# Patient Record
Sex: Male | Born: 1960 | Race: Black or African American | Hispanic: No | Marital: Single | Smoking: Never smoker
Health system: Southern US, Community
[De-identification: ages and names within clinical notes are randomized; demographics above are authoritative.]

## PROBLEM LIST (undated history)

## (undated) DIAGNOSIS — D649 Anemia, unspecified: Secondary | ICD-10-CM

## (undated) DIAGNOSIS — E099 Drug or chemical induced diabetes mellitus without complications: Secondary | ICD-10-CM

## (undated) DIAGNOSIS — Z8619 Personal history of other infectious and parasitic diseases: Secondary | ICD-10-CM

## (undated) DIAGNOSIS — M1990 Unspecified osteoarthritis, unspecified site: Secondary | ICD-10-CM

## (undated) DIAGNOSIS — M109 Gout, unspecified: Secondary | ICD-10-CM

## (undated) DIAGNOSIS — Z8719 Personal history of other diseases of the digestive system: Secondary | ICD-10-CM

## (undated) DIAGNOSIS — N185 Chronic kidney disease, stage 5: Secondary | ICD-10-CM

## (undated) DIAGNOSIS — N2889 Other specified disorders of kidney and ureter: Secondary | ICD-10-CM

## (undated) DIAGNOSIS — K219 Gastro-esophageal reflux disease without esophagitis: Secondary | ICD-10-CM

## (undated) DIAGNOSIS — C9000 Multiple myeloma not having achieved remission: Secondary | ICD-10-CM

## (undated) DIAGNOSIS — T380X5A Adverse effect of glucocorticoids and synthetic analogues, initial encounter: Secondary | ICD-10-CM

## (undated) DIAGNOSIS — Z872 Personal history of diseases of the skin and subcutaneous tissue: Secondary | ICD-10-CM

## (undated) DIAGNOSIS — N135 Crossing vessel and stricture of ureter without hydronephrosis: Secondary | ICD-10-CM

## (undated) DIAGNOSIS — I10 Essential (primary) hypertension: Secondary | ICD-10-CM

## (undated) DIAGNOSIS — I770 Arteriovenous fistula, acquired: Secondary | ICD-10-CM

## (undated) DIAGNOSIS — N289 Disorder of kidney and ureter, unspecified: Secondary | ICD-10-CM

## (undated) HISTORY — PX: LAPAROSCOPIC CHOLECYSTECTOMY: SUR755

## (undated) HISTORY — PX: BONE MARROW BIOPSY: SHX1253

## (undated) HISTORY — PX: DIALYSIS FISTULA CREATION: SHX611

---

## 1997-05-29 HISTORY — PX: KNEE ARTHROSCOPY: SUR90

## 1998-09-24 ENCOUNTER — Emergency Department (HOSPITAL_COMMUNITY): Admission: EM | Admit: 1998-09-24 | Discharge: 1998-09-24 | Payer: Self-pay | Admitting: Emergency Medicine

## 2007-05-26 ENCOUNTER — Ambulatory Visit: Payer: Self-pay | Admitting: Vascular Surgery

## 2007-05-26 ENCOUNTER — Ambulatory Visit: Payer: Self-pay | Admitting: Cardiology

## 2007-05-26 ENCOUNTER — Inpatient Hospital Stay (HOSPITAL_COMMUNITY): Admission: EM | Admit: 2007-05-26 | Discharge: 2007-06-24 | Payer: Self-pay | Admitting: Emergency Medicine

## 2007-05-26 ENCOUNTER — Encounter (INDEPENDENT_AMBULATORY_CARE_PROVIDER_SITE_OTHER): Payer: Self-pay | Admitting: Emergency Medicine

## 2007-05-26 ENCOUNTER — Ambulatory Visit: Payer: Self-pay | Admitting: Cardiovascular Disease

## 2007-05-27 ENCOUNTER — Ambulatory Visit: Payer: Self-pay | Admitting: Oncology

## 2007-05-29 ENCOUNTER — Encounter (INDEPENDENT_AMBULATORY_CARE_PROVIDER_SITE_OTHER): Payer: Self-pay | Admitting: Interventional Radiology

## 2007-05-29 ENCOUNTER — Encounter (INDEPENDENT_AMBULATORY_CARE_PROVIDER_SITE_OTHER): Payer: Self-pay | Admitting: Internal Medicine

## 2007-05-29 HISTORY — PX: TRANSTHORACIC ECHOCARDIOGRAM: SHX275

## 2007-06-03 ENCOUNTER — Ambulatory Visit: Payer: Self-pay | Admitting: Oncology

## 2007-06-07 ENCOUNTER — Other Ambulatory Visit: Payer: Self-pay | Admitting: Nephrology

## 2007-06-08 ENCOUNTER — Encounter: Payer: Self-pay | Admitting: Oncology

## 2007-06-18 ENCOUNTER — Ambulatory Visit: Payer: Self-pay | Admitting: Vascular Surgery

## 2007-06-18 ENCOUNTER — Ambulatory Visit: Payer: Self-pay | Admitting: Physical Medicine & Rehabilitation

## 2007-06-23 ENCOUNTER — Encounter: Payer: Self-pay | Admitting: Oncology

## 2007-06-24 ENCOUNTER — Inpatient Hospital Stay (HOSPITAL_COMMUNITY)
Admission: RE | Admit: 2007-06-24 | Discharge: 2007-07-22 | Payer: Self-pay | Admitting: Physical Medicine & Rehabilitation

## 2007-07-13 ENCOUNTER — Ambulatory Visit: Payer: Self-pay | Admitting: Hematology & Oncology

## 2009-06-15 IMAGING — US US RENAL
2 series · 14 of 25 positions shown · non-contrast
Comparison: 05/28/2007

CLINICAL DATA: Anemia. Hematuria. Lung mass. Multiple myeloma.

RENAL/URINARY TRACT ULTRASOUND
TECHNIQUE: Complete ultrasound examination of the urinary tract was performed
including evaluation of the kidneys, renal collecting systems, and urinary
bladder.

[Series 1: unknown · 0.40mm/px · 13 of 37 slices shown (1 of 2)]
[im 1/37]
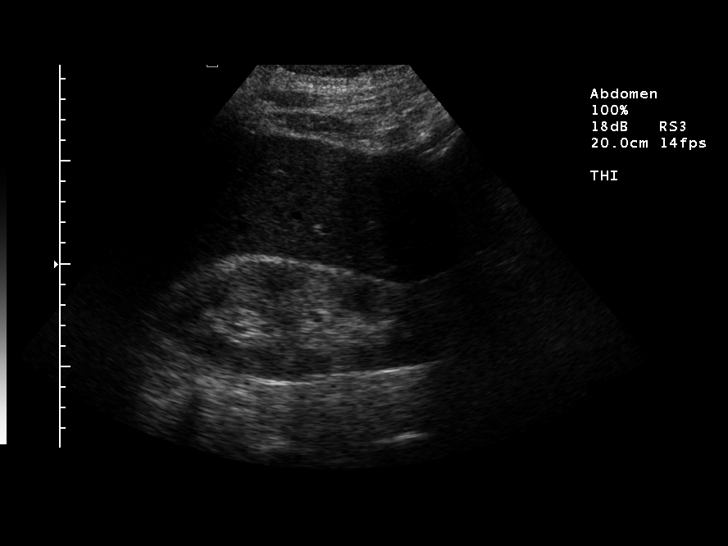
[im 4/37]
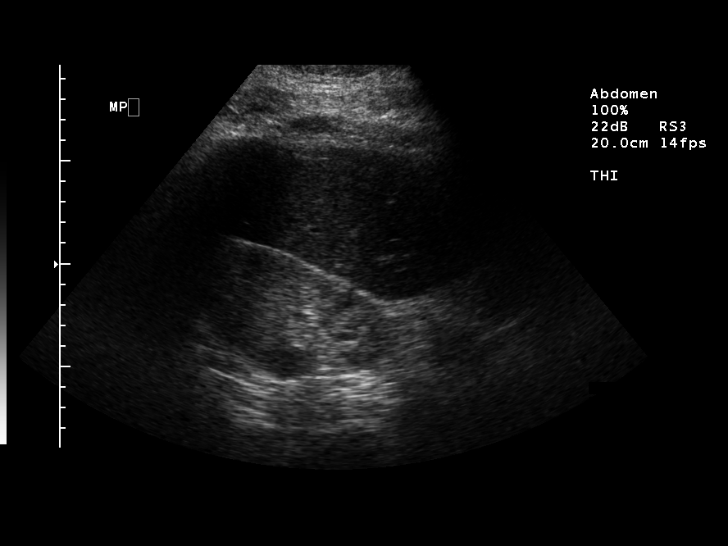
[im 7/37]
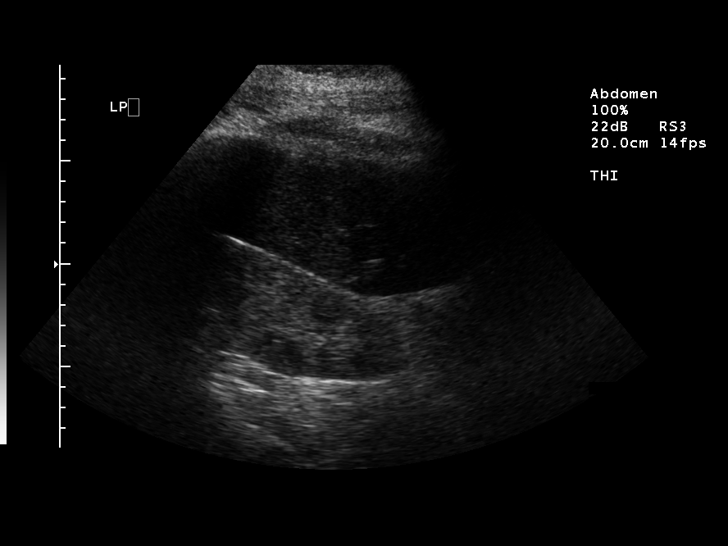
[im 10/37]
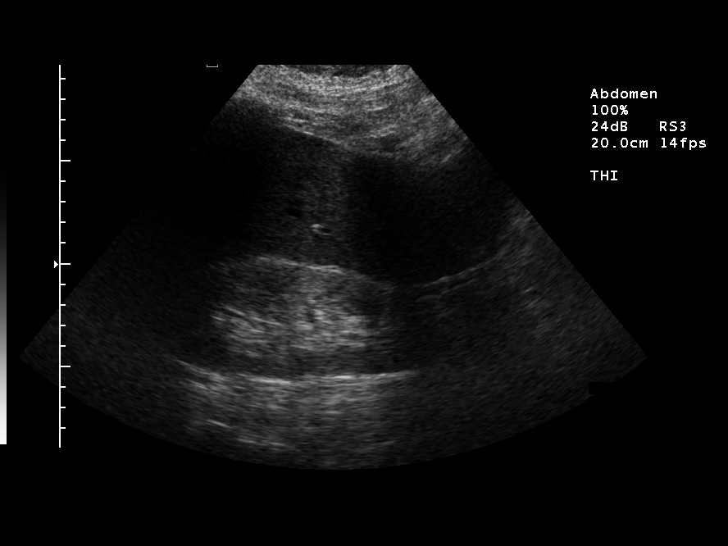
[im 14/37]
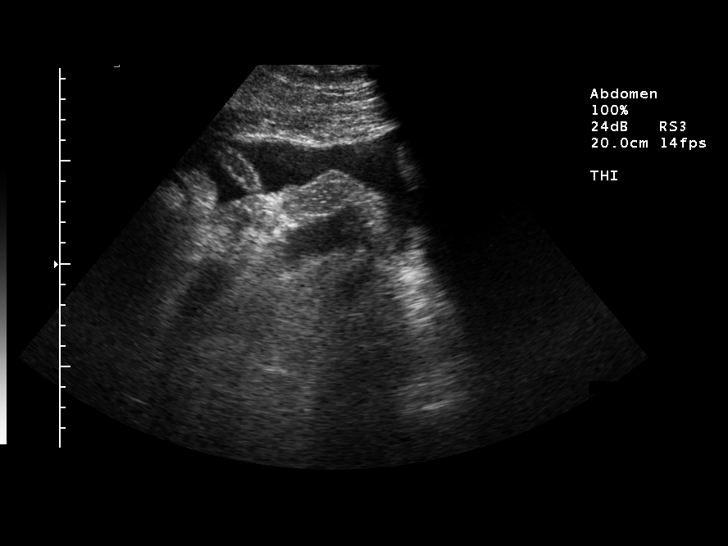
[im 15/37]
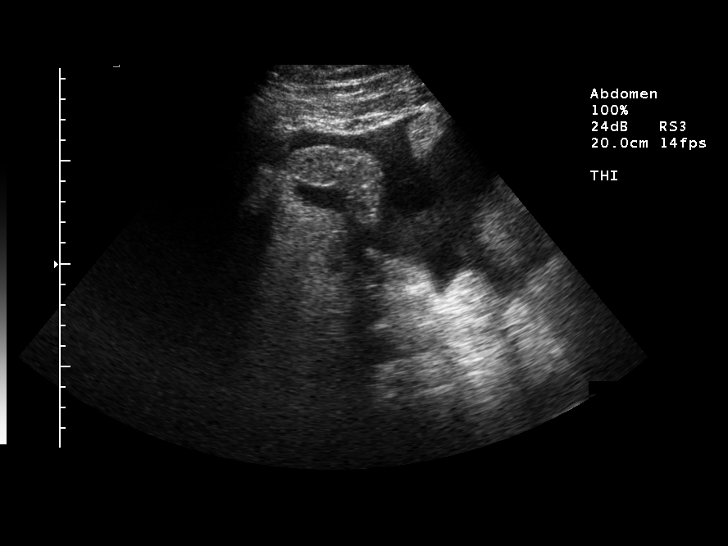
[im 19/37]
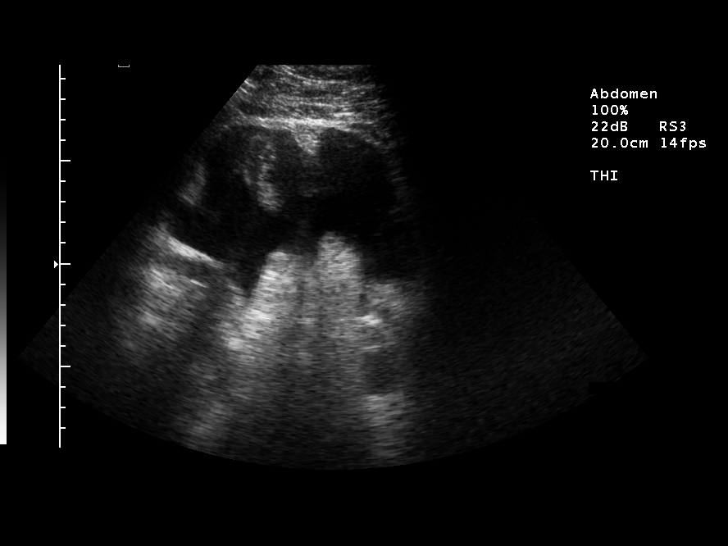
[im 22/37]
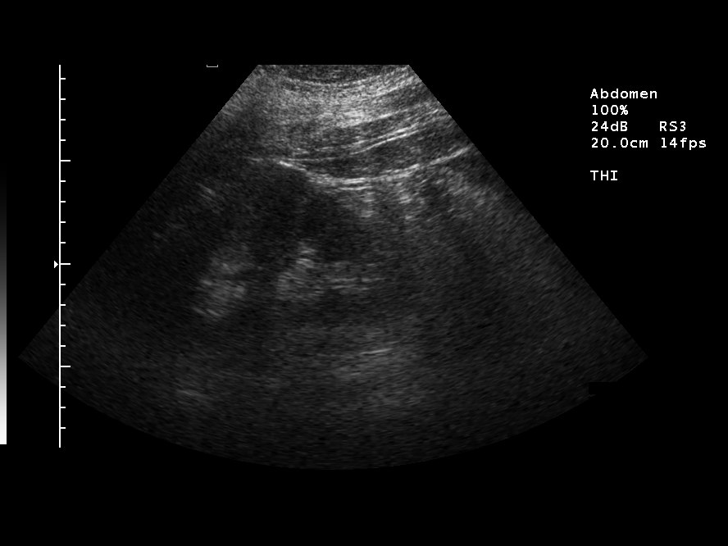
[im 25/37]
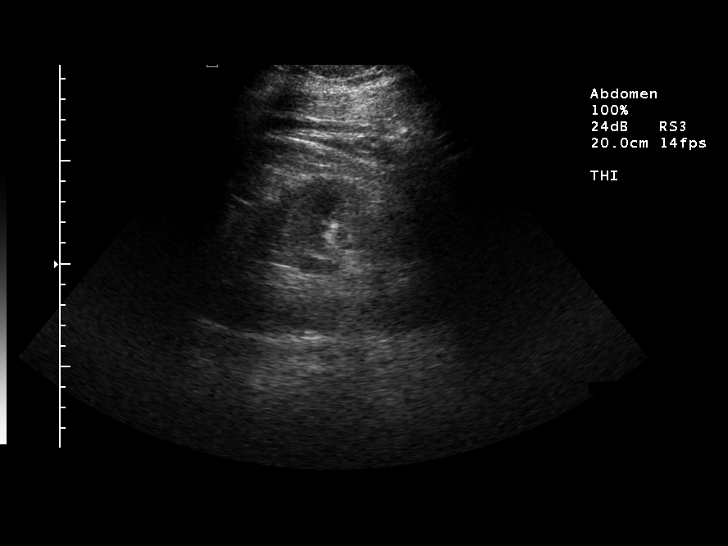
[im 27/37]
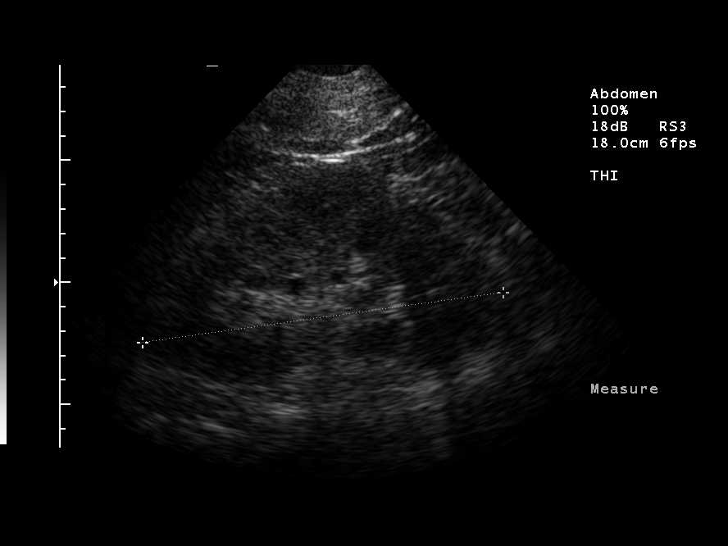
[im 30/37]
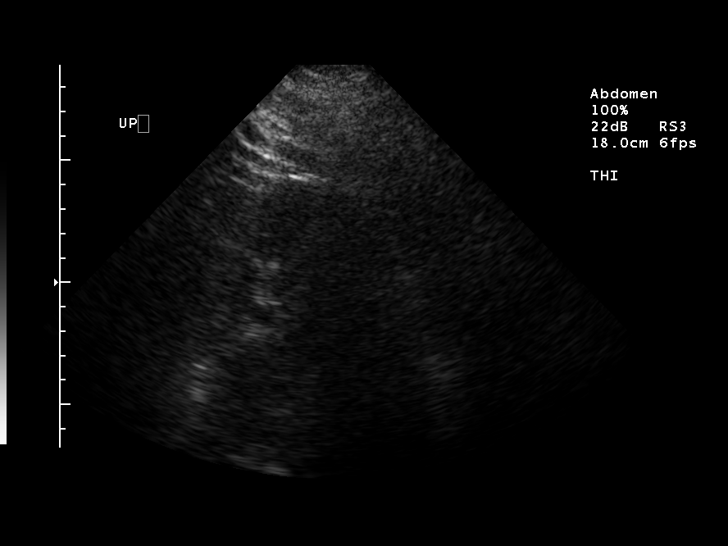
[im 33/37]
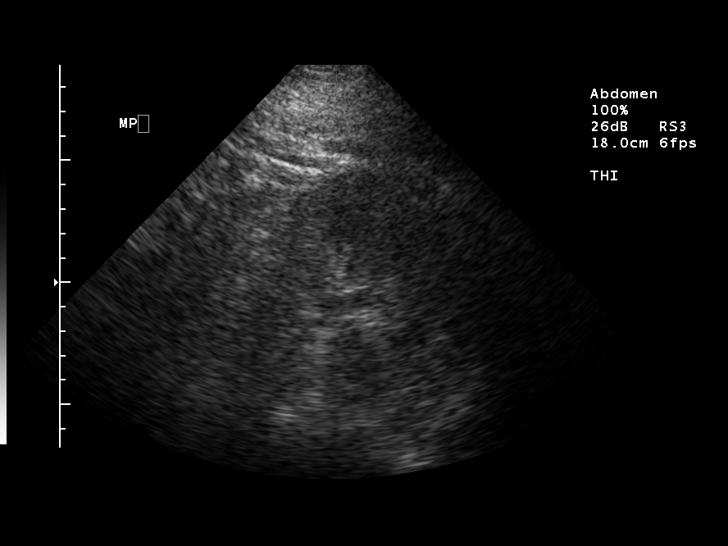
[im 37/37]
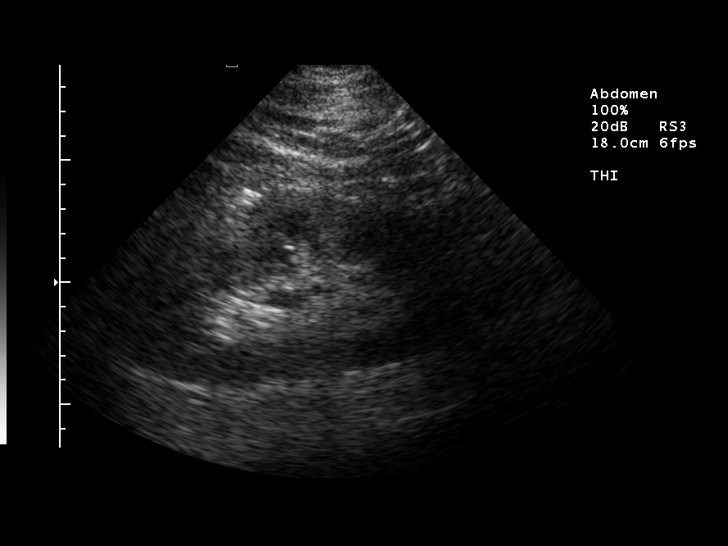

[Series 1: unknown · 0.38mm/px · 1 of 4 slices shown (2 of 2)]
[im 4/4]
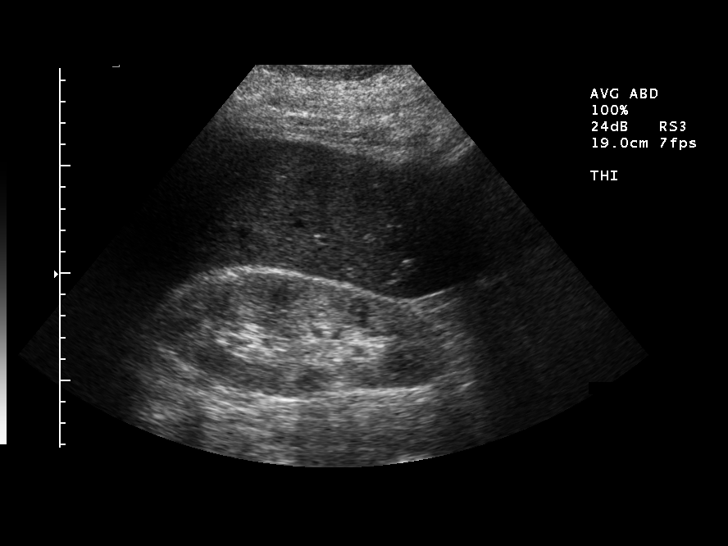

[14 of 25 positions shown; findings below may reference images not displayed]

FINDINGS: right kidney 14.9 and left kidney 14.9 cm. No hydronephrosis.
Increased echogenicity of the kidneys bilaterally.

Foley catheter within the urinary bladder. Small moderate amount of ascites.

IMPRESSION

1. No hydronephrosis.
2. Echogenic likely enlarged kidneys. Differential considerations include HIV
nephropathy, acute glomerulonephritis, amyloid deposition and acute diabetes.
3. Ascites.

## 2009-07-03 IMAGING — CR DG HIP 1V PORT*L*
1 series · 1 of 1 positions shown · non-contrast
Comparison: 05/26/07.

CLINICAL DATA: Evaluate for fracture.  Anemia, lung mass.  
 LEFT HIP ? 1 VIEW:

[view not recorded]
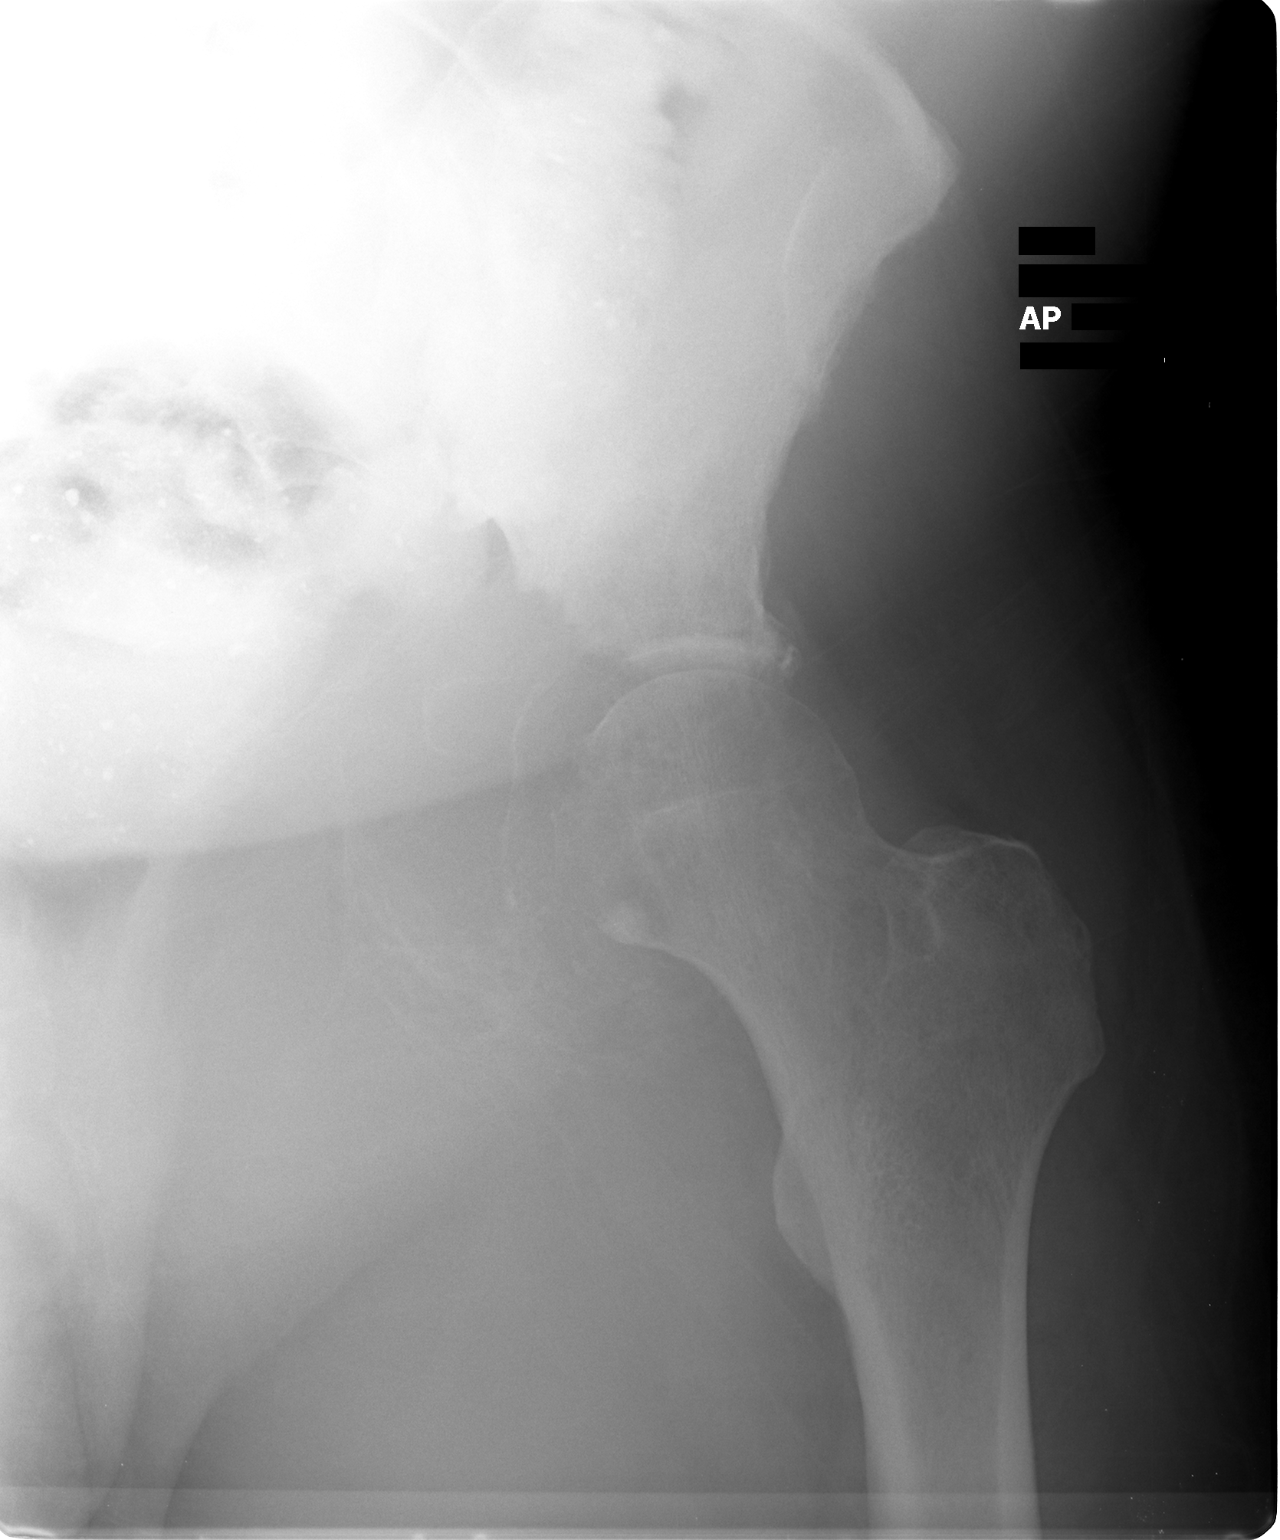

[1 of 1 positions shown; findings below may reference images not displayed]

FINDINGS: There is lucency in the left acetabulum and left obturator ring, making evaluation for fracture nearly impossible.  Lucency is also seen in the left femoral head.  The mineralized portions of the left femur are unremarkable.
IMPRESSION: Metastatic disease involving the pelvis and left femoral head markedly limits evaluation for fracture.

## 2009-07-04 IMAGING — CR DG CHEST 2V
1 series · 1 of 1 positions shown · non-contrast
Comparison: 06/03/2007.

CHEST - 2 VIEW
CLINICAL DATA: Fever. Multiple myeloma. End-stage renal disease.

[view not recorded]
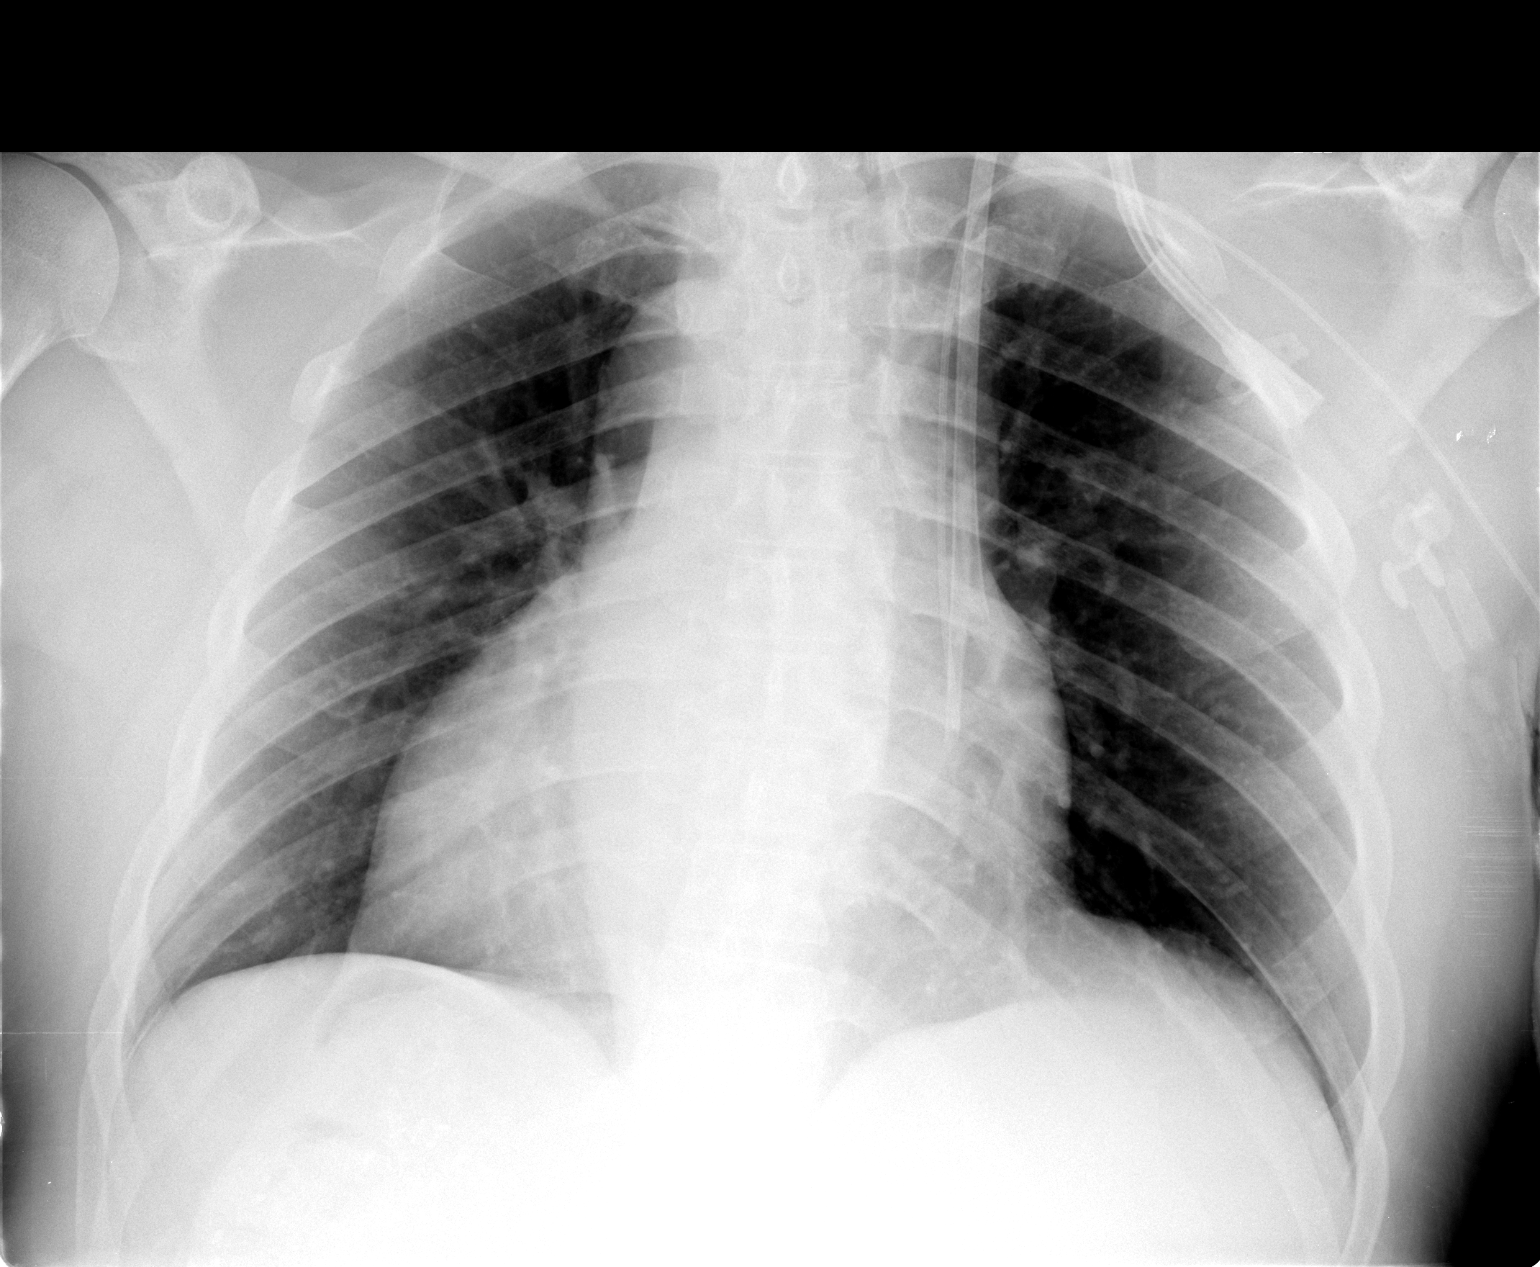

[1 of 1 positions shown; findings below may reference images not displayed]

FINDINGS: Right IJ dialysis catheter is present, with distal tip at the
junction of the superior vena cava and right atrium.
Right upper extremity PICC terminates in the lower SVC.
Cardiomegaly.
Unchanged mediastinal contours.
No pleural effusion.
No edema or airspace disease identified.

**********************************************************************
IMPRESSION

1. Right upper extremity and right IJ dialysis catheter without complication.
2. Cardiomegaly without acute cardiac pulmonary disease. 
**********************************************************************

## 2010-06-19 ENCOUNTER — Encounter: Payer: Self-pay | Admitting: Nephrology

## 2010-10-11 NOTE — Op Note (Signed)
NAMEHAWTHORNE, [name]             ACCOUNT NO.:  [id number]   MEDICAL RECORD NO.:  [id number]          PATIENT TYPE:  INP   LOCATION:  6706                         FACILITY:  [facility]   PHYSICIAN:  Di Kindle. Edilia Bo, M.D.DATE OF BIRTH:  05-11-1961   DATE OF PROCEDURE:  06/21/2007  DATE OF DISCHARGE:                               OPERATIVE REPORT   PREOPERATIVE DIAGNOSIS:  Chronic renal failure.   POSTOPERATIVE DIAGNOSIS:  Chronic renal failure.   PROCEDURE:  Placement of a left forearm AV fistula.   SURGEON:  Di Kindle. Edilia Bo, M.D.   ASSISTANT:  Nurse.   ANESTHESIA:  Local with sedation.   TECHNIQUE:  The patient was taken to the operating room and sedated by  anesthesia.  The left upper extremity was prepped and draped in the  usual sterile fashion.  An oblique incision was made at the left wrist  where the cephalic vein was dissected free with branches divided between  clips and 3-0 silk ties.  The radial artery was dissected free beneath  the fascia.  The artery had a good pulse with no significant plaque.  The vein was somewhat small.  It was ligated distally and irrigated up  with heparinized saline.  It was about 3-4 mm vein.  The patient was  heparinized.  The radial artery was clamped proximally and distally and  a longitudinal arteriotomy was made.  The vein was then spatulated and  sewn end-to-side to the artery using continuous 6-0 Prolene suture.  At  the completion, there was a good thrill in the fistula.  Hemostasis was  obtained in the wound.  The wound was closed with a deep layer of 3-0  Vicryl.  The skin closed with 4-0 Vicryl.  Sterile dressing was applied.  The patient tolerated the procedure well and was transferred to the  recovery room in satisfactory condition.  All needle and sponge counts  were correct.      Di Kindle. Edilia Bo, M.D.  Electronically Signed     CSD/MEDQ  D:  06/21/2007  T:  06/21/2007  Job:  [id number]

## 2010-10-11 NOTE — Consult Note (Signed)
[name], [name]             ACCOUNT NO.:  [id number]   MEDICAL RECORD NO.:  [id number]          PATIENT TYPE:  INP   LOCATION:  1429                         [facility]AC[facility]   PHYSICIAN:  Casimiro Needle B. Sherene Sires, MD, FCCPDATE OF BIRTH:  [birth year]   DATE OF CONSULTATION:  DATE OF DISCHARGE:                                 CONSULTATION   REFERRING PHYSICIAN:  Della Goo, M.D.   REASON FOR CONSULTATION:  Possible lung mass.   HISTORY OF PRESENT ILLNESS:  This is a 50 year old black male who  reports that he never smoked cigarettes and been having increasing  problems with coughing and congestion for several weeks (since  Thanksgiving) with minimal mucoid sputum and generalized chest  discomfort anteriorly during coughing paroxysms like a bad cold.  He  was seen for this problem in the emergency room and found to have a  density that suggested pneumonia versus lung mass.  He also complained  of left hip pain was found to have a lytic lesion in the left hip and  therefore is being worked up for this as well.   The patient denies any history of smoking or unusual exposure history.  He also denies a history of hemoptysis or lateralizing pain, purulent  sputum, active sinus complaints.  He has had mild fevers and chills but  no rigors and no documented fever here in the hospital.   PAST MEDICAL HISTORY:  Hypertension, not treated.  Operations:  None.   MEDICATIONS:  Robitussin DM.   ALLERGIES:  NONE KNOWN.   SOCIAL HISTORY:  He has never smoked as noted above.  Also denies  alcohol use or HIV exposure.   FAMILY HISTORY:  Family history is positive for ischemic heart disease  in his mother; diabetes in his father and also lung cancer in his  father.   REVIEW OF SYSTEMS:  Review of systems is taken in detail and negative  except as above.   PHYSICAL EXAMINATION:  GENERAL:  This is a black male who appears  uncomfortable but not toxic, who answers questions in a very slow  and  reserve fashion.  VITAL SIGNS:  He is afebrile with normal vital signs.  HEENT:  Unremarkable.  Pharynx is clear.  Dentition intact.  NECK:  Supple without cervical adenopathy or tenderness.  His trachea is  in the midline, no thyromegaly.  CHEST:  Lung fields reveal diminished breath sounds but no wheezing,  localized, generalized or otherwise.  CARDIOVASCULAR:  Regular rate and rhythm without murmur, rub or  increased P2.  ABDOMEN:  Abdomen is soft with no palpable organomegaly, mass or  tenderness.  EXTREMITIES:  Warm without calf tenderness, cyanosis, clubbing or edema.   LABORATORY DATA:  Lab data included an admission hemoglobin of only 5.6  with an MCV of 89 with no significant leukocytosis and he has also had a  normal PSA and TSH, and negative stool for occult blood since admission.   Chest x-ray was not available for review on the PAK system as is not  functioning.  The report indicates a density in the left lung consistent  with  either pneumonia or lung mass and there is a lucent lesion in the  left pelvis.   IMPRESSION:  The clinical history is quite confusing but somewhat  suggestive of a recent upper respiratory tract infection complicated by  a community-acquired pneumonia in a patient who has not been healthy  because of chronic hypertension leading to renal insufficiency and  anemia.  He does not have a risk factor for lung cancer other than the  fact that his father developed lung cancer but was a smoker.  Because he  has enough evidence to support a diagnosis of pneumonia and enough other  problems to work on acutely, I have recommended treating him acutely for  pneumonia with Avelox for 7 days and then following him in the office  assuming he is able to be discharged, to make sure that this density  clears.  If not, bronchoscopy can certainly be considered.  In the  meantime, his chronic problems of anemia, renal insufficiency and hip  pain with lytic  lesion can be worked up as an inpt independent of the  pulmonary process, which has yet to be specifically defined.   I have discussed all this with him openly and written my name on the  discharge planning sheet so that he can contact me for follow-up at the  time of discharge.  However, no further pulmonary intervention is  indicated acutely during this admission.      Charlaine Dalton. Sherene Sires, MD, [facility]  Electronically Signed     MBW/MEDQ  D:  05/28/2007  T:  05/29/2007  Job:  [id number]

## 2010-10-11 NOTE — H&P (Signed)
NAMECERVANDO, [name]             ACCOUNT NO.:  [id number]   MEDICAL RECORD NO.:  [id number]          PATIENT TYPE:  INP   LOCATION:  1429                         FACILITY:  [facility]   PHYSICIAN:  Della Goo, M.D. DATE OF BIRTH:  06/13/1960   DATE OF ADMISSION:  05/26/2007  DATE OF DISCHARGE:                              HISTORY & PHYSICAL   This is an unassigned patient.   CHIEF COMPLAINT:  Shortness of breath.   HISTORY OF PRESENT ILLNESS:  This is a 50 year old male presenting to  the emergency department with complaints of severe shortness of breath,  fatigue, that he reports he has been having for 2 weeks.  The patient  also reports having increased left-sided hip pain during this time, as  well, but he reports having left-sided hip pain for approximately 1-1/2  years.  He also reports having head cold symptoms, cough, and  congestion, and has been taking over-the-counter Robitussin for the past  week.  He reports having soreness in his abdominal muscles secondary to  coughing.  He also reports having fevers, chills, and sweats.  He denies  having any loss of appetite or any weight loss.  The patient also denies  having any nausea, vomiting, diarrhea, or chest pain..  The patient  denies having any vomiting, hematemesis, hematochezia, or any melena  passage.   PAST MEDICAL HISTORY:  Hypertension, untreated.   PAST SURGICAL HISTORY:  None.   MEDICATIONS AT THIS TIME:  Over-the-counter Robitussin DM.   ALLERGIES:  NO KNOWN DRUG ALLERGIES.   SOCIAL HISTORY:  The patient is a nonsmoker, nondrinker.   FAMILY HISTORY:  Positive for coronary artery disease, hypertension in  his mother, positive for diabetes mellitus in his father, and positive  for cancer.  His father had lung cancer.   REVIEW OF SYSTEMS:  Pertinent for mentioned above.   PHYSICAL EXAMINATION FINDINGS:  GENERAL:  This is a 50 year old obese  male in discomfort but no acute distress.  VITAL SIGNS:   Temperature 100.1, blood pressure 200/133, heart rate 118,  respirations 28, O2 saturations 97% initially.  HEENT:  Normocephalic, atraumatic.  Pupils equally round, reactive to  light.  Extraocular muscles are intact.  Funduscopic benign.  Oropharynx  is clear.  NECK:  Supple, full range of motion.  No thyromegaly, adenopathy,  jugular venous distention.  CARDIOVASCULAR:  Tachycardic rate and rhythm.  No murmurs, gallops, or  rubs.  LUNGS:  Occasional expiratory wheezes present.  No rales, positive  rhonchi present.  ABDOMEN:  Positive bowel sounds, soft, nontender, nondistended.  EXTREMITIES:  Without cyanosis, clubbing, or edema.  NEUROLOGIC:  Alert and oriented x3.  Nonfocal.   LABORATORY STUDIES:  White blood cell count 7.2, hemoglobin 5.6,  hematocrit 16.3, platelets 193.  Pro time 12.1, INR 1.4, PTT 35.  Sodium  128, potassium 3.8, chloride 104, bicarb 24, BUN 16, creatinine 2.51,  and glucose 100.  Cardiac enzymes:  Myoglobin greater than 500, CK-MB  6.9, and troponin less than 0.05.  Chest x-ray findings reveal an  opacity in the left mid lung, and x-rays of the pelvis and left hip  reveal a lucent lesion of the left innominate bone.   ASSESSMENT:  A 50 year old male being admitted with:  1. Shortness of breath.  2. Severe anemia.  3. Lung mass left midlung.  4. Lytic left hip lesion.  5. Hyponatremia.  6. Malignant hypertension.  7. Chronic renal insufficiency.   PLAN:  The patient will be admitted to the telemetry area.  Cardiac  enzymes will continue to be performed.  The patient will be transfused 4  units of packed red blood cells and an anemia workup will be started.  An oncologic workup has also been started.  A PSA will be checked.  An  imaging scan will be performed to find a possible primary source, a bone  scan or a PET scan.  Pain control therapy has been ordered along with  deep venous thrombosis and gastrointestinal prophylaxis, and  supplemental oxygen  therapy.  Empiric antibiotic therapy has also been  started with azithromycin.  Blood pressure control medications will be  started, as well.  Pending results of his oncologic workup, further  consultations will be made.      Della Goo, M.D.  Electronically Signed     HJ/MEDQ  D:  05/27/2007  T:  05/28/2007  Job:  [id number]

## 2010-10-11 NOTE — Consult Note (Signed)
[name], [name]             ACCOUNT NO.:  [id number]   MEDICAL RECORD NO.:  [id number]          PATIENT TYPE:  INP   LOCATION:  1429                         FACILITY:  [facility]   PHYSICIAN:  Genene Churn. Granfortuna, M.D.DATE OF BIRTH:  12/12/1960   DATE OF CONSULTATION:  05/31/2007  DATE OF DISCHARGE:                                 CONSULTATION   REASON FOR CONSULTATION:  This is a hematology/oncology consultation to  evaluate this man for newly diagnosed multiple myeloma.   HISTORY:  [name] is a 50 year old man who does not have a regular  physician.  He presented to the emergency department on the day of  admission, May 26, 2007, with a 1 month history of progressive  dyspnea and a 1 week history of a productive cough and swelling of his  left lower extremity.  Initial laboratory value showed the presence of a  severe anemia with a hemoglobin of 5.6 grams, MCV 89.  A chemistry  profile showed renal dysfunction with a creatinine of 2.5 with BUN 16,  marked elevation of serum total protein at 13.6 grams percent,  hypercalcemia with calcium 10.5 and hypoalbuminemia of 2.4 grams percent  making corrected calcium closer to 12.  The patient also complained of  left hip pain.  Regular x-rays of the hip showed a large lytic lesion  replacing the entire left innominate bone extending to the superior  pubic ramus on the right.  Subsequent CT scan of the chest, abdomen and  pelvis done without contrast confirmed the large left hip lesion causing  almost complete destruction of the acetabulum.  There was expansion of  the marrow space with only a thin rim of residual periosteum identified.  Additional lesions were seen in the right pubic bone and high right  iliac bone.  CT scan of the chest showed no pulmonary parenchymal  lesions.  A number of lesions were seen in the cervical and thoracic  spine at C7 vertebral body and transverse process, T3 vertebral body, T8  vertebral body  where there was a 1.8 x 2.5 cm lytic lesion, and T10  vertebral body where there was a small 1.4 lesion.  Likely lesion in the  left humeral head 0.9 cm.   A serum protein electrophoresis shows a monoclonal protein accounting  for 7 grams of the total 7.6 grams of protein in the gamma region  consistent with a monoclonal gammopathy.  Immunofixation electrophoresis  and serum immunoglobulin studies are pending.   A needle biopsy of the lytic lesion in the left hip was done on December  31.  I have reviewed the specimen with the pathologist.  This shows a  heavy infiltrate involving almost the entire bone marrow space  consisting of plasma cells diagnostic for multiple myeloma.   A renal ultrasound shows large echogenic kidneys but no obstruction or  hydronephrosis.  No focal abnormalities.  A bone scan shows the presence  of the expansile lesion in the left ischium which has only low level  uptake which is typical of multiple myeloma.  There is focal uptake  within the manubrium.  The  other lesions seen on the CT scan in the  spine do not show up on a bone scan.   PAST MEDICAL HISTORY:  No history of hypertension until he was admitted  to the hospital and found to have hypertension.  No prior MI.  No known  lung disease.  No ulcers.  No history of hepatitis, yellow jaundice,  kidney stones, thyroid trouble, blood clots, previous bleeding  disorders.  Only prior surgery was arthroscopic surgery on his left knee  about 10 years ago.  He was on no medications prior to the admission  except for some p.r.n. ibuprofen.   HOSPITAL MEDICATIONS:  Aspirin 81 mg, clonidine 0.1 mg b.i.d., Duragesic  12 mcg q. 3 days, Lasix 20 mg daily, hydralazine 10 mg t.i.d. started  today, Lopressor 50 mg b.i.d., Avelox 400 mg p.o. daily, nitroglycerin  patch 1 inch q.6 h, Protonix 40 mg b.i.d., p.r.n. bronchodilators.   ALLERGIES:  No allergies.   FAMILY HISTORY:  Father died of lung cancer when he was in  his early  81s.  Mother is still alive and well.  He had one brother who died  because he did not take care of himself, another brother has mental  problems and is currently institutionalized at [facility].  Three sisters  who are alive and well.   SOCIAL HISTORY:  He is an out of work Administrator.  No alcohol or  tobacco.  No toxic chemical or radiation exposures.  He lives with his  mother and is not married.  No children.   REVIEW OF SYSTEMS:  Generalized weakness but no anorexia, weight loss or  fevers.  He has had a cough for the week prior to the admission, felt  hot, did not take his temperature.  No dysphagia.  No chest pain.  No  palpitations.  No abdominal pain.  He has been constipated for the last  week which is consistent with his hypercalcemia.  No urinary tract  problems.  He denied any focal weakness of his legs, said the main  problem with pain and swelling.  Denied any paresthesias.   PHYSICAL EXAMINATION:  GENERAL:  Obese African American man in no acute  distress.  VITAL SIGNS:  Height and weight are not recorded.  Current blood  pressure is 164/94, pulse 96 regular, respirations 16, temperature 98.3.  SKIN, HAIR, NAILS:  Normal.  No ecchymosis, petechiae or rash.  HEENT:  Pupils are pinpoint from narcotics.  Pharynx, no erythema,  exudate or mass.  NECK:  Supple.  No thyromegaly.  LUNGS:  Overall clear.  Resonant to percussion.  CARDIAC:  Regular cardiac rhythm.  No murmur, gallop or rub.  ABDOMEN:  The abdomen is soft, obese, nontender.  No mass.  No  organomegaly.  EXTREMITIES:  With 1 to 2+ edema of the ankles up to the knee, left  greater than right.  Calves are supple and nontender.  NEUROLOGIC:  Mental status intact.  Cranial nerves grossly normal.  Motor strength 5/5 except for the left lower extremity where exam is  limited by pain.  He cannot flex his leg against gravity or resistance.  Reflex is intact at the knee and symmetric with the right knee  reflex.   IMPRESSION:  Multiple myeloma, advanced stage, with multiple lytic bone  lesions, renal insufficiency, hypercalcemia, hypoalbuminemia.  Subtype  to be determined by outstanding special lab studies.   RECOMMENDATIONS:  1. I would proceed with saline hydration despite his hypertension.  I  discussed this with cardiology.  Dr. Diona Browner will add hydralazine      to his regimen.  Once the patient is hydrated then we can add Lasix      in based on daily input and output assessment.  I have written some      orders to cover this.  2. He has an unstable left hip and I would ask orthopedic surgery to      evaluate the situation for any kind of a stabilization procedure.      The lesion is so far advanced, however, he might not be a candidate      for surgical repair.  In that case I would ask radiation oncology      for an opinion re palliative radiation to that area.  I would also      go ahead and get an MRI of his spine to assess the lesions in his      vertebrae to make sure that none of these is unstable or threaten      his spinal cord.  3. I agree with bisphosphonate use to lower his calcium.  4. I will initiate therapy with high dose pulse dexamethasone 40 mg      daily x4 days.  Once he is medically stable and is discharged I      will add lenalidomide to his regimen.  On the short term there are      very high responses to the combination of dexamethasone plus      Revlimid, up to 80% of patients responding.  Given his young age,      following three to four cycles of induction he will be referred to      [facility] for consideration of high dose IV melphalan      with autologous stem cell support.   Thank you for this consultation.  I will follow the patient closely with  you.      Genene Churn. Cyndie Chime, M.D.  Electronically Signed     JMG/MEDQ  D:  05/31/2007  T:  05/31/2007  Job:  [id number]   cc:   Lonia Blood, M.D.

## 2010-10-11 NOTE — Consult Note (Signed)
[name], [name]             ACCOUNT NO.:  [id number]   MEDICAL RECORD NO.:  [id number]          PATIENT TYPE:  INP   LOCATION:  1429                         FACILITY:  [facility]   PHYSICIAN:  Arturo Morton. Riley Kill, MD, FACCDATE OF BIRTH:  May 08, 1961   DATE OF CONSULTATION:  05/28/2007  DATE OF DISCHARGE:                                 CONSULTATION   CHIEF COMPLAINT:  Shortness of breath.   HISTORY OF PRESENT ILLNESS:  [name] is a 50 year old gentleman  with no prior recent medical evaluation.  He does have a history of  untreated hypertension.  The patient presented with increasing shortness  of breath and was found to be significantly hypertensive.  Moreover, the  patient was noted to have a profound anemia, left lung mass, and a lytic  bone lesion in the left hip.  He also has evidence of some renal  insufficiency with creatinines up to 2.3.  The anemia was profound.  Workup has been undertaken, but no current radiologic image information  is available as the system is down.  The patient has been receiving  medications for hypertension.  Of note, the patient has normal CPK and  MB, but the troponins have been elevated at 0.22, 0.13, and 0.11.  Overall CK (MB) is normal.  Electrocardiograms have been within normal  limits.  He has undergone transfusion, and his hemoglobin has been  slowly increasing.  The patient does have an elevated sedimentation rate  of 118.   The patient's symptoms began about 4 to 6 weeks ago and have been  predominantly noted with increasing shortness of breath.  He has had  discomfort in the left knee for several days, which has gotten slightly  worse.  He has also had hip pain.   PAST MEDICAL HISTORY:  Remarkable for untreated hypertension.  He has  had no surgeries.  There is no current allergies, and he was on no  medications.  He is now on clonidine, a nitroglycerin patch, and  Zithromax.  Protonix has been added as well as metoprolol.   FAMILY HISTORY:  Positive for coronary disease and hypertension in his  mother.  Positive for diabetes in his father.  His father had lung  cancer.   The patient is a nonsmoker.  He is around secondhand smoke.  He also  does not drink.   REVIEW OF SYSTEMS:  Remarkable for the effusion in the left knee, which  has gotten slightly worse since Saturday.   PHYSICAL EXAMINATION:  On examination, he is alert and oriented.  The  blood pressure is 180/122, the pulse is 96, the temperature is 97.6, and  respiratory rate is 20.  The lung fields are clear although there is  adventitial sounds in the right lung.  The patient had to roll on his  side.  The PMI is not obviously displaced, and there is a normal first  and second heart sound with slightly distant sounds.  The abdomen is  soft without obvious mass.  There is an effusion involving the left  knee.  The extremities reveal no definite edema.   TEST  DATA:  An electrocardiogram is done and reveals normal sinus rhythm  without acute ST-T wave abnormalities.   Laboratory studies are as noted.   IMPRESSION:  1. Lung mass with profound normocytic anemia.  2. Mildly increased troponin of uncertain etiology.  3. Severe hypertension, untreated.   RECOMMENDATIONS:  1. I agree with the up-titration of his blood pressure medications.      He is currently on metoprolol and also receiving clonidine.  He is      not bradycardic, but there will need to be some care with regard to      watching the interaction of these two drugs.  2. A 2-D echocardiogram would be recommended to assess overall LV      function, especially as the patient's hematocrit is more      normalized.  3. The patient is undergoing extensive evaluation, and we will follow      the patient with you as we proceed.  4. Some consideration may be given to tapping the left knee effusion.      Arturo Morton. Riley Kill, MD, [facility]  Electronically Signed     TDS/MEDQ  D:  05/28/2007   T:  05/29/2007  Job:  [id number]

## 2010-10-11 NOTE — Consult Note (Signed)
[name], [name]             ACCOUNT NO.:  [id number]   MEDICAL RECORD NO.:  [id number]          PATIENT TYPE:  IPS   LOCATION:  4140                         FACILITY:  [facility]   PHYSICIAN:  Jefry H. Pollyann Kennedy, MD     DATE OF BIRTH:  1960/08/22   DATE OF CONSULTATION:  06/29/2007  DATE OF DISCHARGE:                                 CONSULTATION   REASON FOR CONSULTATION:  Epistaxis.   HISTORY OF PRESENT ILLNESS:  This is a 50 year old recently diagnosed  with multiple myeloma. He is on the rehabilitation unit. He has been  having recurring epistaxis for the past several days, mainly from the  right side.   PAST MEDICAL HISTORY:  Significant for recently diagnosed multiple  myeloma. He is not on any blood thinner, though he does have some  evidence of platelet dysfunction and has had a slightly low platelet  count, the most recent one being about 80,000.   PHYSICAL EXAMINATION:  GENERAL:  A healthy appearing gentleman, who  seems somewhat lethargic.  HEENT:  Oral cavity and pharynx are clear except for some old blood  draining down the posterior pharynx.  NECK:  No palpable masses.   PROCEDURE:  The nasal examination reveals dried blood, crusting, and  scabbing bilaterally that was cleaned out. The right nasal cavity is  filled with blood clot, that I was able to partially evacuate. Topical  Afrin was applied to the nasal cavities for decongestant. Further  inspection revealed no active bleeding on either side, although the  large clot adhering on the right side seemed to be adjacent to the  source of the bleeding. I was not able to evacuate the clot completely  and therefore, was not able to visualize the source of the bleeding on  the right side. The left side, I was able to clear out completely. There  was no active bleeding. The right side was anesthetized with injection  of Xylocaine with epinephrine into the septum and the inferior middle  turbinates. A Mersol pack was then  placed and inflated with the local  anesthetic solution. He is on an antibiotic and will continue to be on  that. I will remove the packing in 5 days if there is no further  bleeding or see him back sooner if there is additional bleeding. If his  condition warrants, he may be discharged to home in the meantime and I  will see him back as an outpatient in 5 days. Otherwise, we will see him  in the hospital.      Jefry H. Pollyann Kennedy, MD  Electronically Signed     JHR/MEDQ  D:  06/29/2007  T:  06/30/2007  Job:  [id number]

## 2010-10-11 NOTE — Consult Note (Signed)
NAMETAMARICK, KOVALCIK             ACCOUNT NO.:  [id number]   MEDICAL RECORD NO.:  [id number]          PATIENT TYPE:  IPS   LOCATION:  4140                         FACILITY:  [facility]   PHYSICIAN:  Genene Churn. Granfortuna, M.D.DATE OF BIRTH:  [birth year]   DATE OF CONSULTATION:  06/25/2007  DATE OF DISCHARGE:                                 CONSULTATION   This is a hematology consultation requested to assist in management with  this patient with known multiple myeloma just transferred from acute  medicine to the rehabilitation service yesterday.   Mr. Vespa is an unfortunate 50 year old man who had not been under  medical care for many years.  He presented to the [facility] emergency  department on May 27, 2007 with complaints of cough, fever,  dyspnea, and swelling of the left lower extremity and left hip pain as  the first sign of a new diagnosis of multiple myeloma.  Please see my  May 28, 2007 consultation note and my June 20, 2007 extensive  discharge summary for full details.   Briefly, he was found to be severely anemic.  He had a marked elevation  of serum total protein, the majority of which was gamma globulin  accounting for over 10 grams of protein per deciliter of blood, renal  insufficiency with initial creatinine of 2.5, malignant hypercalcemia  with initial calcium values 12.  Bone x-ray showed extensive destructive  lesions, with the most advanced being in the left innominate bone which  was almost completely destroyed.  There is extension of disease to the  right pubic bone.  There were a number of additional lesions in his  spine, the most advanced lesion causing partial collapse, and some  extradural extension was at T3.   Initial orthopedic consultation did not feel that he was a suitable  candidate for a surgical stabilization procedure of the left hip due to  the extensive disease there.  He received palliative radiation to the  left hip and the T  spine area.   He developed rapidly progressive renal failure requiring initiation of  dialysis which he continues at this time.  A right subclavian dialysis  catheter was placed for temporary access.  An arteriovenous shunt was  placed in the left forearm on June 21, 2007 in anticipation of the  need for long-term dialysis.   Initial hypercalcemia was treated aggressively with fluids, Lasix,  pamidronate, and calcitonin, and calcium levels have returned to low-  normal.   He developed an extensive deep venous thrombosis of the left lower  extremity and was put on full-dose anticoagulation with low molecular  weight heparin, current dose 100 mg daily.   Treatment for his myeloma was initiated with an initial 4-day course of  high-dose dexamethasone 40 mg x4 days.  He was then started on oral  Alkeran 9 mg per meter squared x4 days each month.  Decadron to continue  at a dose of 40 mg weekly.  Plan is to also add Revlimid when the drug  is available.  This will be given as a 15 mg oral dose following each  dialysis  treatment 3times a week, 3 weeks on with 1 week rest.   At this point, his acute medical problems have been stabilized, and I  have requested rehabilitation medicine assistance in gait training and  transfers.  Orthopedic consultant recommended that he be  nonweightbearing on his left leg due to the extensive bone disease in  the left hip and acetabulum.   PAST MEDICAL HISTORY:  Remarkable only for untreated essential  hypertension which was also a problem addressed at time of the May 27, 2007 medical admission.  Blood pressure has come under control on a  number of medications which will be outlined below.   PAST SURGICAL HISTORY:  The only other prior surgery was a hernia  repair.   CURRENT MEDICATIONS:  1. Decadron 20 mg b.i.d. weekly on Mondays, to resume next week on      July 01, 2007.  2. Alkeran 9 mg per meter squared, total dose of 18 mg daily x4  days.      Second course to begin on July 01, 2007.  3. Lenalidomide (Revlimid) 15 mg daily on days of dialysis, 3 weeks      out of 4 to begin today, June 25, 2007.  4. He will also continue monthly Zometa or comparable bisphosphonate      to maintain calcium in the normal range.  Next dose also due on      July 01, 2007.  5. Lovenox 100 mg subcu daily.  6. Aranesp 200 mcg weekly to maintain hemoglobin.  7. Lexapro 10 mg daily.  8. Duragesic 25 mcg patch every 3 days for pain.  9. Hydralazine 25 mg t.i.d.  10.Lopressor 50 mg b.i.d.  11.Iron dextran 50 mg IV weekly at time of dialysis.  12.Hectorol 1 mcg IV x1 given on June 18, 2007.  13.Lanthanum carbonate 1000 mg t.i.d. weekly.  14.MiraLax 17 grams daily.  15.Senokot one daily.  16.Tylenol p.r.n. headache, pain, or fever.  17.Zofran 48 mg q.8h. p.r.n. nausea or vomiting.   ALLERGIES:  NO KNOWN DRUG ALLERGIES.   FAMILY HISTORY:  Noncontributory.   SOCIAL HISTORY:  He is single, never married, no children.  Lives with  his mother.  He is unemployed.  He used to do landscaping work.  No  alcohol or tobacco.   PHYSICAL EXAMINATION:  GENERAL:  Adequately nourished African American  man who is uncomfortable.  Right antecubital PICC catheter; Right subclavian dialysis access  catheter; fresh vascular graft left forearm;  VITAL SIGNS:  Blood pressure 125/75, pulse 108 and regular, respirations  20, temperature 98.4.  SKIN, HAIR, AND NAILS:  Normal.  HEENT:  Pupils equally reactive to light.  Pharynx with no erythema or  exudate.  NECK:  Supple.  LUNGS:  Clear and resonant to percussion.  CARDIAC:  Regular cardiac rhythm.  No murmur.  No lymphadenopathy.  ABDOMEN:  Soft, moderately distended, nontender.  No mass, no  organomegaly.  EXTREMITIES:  Currently no edema.  No calf tenderness.   NEUROLOGIC:  Distal motor strength at the ankles 5/5.  Exam of left  lower extremity limited by pain.  He has a number of  superficial sacral decubitus ulcers.   LABORATORY DATA:  Most recent laboratory from June 23, 2007 with  sodium 125, potassium 4.1, BUN 72, creatinine 4.8, random glucose 141.  Most recent CBC from June 22, 2007 with hemoglobin 9.8, hematocrit  29, white count 6400, platelets 135,000.  Most recent low-molecular-  weight heparin level on 100 mg subcutaneous daily  0.7 which is mid-range  therapeutic (peak level 0.5-1.2).   IMPRESSION:  1. Stage IV multiple myeloma.  2. Extensive bone disease secondary to number 1.  3. 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  4. Acute renal failure secondary to number 1 and 3 now dialysis      dependent  5. 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  6. Extensive left lower extremity deep venous thrombosis.  7. Previously untreated hypertension, currently controlled on      medication.  8. Hyponatremia likely secondary to interaction with elevated serum      immunoglobulins.  9. Sacral decubitus ulcers.  10.Reactive depression.   RECOMMENDATIONS:  I will continue his antimyeloma treatment as outlined  above while he is continuing the physical rehabilitation program.  He  will also continue on dialysis.   He has a PICC catheter in the right antecubital position which can be  removed at this time since we are not using it.   Thank you for this consultation.  I will follow the patient with you.      Genene Churn. Cyndie Chime, M.D.  Electronically Signed     JMG/MEDQ  D:  06/25/2007  T:  06/25/2007  Job:  [id number]   cc:   Ranelle Oyster, M.D.  Dyke Maes, M.D.  Durene Romans. Lestine Box, MD  Vanita Panda. Magnus Ivan, M.D.

## 2010-10-11 NOTE — H&P (Signed)
NAMESHONDELL, POULSON NO.:  [id number]   MEDICAL RECORD NO.:  [id number]          PATIENT TYPE:  IPS   LOCATION:  4140                         FACILITY:  [facility]   PHYSICIAN:  Ellwood Dense, M.D.   DATE OF BIRTH:  March 12, 1961   DATE OF ADMISSION:  06/24/2007  DATE OF DISCHARGE:                              HISTORY & PHYSICAL   This is a history and physical admit note to the rehabilitation unit.   PRIMARY CARE PHYSICIAN:  None.   ONCOLOGIST:  Genene Churn. Cyndie Chime, M.D.   PULMONOLOGIST:  Charlaine Dalton. Sherene Sires, MD, FCCP.   CARDIOLOGIST:  Arturo Morton. Riley Kill, MD, [facility].   TRAUMA/ORTHOPEDIST:  Vanita Panda. Magnus Ivan, M.D.   HISTORY OF PRESENT ILLNESS:  Mr. Abercrombie is a 50 year old African-  American male with a history of untreated hypertension.  He was admitted  on May 26, 2007 with a cough along with shortness of breath and  complaints of left hip pain for approximately one year's duration.  Hemoglobin on admission was 5.6 with a sodium of 128 and creatinine of  2.51.  X-rays revealed huge expansive and destructive process involving  the left lower pelvis as well as the right pubic bones, lytic lesions at  C7, T3 and T8 along with T10 vertebral bodies and left humeral head.  Serum protein electrophoresis showed monoclonal gammopathy, and a bone  biopsy was positive for multiple myeloma.  Patient was evaluated by Dr.  Cyndie Chime from oncology and started on high dose steroids along with  Alkeran.  Dr. Magnus Ivan was consulted for input and felt no surgical  intervention was necessary.  The patient did start radiation treatments  for pain management and was made nonweightbearing in the left lower  extremity.  He had a total treatment of 10 radiation treatments to the  left hip and thoracic spine.   The patient's hospital course was significant for renal failure  requiring the initiation of hemodialysis.  He did develop left lower  extremity deep venous thrombosis  and was started on Lovenox on June 10, 2007.  He had a permanent catheter placed by interventional  radiology for hemodialysis.  He did develop bleeding from the cath site  requiring suturing.  An AV graft was placed on June 21, 2007 by Dr.  Edilia Bo.  The patient has had depressed mood and was started on Lexapro.  Therapies have been ongoing with the patient, showing improved  motivation.  He does require cues for safe walker use and has ambulated  35 feet with minimal assist with rest breaks x2.  He has left hip pain  with pop noted on June 22, 2007.  X-rays showed lucency of the left  acetabular ring/left obturator ring, making evaluation for fracture  near impossible.  Renal ultrasound showed echogenic, enlarged kidneys  with small amount of ascites.   The patient was evaluated by the rehabilitation physicians and felt to  be an appropriate candidate for inpatient rehabilitation.   REVIEW OF SYSTEMS:  Positive for wound healing.   PAST MEDICAL HISTORY:  Untreated hypertension.   FAMILY HISTORY:  Positive for diabetes mellitus, cancer, and  coronary  artery disease.   SOCIAL HISTORY:  The patient lives with his mother in a one level home  with 2-3 steps to enter.  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  He does not use alcohol or tobacco.   FUNCTIONAL HISTORY:  Prior to admission, independent and driving prior  to admission.   ALLERGIES:  No known drug allergies.   MEDICATIONS PRIOR TO ADMISSION:  None.   LABORATORY:  Recent hemoglobin was 9.8 with a hematocrit of 29.1,  platelet count of 135,000, white count 6.4.  Patient's sodium was 125,  potassium 4.1, chloride 96, bicarbonate 25, BUN 72, creatinine 4.8,  elevated glucose of 141.   MRI scan of the cervical and thoracic spine showed changes of myeloma  through the cervical vertebrae, most abnormal with T6-7 body,  spondylosis of C6-7 with myeloma changes through the thoracic  spine,  most notable at T3, followed by T8 and T10.   PHYSICAL EXAMINATION:  A well-appearing, middle-aged adult male lying in  bed in mild acute discomfort.  Blood pressure 180/98 with a pulse of 96, respiratory rate 20, and  temperature 98.2.  CBGs have been 196, 152, and 142.  HEENT:  Normocephalic and atraumatic.  CARDIOVASCULAR:  Regular rate and rhythm.  S1 and S2 without murmurs.  ABDOMEN:  Soft and nontender with positive bowel sounds.  LUNGS:  Clear to auscultation with decreased breath sounds at the bases.  NEUROLOGIC:  Alert and oriented x3 with slight slowness of answers.  Cranial nerves exam was unremarkable with tongue in midline and normal  facial asymmetry.  Bilateral upper extremity exam showed normal bulk and  tone throughout.  Strength was -5/5 throughout the bilateral upper  extremities.  Lower extremity exam on the right showed hip flexion, knee  extension, and ankle dorsiflexion at 4/5.  Left lower extremity is  difficult to assess secondary to pain, but he appears to have at least  an intact knee extension and ankle dorsiflexion of approximately 2/5  with complaints of pain.   DIAGNOSIS:  1. Stage IV multiple myeloma with extensive bony disease involving the      cervical and thoracic spine along with pelvis and left humeral      head.  2. New onset, renal failure, secondary to #1.  3. Steroid-induced diabetes mellitus.  4. Extensive left lower extremity deep venous thrombosis.  5. Situational depression.  6. Hypertension.   Presently, the patient needs assistance with ADLs, transfers, ambulation  related to the above-noted multiple myeloma with extensive involvement  of the pelvis, especially on the left, along with cervical and thoracic  spine and left humeral head.   PLAN:  1. Admit to the rehabilitation unit for daily therapies, to include      physical therapy for range of motion, strengthening, bed mobility,      transfers, pre-gait training, gait  training and equipment      evaluation.  2. Start the patient on therapy for range of motion, strengthening,      ADLs, cognitive/perceptual training, splinting and equipment      evaluation.  3. Rehab nursing for skin care, wound care, and bowel and bladder      training as necessary.  4. Case management to assess home environment, assist with discharge      planning, and arrange for appropriate follow-up care.  5. Social work to assess family and social support.  6. Consultation regarding disability issues and assist in discharge  planning.  7. Continue 80-2-2 1200 cc renal diet.  8. Nonweightbearing on the left lower extremity (strict).  9. Neuropsych consult for coping with Dr. Leonides Cave.  10.OxyContin 5 mg 1-2 tablets p.o. q.4h. p.r.n. pain.  11.Colace 200 mg p.o. nightly.  12.Hydralazine 25 mg p.o. t.i.d. on Monday, Wednesday, and Friday,      along with Sundays, holding for systolic blood pressure of less      than 100.  13.Hemodialysis on Tuesday, Thursday, and Saturday.  14.Lopressor 50 mg p.o. b.i.d. holding for systolic blood pressure      less than 120.  15.Protonix 40 mg p.o. b.i.d.  16.MiraLax 17 gm in 8 ounces of water daily.  17.Nephro-Vite 1 p.o. daily.  18.Aranesp 200 mcg IV weekly with hemodialysis.  19.Hectorol 1 mg IV on Tuesday, Thursday, and Saturday with      hemodialysis.  20.Fosrenol 1000 mg p.o. t.i.d. q.a.c.  21.Melphalan 10 mg p.o. on Tuesday.  22.Decadron 20 mg p.o. b.i.d. on Mondays, holding the dose today.  23.Diflucan 100 mg p.o. daily x6 days.  24.Lexapro 10 mg p.o. daily.  25.Lovenox 100 mg subcu daily.  26.Ambien 10 mg p.o. nightly p.r.n.  27.Duragesic patch 50 mcg/hr, change q.72h.  28.INFeD 50 mg IV on Tuesday with hemodialysis.  29.Xanax 0.25 mg p.o. q.8h. p.r.n.  30.Xopenex 0.62 mg nebulizers q.6h. p.r.n., dispense as written.  31.Dilaudid 1-2 mg p.o. q.4h. p.r.n. severe pain.  32.Wound care consult for sacral decubitus.  33.Glycerin  suppository 1 p.r. daily p.r.n.  34.Alterna gel 10 cc p.o. q.4h. p.r.n.  35.Simethicone 40-80 mg p.o. q.4h. p.r.n.  36.Eucerin cream to the bilateral feet in the a.m. and p.m.  37.Sliding scale NovoLog insulin t.i.d. q.a.c. and nightly.   Allow patient up to bedside commode with strict nonweightbearing of the  left lower extremity, creating pressure relief measures.   Vinegar soaks to bilateral feet q.p.m. x3 nights.   Please get copies of the last five days of hemodialysis notes, orders,  labs, to chart.   PROGNOSIS:  Good,   ESTIMATED LENGTH OF STAY:  7-12 days.   GOALS:  Modified independent, ADLs, and transfers with standby assist  ambulation, stairs, and car transfers.           ______________________________  Ellwood Dense, M.D.     DC/MEDQ  D:  06/24/2007  T:  06/24/2007  Job:  [id number]

## 2010-10-11 NOTE — Discharge Summary (Signed)
NAMEGRAHAM, [name]             ACCOUNT NO.:  [id number]   MEDICAL RECORD NO.:  [id number]          PATIENT TYPE:  INP   LOCATION:  6706                         FACILITY:  [name]   PHYSICIAN:  [name] RobinsonDATE OF BIRTH:  Feb 26, 1961   DATE OF ADMISSION:  06/06/2007  DATE OF DISCHARGE:  06/24/2007                               DISCHARGE SUMMARY   HISTORY OF PRESENT ILLNESS:  [name] is a 50 year old man who has  not seen a physician in many years.  [name] on the day of the current admission with  an approximate 1-week history of progressive fatigue, dyspnea, left hip  pain and left lower extremity swelling.  He also complained of a  productive cough with associated fevers and chills.  Initial laboratory  data done in the emergency department showed severe anemia with a  hemoglobin of 5.6, renal insufficiency with a creatinine of 2.5,  hyponatremia with a sodium of 128, marked elevation of serum total  protein at 10.5 g and hypercalcemia with initial calcium 10.5. albumin  of 2.4 making corrected calcium approximately 12.  Chest radiograph  showed a left lingular infiltrate consistent with pneumonia.  He was  admitted to the hospital for further evaluation.   He was initially managed by the [name].  Regular x-  rays of his hips were done and showed a large expansile lytic lesion  replacing the entire left innominate bone with disease extending into  the superior pubic ramus on the right.  The spectrum of findings was  highly suggestive of multiple myeloma.  I was asked to evaluate the  patient and subsequently assumed his care.   Additional problems identified on admission included uncontrolled  hypertension with initial blood pressure recorded at 200/133 with pulse  118, temperature 100.1, respirations 28.  Findings on initial exam  showed he was he was tachycardiac.  There were scattered  wheezes over  the lungs.  The abdomen was not distended, no mass or organomegaly.  There was 1-2+ edema of the ankles up to the knee, left greater than  right.  Calves were nontender.  Neurologic exam was grossly normal.  Limited exam of the left lower extremity due to pain.  He was unable to  flex his leg against gravity or resistance.  Reflex was intact at the  knee and symmetric compared with the right.   HOSPITAL COURSE:  He was started on antihypertensive medication to  control his blood pressure with eventual success.  He was started on  vigorous saline hydration with forced furosemide diuresis and calcium  lowering agents including pamidronate and calcitonin .  Additional  studies were done to evaluate his bones including a metastatic bone  survey which, in addition to the known lesions seen on initial x-rays,  showed a likely pathologic fracture at T3.  There was a vague lesion at  T8 and degenerative changes at L5-S1.  A nuclear medicine bone scan  showed only low-level uptake in the area of known extensive destruction  in the left hip which is typical for multiple  myeloma.  The only other  area of abnormality was focal uptake within the manubrium sternum.  MRI  of the spine was done for better assessment of the spine lesions to rule  out early cord compromise.  Although degraded by motion artifact, there  were myeloma changes throughout his cervical, thoracic and lumbar spine.  Most prominent lesions at C7, but without any cord compromise at that  level.  Additional lesions seen at theT3 level where disease extended  throughout the vertebral body into the pedicle and portion of the facet  joint with some posterior bulging with mild spinal stenosis and slight  spinal cord deformity.  Additional lesions at T8 and T10 with no cord  compromise.  The patient could not comply with a complete study, so the  lumbar and sacral spine were not imaged.   Consultation requested from  interventional radiology with respect to  getting diagnostic tissue.  A needle-guided biopsy of the left pelvic  lesion was done on May 29, 2007.  I personally reviewed these  slides.  Findings were extensive sheets of plasma cells consistent with  multiple myeloma.  Ancillary laboratory studies also confirmed that he  had IgG kappa, multiple myeloma with a 10 g IgG protein in the serum and  10 g of myeloma protein in a 24-hour urine collection.  There was  concomitant suppression of serum IgA and IgM also very consistent with  multiple myeloma.  A serum beta-2 microglobulin level was markedly  elevated at 12.4 (1.01-1.73).  Serum LDH was elevated at 317.  As stated  initial albumin was suppressed.  In short, he had every negative  prognostic feature at diagnosis.   Treatment for the myeloma was initiated initially with pulse high-dose  dexamethasone while the other studies were in progress.  He began  Decadron 20 mg b.i.d. x4 days starting on May 31, 2007.  Starting on  June 04, 2007, he began Alkeran 9 mg/sq m, total dose 18 mg daily x4  days based on height of 6 feet 2 inches, weight 273 pounds, body surface  area 2.5 sq m.  Decadron was continued at a dose of 40 mg weekly  beginning on June 07, 2007.  Plan is to add Lenalidomide 15 mg daily,  on days of dialysis 3 weeks out of 4 when the drug is available.  Application in process through the pharmaceutical company.   He tolerated the steroids and Alkeran well with no acute toxicity.  Calcium came under rapid control.  Despite what appeared to be an  aggressive good start in his treatment, he suddenly developed  progressive renal failure almost overnight.  Initial creatinine was 2.5  and came down to 2.1 by June 03, 2007.  Good urine output was  maintained.  On January 7, there was a sudden rise in his BUN and  creatinine to 96 and 3.2 respectively.  There was concomitant rise in  his potassium to 5.2 and fall in his  serum bicarbonate level to 19.  There was progressive hyponatremia down to 123.  Urgent renal ultrasound  was obtained which did not show any evidence for obstruction.  Kidneys  appeared large an echogenic consistent with medical renal disease.  Nephrology consultation was obtained to assist with management.  Renal  function continued to deteriorate with rise in creatinine to a peak  value of 5.  Trial of high-dose furosemide unsuccessful in improving  renal function.  Dialysis was indicated at that point.  The patient was  transferred to [facility] to receive dialysis.  After  approximately 2 weeks of dialysis, there was no stabilization or  improvement in his renal function.  It appeared that he would need  ongoing dialysis support.  Vascular surgery consultation was obtained.  A vascular access catheter was  placed on June 21, 2007, for  continued dialysis support.   An initial orthopedic surgery consultation was obtained to see if any  surgical stabilization procedure was indicated with respect to the  extensive destructive lesion of his left hip.  Unfortunately, disease  was too extensive for any reasonable surgical correction.  Radiation  oncology consultation was obtained.  He began and then completed a  course of palliative radiation to the left hip and to the area of the T3  spine by Dr. Jodi Geralds.  Initial cardiology and pulmonary  consultations were obtained to assist with management of hypertension  and to further evaluate the left lung infiltrate.  On initial chest  radiograph, radiologists put malignancy in his differential for the left  lung infiltrate.  However, a CT scan clearly showed that this was an  acute infection and not tumor.  He did have elevated serum troponins.  No elevation of CK enzymes.  His electrocardiogram was normal.  It was  not felt that he had any signs of acute cardiac ischemia.   With respect to his initial left lower extremity  edema, a venous Doppler  study was done on May 26, 2007.  At that time, no evidence for deep  venous thrombosis was seen.  He was put on prophylactic doses of low-  molecular-weight heparin.  Due to persistent swelling of the left lower  extremity despite resolution of right lower extremity edema on  diuretics, I repeated the Doppler study on June 10, 2007.  At that  point, it did show extensive left lower extremity deep venous thrombosis  from the posterior tibial vein through the popliteal and femoral veins  and just into the distal portion of the common femoral vein.  Low-  molecular-weight heparin dose was increased to a therapeutic dose.   His pneumonia was treated with antibiotics x1 week.  Pulmonary symptoms  resolved.   He developed profuse bleeding at the site of the renal dialysis catheter  in the right subclavian position.  Initial coagulation studies on  admission showed a pro time of 17 seconds.  I administered oral vitamin  K.  His protime came down to 16.2 seconds with upper limits of normal  15.2, PTT was normal at 36 seconds (24-37).  His platelet count was  normal at 230,000.   He was on aspirin 81 mg daily which was stopped.  The low-molecular-  weight heparin was temporarily discontinued and Lovenox factor X levels  were obtained.  The first level done on January 18, off the Lovenox x24  hours, was subtherapeutic at 0.3 units.  Interventional radiology was  asked to re-evaluate the patient at that point.  An additional suture  was placed with good hemostasis.  Lovenox was resumed at a therapeutic  dose and titrated to a therapeutic level with most recent level done on  June 18, 2007, of 0.7 (therapeutic being 0.5-1.2).  No further  bleeding from the catheter.   He did develop reactive depression.  He was started on a low dose of  Lexapro 10 mg daily.   He developed a small sacral decubitus ulcer which was treated with local  care.  Prior to this  hospitalization,  the patient lived with his elderly  mother.  Due to his severe current handicaps, it was not feasible for  him to go back home.  Physical therapy, occupational therapy and  rehabilitation medicine consultations were obtained.  He was felt to be  an acceptable candidate for short-term inpatient rehabilitation for gait  training and transfers.  Orthopedic surgeons recommended that he be  nonweightbearing on his left leg in view of the extensive destruction of  the left hip bone.  Once the patient has the vascular access shunt  placed and is otherwise stable, he will be discharged from acute care  medicine and readmitted to the inpatient rehabilitation unit with a  tentative plan for transfer on Monday, June 24, 2007.   CONSULTATIONS:  1. Cardiology.  2. Pulmonary critical care medicine.  3. Interventional radiology.  4. Medical oncology.  5. Radiation oncology.  6. Nephrology.  7. Rehabilitation medicine.   PROCEDURES:  1. Placement of a right peripherally inserted central catheter.  2. CT-guided biopsy of a left hip lesion.  3. Placement of right subclavian vascular access catheter.  4. Oral chemotherapy for myeloma.  5. Radiation therapy to lytic myeloma lesions of the left hip and      thoracic spine.  6. Hemodialysis.  7. Planned placement of an arteriovenous fistula for ongoing dialysis      on June 21, 2007.  8. Venous Doppler studies of the left lower extremity x2.   DISCHARGE DIAGNOSES:  1. Stage IV IgG Kappa multiple myeloma.  2. Extensive lytic bone lesions secondary to #1.  3. 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  4. Acute renal failure secondary to #1 and #2.  5. Hyponatremia likely secondary to elevated serum total protein.  6. Hyperkalemia secondary to acute renal failure.  7. Left lower lobe pneumonia.  8. Uncontrolled essential hypertension.  9. Extensive left lower extremity deep venous thrombosis.  10.Severe anemia secondary  to multiple myeloma.  11.Reactive depression.  12.Sacral decubitus ulcer.  13.Local hemorrhage at site of the hemodialysis catheter.  14.Steroid-induced hyperglycemia.   CONDITION ON DISCHARGE:  The condition is medically stable for transfer  to rehabilitation medicine service.  He will continue hemodialysis three  times weekly.   DISCHARGE MEDICATIONS:  1. He will continue antimyeloma therapy to include dexamethasone 40 mg      p.o. weekly.  2. Alkeran 9 mg/sq m daily x4 days each month, next dose due first      week in February.  3. Revlimid 15 mg, 21 days each month, to begin after readmission to      the rehab unit week of June 24, 2007.  4. Aranesp 200 mcg IV weekly.  5. Hectorol 1 mcg IV x1 given January 20.  6. Lexapro 10 mg daily.  7. Duragesic 25 mcg patch every 3 days for pain.  8. Hydralazine 25 mg t.i.d.  9. Iron dextran 50 mg IV weekly at time of dialysis.  10.Lanthanum carbonate 1000 mg t.i.d. weekly.  11.Lopressor 50 mg b.i.d.  12.Protonix 40 mg b.i.d.  13.Vitamin K 10 mg p.o. weekly.  14.MiraLax 17 g p.o. daily.  15.Senokot one daily.  16.Tylenol 650 mg q.4 h. p.r.n. pain, headache or fever.  17.Zofran 4-8 mg p.o. q.8 h. p.r.n. nausea or vomiting.  18.Ambien 10 mg nightly p.r.n. sleep.  19.lovenox 100 mg SQ daily   ALLERGIES:  No known drug allergies.      [name] Churn. Cyndie Chime, M.D.  Electronically Signed     JMG/MEDQ  D:  06/20/2007  T:  06/20/2007  Job:  [id number]   cc:   Lonia Blood, M.D.  Dyke Maes, M.D.  Durene Romans. Lestine Box, MD  Ranelle Oyster, M.D.  Arturo Morton. Riley Kill, MD, [facility]  Charlaine Dalton. Sherene Sires, MD, FCCP  Vanita Panda. Magnus Ivan, M.D.

## 2010-10-11 NOTE — Discharge Summary (Signed)
[name], [name]             ACCOUNT NO.:  [id number]   MEDICAL RECORD NO.:  [id number]          PATIENT TYPE:  IPS   LOCATION:  4140                         FACILITY:  [facility]   PHYSICIAN:  Ranelle Oyster, M.D.DATE OF BIRTH:  1961/03/13   DATE OF ADMISSION:  06/24/2007  DATE OF DISCHARGE:  07/22/2007                               DISCHARGE SUMMARY   DISCHARGE DIAGNOSIS:  1. Stage IV multiple myeloma with extensive bone disease involving      left pelvis greater than right, as well as thoracic spine including      T3, T8 and T10 vertebral body and left humeral head.  2. End-stage renal disease.  3. Anemia of chronic disease.  4. Extensive left lower extremity deep venous thrombosis.  5. Hypertension.  6. Steroid-induced diabetes mellitus.  7. Enterococcus sepsis.  8. Hypercalcemia.   HISTORY OF PRESENT ILLNESS:  [name] is a 50 year old male with  history of uncontrolled hypertension admitted May 26, 2007 with  cough, increased shortness of breath  and complains of left hip pain of  1 year duration.  The patient was noted to be anemic with hemoglobin of  5.6, hypernatremic with sodium of 128 and noted to have creatinine  elevated at 2.51.  X-rays done revealed huge extensive destructive  process involving left lower pelvis, as well as right pubic bones, lytic  lesions T7, T3, T8, T10 vertebral body and left humeral head.  Serum  protein electrophoresis showed monoclonal gammopathy and bone biopsy  with multiple myeloma.  He was evaluated by Dr. Cyndie Chime and started  on high dose steroids as well as Alkeran.  Dr. Magnus Ivan is consulted for  input and felt the patient did not require any surgical intervention,  XRT for pain management and recommended non weightbearing status, left  lower extremity.  The patient has been treated with 10 treatment doses  of radiation therapy to the left hip at T5.  The patient was noted to  have progressive renal failure requiring  initiation of hemodialysis.  He  did have Perm-Cath placed for hemodialysis and developed bleeding from  cath site requiring siderin.  AV graft was placed on June 21, 2007.  The patient developed left lower extremity DVT and was started on  Lovenox on June 10, 2007.  He is noted to have issues with depressed  mood and started on Lexapro for mood stabilization.  Therapies were  initiated and the patient is noted to have improving motivation.   Seen for safe walker use with requiring minimal assistance for  ambulating short distances.  Currently continues with complaints of left  hip pain.   PAST MEDICAL HISTORY:  Significant for hypertension that has been  untreated.   ALLERGIES:  NO KNOWN DRUG ALLERGIES.   FAMILY HISTORY:  Positive for diabetes, cancer, and coronary artery  disease.   SOCIAL HISTORY:  The patient lives with mother in 1 level home with 2-3  steps at entry.  Used to work doing odd jobs like Materials engineer.  Does  not use any tobacco or alcohol.   HOSPITAL COURSE:  [name] was admitted  to rehab on June 24, 2007 for inpatient therapy to consist of PT and OT daily.  Past  admission the patient was noted to have temp spike of 101 on June 26, 2007.  Blood cultures x2 were drawn and the patient was started  prophylactically on IV Fortaz as well as IV vancomycin.  The patient's  blood cultures were positive for Enterococcus and Elita Quick was  discontinued on June 28, 2007 and vancomycin was continued.  Currently the patient continues vancomycin Tuesday, Thursday, Saturdays  with hemodialysis.  During his stay in rehab, initially the patient was  noted to have issues with bleeding, especially nose bleeds requiring ENT  evaluation as well as placemen of packing in his left naris.  He  developed a hematoma in his left forearm AV graft site additionally.  Dr. Cyndie Chime now has been following the patient.  Started on  decreasing Lovenox dosage to  prevent further bleeds.  Currently the  patient continued on 40 mg subcu Lovenox daily without any further  bleeding complications.  Pain control has been an issue.  The patient  was started on Duragesic patch.  This was slowly increased to 15 mcg a  night, as well as lidocaine patch to right lower back to help with pain  management issues.  The patient was noted to have issues of anorexia and  was started on Megace to help him improve appetite.   Dr. Cyndie Chime has been following the patient for treatment of multiple  myeloma and input.  Lenalidomide 50 mg p.o.,  hemodialysis on Tuesday,  Thursday, Saturday x3 weeks was ordered.  Dr. Cyndie Chime felt Megace  would thrombogenic and discontinued Megace.  The patient was treated  with Alkeran 8 mg p.o. t.i.d., T3, T4, and T5 with Compazine 30 minutes  prior to Alkeran dose.  Decadron was increased to 20 mg p.o. b.i.d. q.  Tuesday.  The patient was also treated with Rezamid x9 total dose.  The  patient adjuvantly got 24 Alkeran doses during this cycle.  Chemo was  now placed on hold and Neupogen was initiated.  The patient was noted to  have hypercalcemia on July 12, 2007 and was treated with dose of  Zometa.   The patient's hospital course was completed with recurrent fevers on  July 13, 2007 with T-max of 101.9.  Chest x-ray to question right  lower lobe pneumonia and Rocephin was added additionally for treatment.  So the patient was noted to have some yeast in his urine, was started on  Diflucan additionally.  Repeat blood cultures were done.  The patient  defervesced and blood cultures negative.  Rocephin was discontinued on  July 19, 2007.  More recent labs of July 18, 2007 shows serum  IgG at 5260.  Ionized calcium is 0.81.  WBCs reveal hemoglobin 8.1,  hematocrit 23.8.  White count 4.6.  Platelets 225.  Check of lytes  revealed sodium 131, potassium 3.7, chloride 98, CO2 21, BUN 82,  creatinine 9.52, glucose 85,  albumin 2.0, calcium 6.3, and phosphorus  6.5.  The patient's [name] is placed on hold.  Last PTH is high at  538.4 with calcium for PTH at 70.2.  Most recently the patient has had  issues with nausea being treated with Zofran intermittently.  KUB was  ordered on July 19, 2007 and is currently pending.   Due to left forearm AV graft hematoma, the patient required ligation of  left forearm AV graft with placement of new left upper  arm AV graft.  Clinically left forearm incision is clean and dry, and with resolution  of hematoma.  Left upper arm AV graft site is healing well without any  bleeding or hematoma formation.  The patient will continue to require to  be on 1 additional week of vancomycin which will be through July 27, 2007.  Hem-Onc recommends monitoring of count weekly secondary to his  Alkeran chemo.  As long as WBC is stable, the patient would not require  Neupogen.  The patient's overall mobility has greatly improved.  Pain  control is well managed.  Therapies have been ongoing during this stay  and the patient is currently being assessed for low body K.  Continues  to have issues with incontinence of bowel.  The patient is able to  gather clothing at regional level with supervision.  Is able to perform  bathing and dressing at sink level, set up and minimal work with chain  for safe sequencing.  He is able to maintain non weightbearing on the  left during bathing activities.  Requires total assist to don shoes.  He  is inconsistent with the amount of assistance needed.  With fatigue he  continues to require more of minimal assist.   PT notes indicate the patient is at supervision for rolling walker and  transfers.  He is able to stand using upper extremities.  He is able to  ambulate non weightbearing up to 60 feet x2 with minimal assist.  Able  to perform bilateral lower extremity strengthening exercises 30 times.  Currently is taking greater amount of assist as  needed.  The family has  elected on SNF.  Bed is available at [facility] in [facility],  and the patient to be discharged to this facility as no other facility,  [facility] facility currently has bed.  On July 22, 2007 the patient  to be discharged to SNF.   DISCHARGE MEDICATIONS:  1. IV vancomycin, continue to July 27, 2007.  2. Protonix 40 mg b.i.d.  3. Aranesp 200 mcg IV with hemodialysis on Thursday.  4. Nephrovite once a day.  5. Adderall 2 mcg IV Tuesday, Thursday, Saturday with hemodialysis.  6. Duragesic patch 50 mcg, change q.72 h.  7. Lexapro 10 mg daily.  8. Decadron 20 mg p.o. b.i.d. weekly on Tuesdays.  9. Toprol XL 25 mg p.o. nightly.  10.Lovenox 40 mg subcu daily.  11.Saline nasal spray 1 squirt each nostril hourly while awake.  12.Lidocaine patch to right lower back, only came off 8 p.m. daily.  13.Diflucan 100 mg per day.  14.Zofran 8 mg p.o. q.8 a.m. and q. 6 h. p.r.n.  15.Nephro vanilla supplements t.i.d.  16.Eucerin cream to bilateral lower extremity b.i.d.  17.AlternaGEL suspension 10 mL p.o. q.4 h. p.r.n.  18.Mylicon 40-80 mg p.o. q.4 h. p.r.n.  19.Xanax 0.2, 5 mg p.o. q.8 h. p.r.n.  20.OxyIR 5-10 mg p.o. q.4 h. p.r.n. pain.  21.Flexeril 5 mg p.o. t.i.d. p.r.n.  22.Dilaudid 1-2 mg p.o. q.4 h. p.r.n. severe pain.   DIET:  AD 2 , 12 hour T tube fluid restriction and __________.   SPECIAL INSTRUCTIONS:  Hemodialysis to continue Tuesday, Thursday,  Saturday.  Continue monitoring blood sugars at a.c. and nightly basis  with sliding scale insulin per protocol.  Routine pressure relief  measures.  Progressive PT, OT to continue past discharge.  Weekly checks  of CBC to monitor for any drop in white count.  Continue non  weightbearing  status left lower extremity.   FOLLOWUP:  The patient to follow up with Dr. Doneen Poisson for  any further input regarding left hip.  Follow up with Dr. Cyndie Chime,  or Hem[facility]Onc in [facility] for management  of his multiple myeloma.  Follow  up with Dr. Pollyann Kennedy for recurrent nose bleeds.  Follow up with Dr. Edilia Bo  for issues regarding AV graft.  Follow up with renal service for  hemodialysis.  Follow up with Dr. Riley Kill as needed.      Greg Cutter, P.A.      Ranelle Oyster, M.D.  Electronically Signed    PP/MEDQ  D:  07/19/2007  T:  07/20/2007  Job:  [id number]   cc:   Genene Churn. Cyndie Chime, M.D.  Dr. Magnus Ivan

## 2010-10-11 NOTE — Op Note (Signed)
NAMEISIDOR, BROMELL             ACCOUNT NO.:  [id number]   MEDICAL RECORD NO.:  [id number]          PATIENT TYPE:  OUT   LOCATION:  XRAY                         FACILITY:  [facility]   PHYSICIAN:  Di Kindle. Edilia Bo, M.D.DATE OF BIRTH:  1960-09-04   DATE OF PROCEDURE:  07/10/2007  DATE OF DISCHARGE:                               OPERATIVE REPORT   PREOPERATIVE DIAGNOSIS:  Chronic renal failure.   POSTOPERATIVE DIAGNOSIS:  Chronic renal failure.   PROCEDURE:  1. Ligation of a left forearm arteriovenous fistula.  2. Placement of a new left upper arm arteriovenous fistula.   SURGEON:  Di Kindle. Edilia Bo, M.D.   ASSISTANT:  Nurse.   ANESTHESIA:  Local with sedation.   TECHNIQUE:  The patient was taken to the operating room and sedated by  anesthesia.  The left upper extremity was prepped and draped in the  usual sterile fashion.  After the skin was infiltrated with 1%  lidocaine, a transverse incision was made just above the antecubital  level where the brachial artery was dissected free.  The artery had a  good pulse.  The cephalic vein was dissected free and mobilized distally  where it was ligated.  It irrigated up nicely with heparinized saline.  The patient was then heparinized.  The brachial artery was clipped  proximally and distally and a longitudinal arteriotomy was made.  The  vein was mobilized over and sewn end-to-side to the artery using  continuous 6-0 Prolene suture.  At completion, there was an excellent  thrill in the fistula.  Hemostasis was obtained in this wound.  This  wound was then closed with a deep layer of 3-0 Vicryl and the skin  closed with 4-0 Vicryl.  Next, the previous incision at the wrist was  anesthetized. She had developed a hematoma here and I also wanted to  ligate the fistula which was not maturing. The previous incision was  opened. The old hematoma was evacuated.  The vein was ligated and  divided.  Hemostasis was obtained and the  wound closed with interrupted  3-0 nylons.  A sterile dressing was applied.  The patient tolerated the  procedure well and was transferred to the recovery room in satisfactory  condition.  All needle and sponge counts were correct.      Di Kindle. Edilia Bo, M.D.  Electronically Signed     CSD/MEDQ  D:  07/10/2007  T:  07/11/2007  Job:  [id number]

## 2010-10-11 NOTE — Consult Note (Signed)
NAMEANSAR, SKODA             ACCOUNT NO.:  [id number]   MEDICAL RECORD NO.:  [id number]          PATIENT TYPE:  INP   LOCATION:  1321                         FACILITY:  [facility]   PHYSICIAN:  Dyke Maes, M.D.DATE OF BIRTH:  [birth year]   DATE OF CONSULTATION:  DATE OF DISCHARGE:                                 CONSULTATION   REFERRING PHYSICIAN:  Genene Churn. Cyndie Chime, M.D.   REASON CONSULTATION:  Acute renal insufficiency.   HISTORY OF PRESENT ILLNESS:  This is a 50 year old black male who was  admitted on May 25, 2008 with shortness of breath, left hip pain  and hemoglobin 5.6.  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  Further workup revealed him to have IgG kappa myeloma.  He has had high blood pressure.  His only medical history in the past  has been high blood pressure that he has known about since at least  1999, though he is sought no medical attention for this and thus has not  seen a doctor up until now.  His blood pressure was 200/133 on  admission.  Serum creatinine on admission was 2.5 and subsequently  decreased to 1.9 on 06/04/2007.  Today it suddenly increased to 3.3.  Renal ultrasound today showed no hydronephrosis.  His urine output has  been excellent of 1.3 to 3.8 liters per day.  He has been in positive  fluid balance approximately 3 liters over the last 3-4 days.  His  urinalysis except for protein was benign on admission, though he now has  gross hematuria after Foley placement.   PAST MEDICAL HISTORY:  Significant for only history of hypertension  which is untreated.   ALLERGIES:  None.   MEDICATIONS:  Include:  1. Aspirin 81 mg a day.  2. Catapres 0.1 mg b.i.d.  3. Decadron 20 mg a day.  4. Hydralazine 25 mg t.i.d.  5. Metoprolol 50 mg b.i.d.  6. Alkeran 10 mg a day.  7. Avelox 40 mg a day.  8. Protonix 40 mg b.i.d.  9. Nitro paste every 6  hours.   SOCIAL HISTORY:  Nonsmoker, nondrinker.  He is not married.  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   FAMILY HISTORY:  Hypertension, coronary disease.  Father had diabetes  and lung cancer.  He has no family history of renal disease.   REVIEW OF SYSTEMS:  Appetite was good up until recently. His shortness  of breath has improved. No chest pains or chest pressures.  No recent  change in bowel habits.  He has chronic left hip pain due to the  destructive lesion no nerve no new neuropathic symptoms. He had no  problems with dysuria, but currently has a Foley catheter in.   REVIEW OF SYSTEMS:  Unremarkable.   PHYSICAL EXAMINATION:  VITAL SIGNS:  Blood pressure 152/100, pulse 72,  temperature 97.4.  GENERAL:  This is a 50 year old, black male who is awake and alert,  though with some shortness of breath.  NECK:  Reveals no JVD and no lymphadenopathy.  LUNGS:  Clear to auscultation.  HEART:  Regular rate and rhythm without murmur or gallop.  ABDOMEN:  Positive bowel sounds, nontender, nondistended.  No  hepatosplenomegaly.  Liver is not  palpable.  EXTREMITIES:  Reveals trace to 1+ edema.  There is a PIC line in his  right arm. Pulses are 2/4 and equal  throughout.  NEUROLOGIC:  Cranial nerves intact.  He is oriented x3.  No asterixis.   LABORATORY DATA:  Sodium 123, potassium 4.9 bicarb 17, BUN 83, current  3.3, AST 77, ALT 138. Hemoglobin from 06/01/2007  8.7, white count 6.1,  platelet count 185,000.   IMPRESSION:  1. Renal insufficiency this is most likely secondary to myeloma.  2. Hyponatremia probably secondary to an ADH effect.  Would certainly      wonder about a central lesion in addition to his hip lesion  3. IgG kappa multiple myeloma.  4. Hypercalcemia secondary to myeloma, which has improved.   RECOMMENDATIONS:  1. IV Lasix.  2. Decrease normal saline 50 mL now.  3. Check a UA, urine sodium, urine creatinine, urine osmolality.  4.  Discontinue Avelox.  5. Discontinue nitro paste, the patient to increased hydralazine 50 mg      t.i.d. DC Fleets I did discuss the possibility of  hemodialysis if      his renal function is to worsened.  He of course does not want      dialysis but he would be willing to do it to stay alive.  We will      show him  the dialysis video tapes.  Daily serum creatinine.  We      will add phosphorous to the labs for tomorrow morning.  Check CBC      in the morning.   Thank you much for this consult.  I will follow the patient with you.           ______________________________  Dyke Maes, M.D.     MTM/MEDQ  D:  06/05/2007  T:  06/06/2007  Job:  [id number]

## 2011-02-16 LAB — CBC
HCT: 23.9 — ABNORMAL LOW
HCT: 25.5 — ABNORMAL LOW
HCT: 25.6 — ABNORMAL LOW
HCT: 26.5 — ABNORMAL LOW
HCT: 26.7 — ABNORMAL LOW
HCT: 26.8 — ABNORMAL LOW
HCT: 26.7 — ABNORMAL LOW
HCT: 26.8 — ABNORMAL LOW
HCT: 27.1 — ABNORMAL LOW
HCT: 28.4 — ABNORMAL LOW
HCT: 29.1 — ABNORMAL LOW
HCT: 31 — ABNORMAL LOW
Hemoglobin: 8.8 — ABNORMAL LOW
Hemoglobin: 9 — ABNORMAL LOW
Hemoglobin: 9 — ABNORMAL LOW
Hemoglobin: 9.1 — ABNORMAL LOW
Hemoglobin: 9.1 — ABNORMAL LOW
Hemoglobin: 9.3 — ABNORMAL LOW
Hemoglobin: 9.3 — ABNORMAL LOW
Hemoglobin: 10.5 — ABNORMAL LOW
Hemoglobin: 9 — ABNORMAL LOW
Hemoglobin: 9.3 — ABNORMAL LOW
Hemoglobin: 9.6 — ABNORMAL LOW
Hemoglobin: 9.8 — ABNORMAL LOW
MCHC: 33.6
MCHC: 33.9
MCHC: 34
MCHC: 34.1
MCHC: 34.1
MCHC: 35.2
MCHC: 33.7
MCHC: 33.8
MCHC: 33.8
MCHC: 34
MCHC: 34.4
MCV: 87.5
MCV: 87.7
MCV: 87.7
MCV: 88.1
MCV: 88.2
MCV: 88.5
MCV: 88.6
MCV: 86.1
MCV: 87.7
MCV: 87.8
MCV: 88.4
MCV: 88.5
MCV: 88.7
MCV: 88.9
Platelets: 177
Platelets: 185
Platelets: 240
Platelets: 257
Platelets: 267
Platelets: 102 — ABNORMAL LOW
Platelets: 135 — ABNORMAL LOW
Platelets: 194
Platelets: 195
Platelets: 241
Platelets: 246
Platelets: 86 — ABNORMAL LOW
Platelets: 86 — ABNORMAL LOW
Platelets: 87 — ABNORMAL LOW
RBC: 2.73 — ABNORMAL LOW
RBC: 2.9 — ABNORMAL LOW
RBC: 2.92 — ABNORMAL LOW
RBC: 2.99 — ABNORMAL LOW
RBC: 3.03 — ABNORMAL LOW
RBC: 3.07 — ABNORMAL LOW
RBC: 3.08 — ABNORMAL LOW
RBC: 3.02 — ABNORMAL LOW
RBC: 3.49 — ABNORMAL LOW
RDW: 16.4 — ABNORMAL HIGH
RDW: 16.7 — ABNORMAL HIGH
RDW: 16.7 — ABNORMAL HIGH
RDW: 17 — ABNORMAL HIGH
RDW: 17.1 — ABNORMAL HIGH
RDW: 16.5 — ABNORMAL HIGH
RDW: 16.8 — ABNORMAL HIGH
RDW: 16.8 — ABNORMAL HIGH
RDW: 16.9 — ABNORMAL HIGH
RDW: 16.9 — ABNORMAL HIGH
RDW: 17 — ABNORMAL HIGH
WBC: 10
WBC: 5.6
WBC: 6
WBC: 8.4
WBC: 9.3
WBC: 4.3
WBC: 4.3
WBC: 7.7
WBC: 7.8
WBC: 7.9
WBC: 8.1
WBC: 8.4
WBC: 8.7

## 2011-02-16 LAB — COMPREHENSIVE METABOLIC PANEL
ALT: 131 — ABNORMAL HIGH
ALT: 135 — ABNORMAL HIGH
ALT: 20
ALT: 48
ALT: 51
ALT: 60 — ABNORMAL HIGH
ALT: 83 — ABNORMAL HIGH
ALT: 41
AST: 24
AST: 24
AST: 27
AST: 42 — ABNORMAL HIGH
AST: 98 — ABNORMAL HIGH
AST: 203 — ABNORMAL HIGH
AST: 22
AST: 316 — ABNORMAL HIGH
Albumin: 1.8 — ABNORMAL LOW
Albumin: 2.1 — ABNORMAL LOW
Albumin: 2.2 — ABNORMAL LOW
Albumin: 2.3 — ABNORMAL LOW
Albumin: 2.4 — ABNORMAL LOW
Albumin: 2.4 — ABNORMAL LOW
Albumin: 2.3 — ABNORMAL LOW
Albumin: 2.4 — ABNORMAL LOW
Alkaline Phosphatase: 32 — ABNORMAL LOW
Alkaline Phosphatase: 37 — ABNORMAL LOW
Alkaline Phosphatase: 41
Alkaline Phosphatase: 46
Alkaline Phosphatase: 48
Alkaline Phosphatase: 50
Alkaline Phosphatase: 50
Alkaline Phosphatase: 42
BUN: 106 — ABNORMAL HIGH
BUN: 109 — ABNORMAL HIGH
BUN: 26 — ABNORMAL HIGH
BUN: 28 — ABNORMAL HIGH
BUN: 96 — ABNORMAL HIGH
BUN: 97 — ABNORMAL HIGH
CO2: 18 — ABNORMAL LOW
CO2: 18 — ABNORMAL LOW
CO2: 19
CO2: 20
CO2: 21
CO2: 21
CO2: 22
CO2: 23
CO2: 23
CO2: 25
CO2: 21
Calcium: 6.7 — ABNORMAL LOW
Calcium: 6.7 — ABNORMAL LOW
Calcium: 6.8 — ABNORMAL LOW
Calcium: 7.2 — ABNORMAL LOW
Calcium: 7.2 — ABNORMAL LOW
Calcium: 7.3 — ABNORMAL LOW
Calcium: 7.7 — ABNORMAL LOW
Calcium: 7.3 — ABNORMAL LOW
Calcium: 7.3 — ABNORMAL LOW
Chloride: 101
Chloride: 101
Chloride: 104
Chloride: 106
Chloride: 107
Chloride: 99
Chloride: 94 — ABNORMAL LOW
Creatinine, Ser: 1.96 — ABNORMAL HIGH
Creatinine, Ser: 2.11 — ABNORMAL HIGH
Creatinine, Ser: 2.33 — ABNORMAL HIGH
Creatinine, Ser: 2.71 — ABNORMAL HIGH
Creatinine, Ser: 3.24 — ABNORMAL HIGH
Creatinine, Ser: 5.28 — ABNORMAL HIGH
Creatinine, Ser: 5.3 — ABNORMAL HIGH
Creatinine, Ser: 5.32 — ABNORMAL HIGH
Creatinine, Ser: 7.18 — ABNORMAL HIGH
Creatinine, Ser: 5.89 — ABNORMAL HIGH
Creatinine, Ser: 7.06 — ABNORMAL HIGH
Creatinine, Ser: 9.36 — ABNORMAL HIGH
GFR calc Af Amer: 12 — ABNORMAL LOW
GFR calc Af Amer: 13 — ABNORMAL LOW
GFR calc Af Amer: 14 — ABNORMAL LOW
GFR calc Af Amer: 14 — ABNORMAL LOW
GFR calc Af Amer: 37 — ABNORMAL LOW
GFR calc Af Amer: 41 — ABNORMAL LOW
GFR calc Af Amer: 45 — ABNORMAL LOW
GFR calc Af Amer: 10 — ABNORMAL LOW
GFR calc Af Amer: 13 — ABNORMAL LOW
GFR calc Af Amer: 7 — ABNORMAL LOW
GFR calc non Af Amer: 11 — ABNORMAL LOW
GFR calc non Af Amer: 11 — ABNORMAL LOW
GFR calc non Af Amer: 12 — ABNORMAL LOW
GFR calc non Af Amer: 12 — ABNORMAL LOW
GFR calc non Af Amer: 12 — ABNORMAL LOW
GFR calc non Af Amer: 25 — ABNORMAL LOW
GFR calc non Af Amer: 33 — ABNORMAL LOW
GFR calc non Af Amer: 37 — ABNORMAL LOW
GFR calc non Af Amer: 8 — ABNORMAL LOW
GFR calc non Af Amer: 6 — ABNORMAL LOW
Glucose, Bld: 100 — ABNORMAL HIGH
Glucose, Bld: 129 — ABNORMAL HIGH
Glucose, Bld: 135 — ABNORMAL HIGH
Glucose, Bld: 144 — ABNORMAL HIGH
Glucose, Bld: 148 — ABNORMAL HIGH
Glucose, Bld: 161 — ABNORMAL HIGH
Glucose, Bld: 174 — ABNORMAL HIGH
Potassium: 3.9
Potassium: 4.2
Potassium: 4.9
Potassium: 4.9
Potassium: 5
Potassium: 5.2 — ABNORMAL HIGH
Potassium: 5.3 — ABNORMAL HIGH
Potassium: 5.3 — ABNORMAL HIGH
Sodium: 121 — ABNORMAL LOW
Sodium: 122 — ABNORMAL LOW
Sodium: 127 — ABNORMAL LOW
Sodium: 128 — ABNORMAL LOW
Sodium: 128 — ABNORMAL LOW
Sodium: 128 — ABNORMAL LOW
Sodium: 128 — ABNORMAL LOW
Total Bilirubin: 0.4
Total Bilirubin: 0.5
Total Bilirubin: 0.6
Total Bilirubin: 0.8
Total Bilirubin: 0.8
Total Bilirubin: 0.6
Total Protein: 10.2 — ABNORMAL HIGH
Total Protein: 10.7 — ABNORMAL HIGH
Total Protein: 12.1 — ABNORMAL HIGH
Total Protein: 12.3 — ABNORMAL HIGH
Total Protein: 13.4 — ABNORMAL HIGH
Total Protein: 10.4 — ABNORMAL HIGH

## 2011-02-16 LAB — CREATININE CLEARANCE, URINE, 24 HOUR
Collection Interval-CRCL: 24
Creatinine Clearance: 62 — ABNORMAL LOW
Creatinine, 24H Ur: 2077 — ABNORMAL HIGH
Creatinine, Urine: 57.7
Creatinine: 2.33 — ABNORMAL HIGH
Urine Total Volume-CRCL: 3600

## 2011-02-16 LAB — RENAL FUNCTION PANEL
Albumin: 2.2 — ABNORMAL LOW
Albumin: 2.2 — ABNORMAL LOW
Albumin: 2.3 — ABNORMAL LOW
Albumin: 2.3 — ABNORMAL LOW
Albumin: 2.3 — ABNORMAL LOW
BUN: 104 — ABNORMAL HIGH
BUN: 101 — ABNORMAL HIGH
BUN: 105 — ABNORMAL HIGH
BUN: 106 — ABNORMAL HIGH
BUN: 114 — ABNORMAL HIGH
BUN: 120 — ABNORMAL HIGH
BUN: 143 — ABNORMAL HIGH
BUN: 86 — ABNORMAL HIGH
CO2: 23
CO2: 25
CO2: 26
Calcium: 6.5 — ABNORMAL LOW
Calcium: 6.5 — ABNORMAL LOW
Calcium: 7 — ABNORMAL LOW
Calcium: 7.3 — ABNORMAL LOW
Chloride: 98
Chloride: 100
Chloride: 100
Chloride: 105
Chloride: 98
Chloride: 98
Creatinine, Ser: 5.92 — ABNORMAL HIGH
Creatinine, Ser: 3.74 — ABNORMAL HIGH
Creatinine, Ser: 5.54 — ABNORMAL HIGH
Creatinine, Ser: 6.01 — ABNORMAL HIGH
Creatinine, Ser: 8.22 — ABNORMAL HIGH
GFR calc Af Amer: 15 — ABNORMAL LOW
GFR calc non Af Amer: 11 — ABNORMAL LOW
GFR calc non Af Amer: 12 — ABNORMAL LOW
GFR calc non Af Amer: 8 — ABNORMAL LOW
Glucose, Bld: 106 — ABNORMAL HIGH
Glucose, Bld: 109 — ABNORMAL HIGH
Glucose, Bld: 117 — ABNORMAL HIGH
Glucose, Bld: 121 — ABNORMAL HIGH
Glucose, Bld: 123 — ABNORMAL HIGH
Glucose, Bld: 135 — ABNORMAL HIGH
Glucose, Bld: 89
Phosphorus: 5.6 — ABNORMAL HIGH
Phosphorus: 3.8
Phosphorus: 3.9
Phosphorus: 6.1 — ABNORMAL HIGH
Phosphorus: 8.5 — ABNORMAL HIGH
Potassium: 4
Potassium: 4.6
Potassium: 4.6
Potassium: 4.9
Potassium: 5.2 — ABNORMAL HIGH
Potassium: 5.3 — ABNORMAL HIGH
Potassium: 5.5 — ABNORMAL HIGH
Sodium: 125 — ABNORMAL LOW
Sodium: 129 — ABNORMAL LOW
Sodium: 130 — ABNORMAL LOW
Sodium: 130 — ABNORMAL LOW

## 2011-02-16 LAB — BASIC METABOLIC PANEL
BUN: 72 — ABNORMAL HIGH
CO2: 17 — ABNORMAL LOW
CO2: 22
CO2: 25
Calcium: 10.9 — ABNORMAL HIGH
Calcium: 6.9 — ABNORMAL LOW
Creatinine, Ser: 2.62 — ABNORMAL HIGH
GFR calc Af Amer: 32 — ABNORMAL LOW
GFR calc non Af Amer: 26 — ABNORMAL LOW
GFR calc non Af Amer: 13 — ABNORMAL LOW
Glucose, Bld: 112 — ABNORMAL HIGH
Glucose, Bld: 98
Glucose, Bld: 141 — ABNORMAL HIGH
Potassium: 4.9
Potassium: 4.1
Sodium: 123 — ABNORMAL LOW
Sodium: 127 — ABNORMAL LOW
Sodium: 125 — ABNORMAL LOW

## 2011-02-16 LAB — UIFE/LIGHT CHAINS/TP QN, 24-HR UR
Alpha 1, Urine: DETECTED — AB
Free Kappa Lt Chains,Ur: 1300 — ABNORMAL HIGH (ref 0.04–1.51)
Free Kappa/Lambda Ratio: 1666.67 — ABNORMAL HIGH (ref 0.46–4.00)
Free Lambda Excretion/Day: 28.08
Free Lambda Lt Chains,Ur: 0.78 (ref 0.08–1.01)
Gamma Globulin, Urine: DETECTED — AB
Time: 24
Total Protein, Urine-Ur/day: 47304 — ABNORMAL HIGH (ref 10–140)
Volume, Urine: 3600

## 2011-02-16 LAB — IRON AND TIBC
Iron: 27 — ABNORMAL LOW
Saturation Ratios: 12 — ABNORMAL LOW
TIBC: 220

## 2011-02-16 LAB — DIFFERENTIAL
Basophils Absolute: 0
Basophils Absolute: 0
Basophils Absolute: 0
Basophils Relative: 0
Basophils Relative: 0
Eosinophils Absolute: 0
Eosinophils Absolute: 0
Eosinophils Absolute: 0
Eosinophils Relative: 0
Eosinophils Relative: 0
Eosinophils Relative: 0
Eosinophils Relative: 0
Eosinophils Relative: 1
Eosinophils Relative: 1
Lymphocytes Relative: 3 — ABNORMAL LOW
Lymphocytes Relative: 4 — ABNORMAL LOW
Lymphocytes Relative: 8 — ABNORMAL LOW
Lymphocytes Relative: 16
Lymphocytes Relative: 2 — ABNORMAL LOW
Lymphs Abs: 0.3 — ABNORMAL LOW
Lymphs Abs: 0.3 — ABNORMAL LOW
Lymphs Abs: 0.5 — ABNORMAL LOW
Lymphs Abs: 0.2 — ABNORMAL LOW
Lymphs Abs: 0.6 — ABNORMAL LOW
Lymphs Abs: 0.7
Monocytes Absolute: 0.3
Monocytes Absolute: 0.4
Monocytes Absolute: 0.4
Monocytes Absolute: 0.3
Monocytes Absolute: 0.5
Monocytes Relative: 4
Neutro Abs: 8.2 — ABNORMAL HIGH
Neutro Abs: 8.8 — ABNORMAL HIGH
Neutrophils Relative %: 91 — ABNORMAL HIGH

## 2011-02-16 LAB — CULTURE, BLOOD (ROUTINE X 2)

## 2011-02-16 LAB — URINALYSIS, ROUTINE W REFLEX MICROSCOPIC
Glucose, UA: NEGATIVE
Protein, ur: 100 — AB
Specific Gravity, Urine: 1.017
Specific Gravity, Urine: 1.016
Urobilinogen, UA: 0.2
pH: 5

## 2011-02-16 LAB — TYPE AND SCREEN: Antibody Screen: NEGATIVE

## 2011-02-16 LAB — PROTIME-INR
INR: 1.3
Prothrombin Time: 15.2
Prothrombin Time: 17.1 — ABNORMAL HIGH
Prothrombin Time: 17.7 — ABNORMAL HIGH

## 2011-02-16 LAB — PHOSPHORUS
Phosphorus: 4.7 — ABNORMAL HIGH
Phosphorus: 6.5 — ABNORMAL HIGH
Phosphorus: 7.3 — ABNORMAL HIGH

## 2011-02-16 LAB — CROSSMATCH
ABO/RH(D): O POS
Antibody Screen: NEGATIVE

## 2011-02-16 LAB — URINE MICROSCOPIC-ADD ON

## 2011-02-16 LAB — KAPPA/LAMBDA LIGHT CHAINS: Lambda free light chains: 1.08 (ref 0.57–2.63)

## 2011-02-16 LAB — HEPARIN ANTI-XA: Heparin LMW: 0.42

## 2011-02-16 LAB — PREPARE PLATELET PHERESIS

## 2011-02-16 LAB — URINE CULTURE: Colony Count: 9000

## 2011-02-16 LAB — PREPARE RBC (CROSSMATCH)

## 2011-02-16 LAB — VISCOSITY, SERUM: Viscosity, Serum: 2.3 mPa.S — ABNORMAL HIGH (ref 1.1–2.0)

## 2011-02-16 LAB — PTH, INTACT AND CALCIUM
Calcium, Total (PTH): 6.7 — ABNORMAL LOW
PTH: 339 — ABNORMAL HIGH

## 2011-02-16 LAB — OSMOLALITY, URINE: Osmolality, Ur: 408

## 2011-02-16 LAB — LACTATE DEHYDROGENASE: LDH: 317 — ABNORMAL HIGH

## 2011-02-16 LAB — ABO/RH: ABO/RH(D): O POS

## 2011-02-16 LAB — APTT: aPTT: 36

## 2011-02-16 LAB — SODIUM, URINE, RANDOM: Sodium, Ur: 25

## 2011-02-17 LAB — COMPREHENSIVE METABOLIC PANEL
ALT: 23
ALT: 29
ALT: 41
ALT: 26
ALT: 30
AST: 27
AST: 88 — ABNORMAL HIGH
AST: 21
AST: 22
Albumin: 2.3 — ABNORMAL LOW
Albumin: 2.4 — ABNORMAL LOW
Albumin: 2.6 — ABNORMAL LOW
Albumin: 1.9 — ABNORMAL LOW
Albumin: 2 — ABNORMAL LOW
Alkaline Phosphatase: 39
Alkaline Phosphatase: 41
Alkaline Phosphatase: 47
BUN: 119 — ABNORMAL HIGH
BUN: 51 — ABNORMAL HIGH
BUN: 65 — ABNORMAL HIGH
CO2: 22
CO2: 27
CO2: 25
CO2: 27
Calcium: 10.6 — ABNORMAL HIGH
Calcium: 7.6 — ABNORMAL LOW
Calcium: 8.5
Calcium: 7.4 — ABNORMAL LOW
Chloride: 100
Chloride: 98
Chloride: 99
Chloride: 99
Creatinine, Ser: 10.4 — ABNORMAL HIGH
Creatinine, Ser: 7.3 — ABNORMAL HIGH
Creatinine, Ser: 9.29 — ABNORMAL HIGH
Creatinine, Ser: 7.99 — ABNORMAL HIGH
Creatinine, Ser: 8.74 — ABNORMAL HIGH
GFR calc Af Amer: 7 — ABNORMAL LOW
GFR calc Af Amer: 9 — ABNORMAL LOW
GFR calc Af Amer: 9 — ABNORMAL LOW
GFR calc Af Amer: 9 — ABNORMAL LOW
GFR calc non Af Amer: 5 — ABNORMAL LOW
GFR calc non Af Amer: 8 — ABNORMAL LOW
GFR calc non Af Amer: 7 — ABNORMAL LOW
Glucose, Bld: 125 — ABNORMAL HIGH
Glucose, Bld: 138 — ABNORMAL HIGH
Glucose, Bld: 89
Glucose, Bld: 100 — ABNORMAL HIGH
Glucose, Bld: 96
Potassium: 3.5
Potassium: 3.7
Potassium: 4.1
Sodium: 126 — ABNORMAL LOW
Sodium: 127 — ABNORMAL LOW
Sodium: 130 — ABNORMAL LOW
Sodium: 133 — ABNORMAL LOW
Sodium: 133 — ABNORMAL LOW
Total Bilirubin: 0.7
Total Bilirubin: 0.7
Total Bilirubin: 0.5
Total Bilirubin: 0.6
Total Bilirubin: 0.7
Total Protein: 10.7 — ABNORMAL HIGH
Total Protein: 11.3 — ABNORMAL HIGH
Total Protein: 11.5 — ABNORMAL HIGH
Total Protein: 11.9 — ABNORMAL HIGH
Total Protein: 9 — ABNORMAL HIGH
Total Protein: 9.1 — ABNORMAL HIGH
Total Protein: 9.8 — ABNORMAL HIGH

## 2011-02-17 LAB — CROSSMATCH
ABO/RH(D): O POS
Antibody Screen: NEGATIVE

## 2011-02-17 LAB — DIFFERENTIAL
Basophils Absolute: 0
Basophils Absolute: 0
Basophils Absolute: 0
Basophils Absolute: 0
Basophils Relative: 0
Eosinophils Absolute: 0
Eosinophils Absolute: 0
Eosinophils Absolute: 0
Eosinophils Absolute: 0
Eosinophils Absolute: 0
Eosinophils Absolute: 0
Eosinophils Relative: 0
Eosinophils Relative: 0
Eosinophils Relative: 0
Eosinophils Relative: 0
Lymphocytes Relative: 4 — ABNORMAL LOW
Lymphocytes Relative: 4 — ABNORMAL LOW
Lymphocytes Relative: 8 — ABNORMAL LOW
Lymphocytes Relative: 2 — ABNORMAL LOW
Lymphocytes Relative: 3 — ABNORMAL LOW
Lymphs Abs: 0.2 — ABNORMAL LOW
Lymphs Abs: 0.1 — ABNORMAL LOW
Lymphs Abs: 0.1 — ABNORMAL LOW
Monocytes Absolute: 0.2
Monocytes Absolute: 0.2
Monocytes Absolute: 0.2
Monocytes Relative: 4
Monocytes Relative: 6
Neutro Abs: 2.5
Neutro Abs: 5
Neutrophils Relative %: 84 — ABNORMAL HIGH
Neutrophils Relative %: 88 — ABNORMAL HIGH
Neutrophils Relative %: 89 — ABNORMAL HIGH
Neutrophils Relative %: 88 — ABNORMAL HIGH
Smear Review: NORMAL

## 2011-02-17 LAB — CBC
HCT: 21.4 — ABNORMAL LOW
HCT: 22.5 — ABNORMAL LOW
HCT: 25.2 — ABNORMAL LOW
HCT: 23.3 — ABNORMAL LOW
HCT: 23.8 — ABNORMAL LOW
HCT: 24.8 — ABNORMAL LOW
HCT: 25 — ABNORMAL LOW
HCT: 25.2 — ABNORMAL LOW
Hemoglobin: 7.5 — CL
Hemoglobin: 7.9 — CL
Hemoglobin: 8.7 — ABNORMAL LOW
Hemoglobin: 9.1 — ABNORMAL LOW
Hemoglobin: 8.4 — ABNORMAL LOW
Hemoglobin: 8.5 — ABNORMAL LOW
Hemoglobin: 8.7 — ABNORMAL LOW
MCHC: 33.6
MCHC: 33.8
MCHC: 34.5
MCHC: 34.6
MCHC: 34.8
MCHC: 34.9
MCHC: 33.5
MCV: 86.7
MCV: 86.8
MCV: 87
MCV: 87.6
MCV: 87.8
MCV: 88.1
MCV: 87.4
MCV: 87.4
MCV: 87.6
MCV: 87.8
MCV: 87.8
Platelets: 101 — ABNORMAL LOW
Platelets: 107 — ABNORMAL LOW
Platelets: 108 — ABNORMAL LOW
Platelets: 116 — ABNORMAL LOW
Platelets: 148 — ABNORMAL LOW
Platelets: 156
Platelets: 195
Platelets: 214
Platelets: 237
RBC: 2.56 — ABNORMAL LOW
RBC: 2.8 — ABNORMAL LOW
RBC: 3.02 — ABNORMAL LOW
RBC: 3.22 — ABNORMAL LOW
RBC: 2.72 — ABNORMAL LOW
RBC: 2.84 — ABNORMAL LOW
RBC: 2.88 — ABNORMAL LOW
RBC: 2.91 — ABNORMAL LOW
RDW: 16.4 — ABNORMAL HIGH
RDW: 16.8 — ABNORMAL HIGH
RDW: 16.9 — ABNORMAL HIGH
RDW: 17 — ABNORMAL HIGH
RDW: 17.1 — ABNORMAL HIGH
RDW: 17.1 — ABNORMAL HIGH
RDW: 16.5 — ABNORMAL HIGH
RDW: 16.6 — ABNORMAL HIGH
RDW: 17.4 — ABNORMAL HIGH
WBC: 2.9 — ABNORMAL LOW
WBC: 4.6
WBC: 4.6
WBC: 4.9
WBC: 5.4
WBC: 5.8
WBC: 6.6

## 2011-02-17 LAB — RENAL FUNCTION PANEL
Albumin: 2 — ABNORMAL LOW
Albumin: 2 — ABNORMAL LOW
BUN: 56 — ABNORMAL HIGH
BUN: 82 — ABNORMAL HIGH
BUN: 83 — ABNORMAL HIGH
CO2: 24
CO2: 20
Calcium: 8.7
Calcium: 6.3 — CL
Calcium: 7.1 — ABNORMAL LOW
Chloride: 96
Chloride: 97
Creatinine, Ser: 10.62 — ABNORMAL HIGH
Creatinine, Ser: 8.95 — ABNORMAL HIGH
GFR calc Af Amer: 9 — ABNORMAL LOW
GFR calc Af Amer: 6 — ABNORMAL LOW
GFR calc non Af Amer: 7 — ABNORMAL LOW
GFR calc non Af Amer: 5 — ABNORMAL LOW
Glucose, Bld: 102 — ABNORMAL HIGH
Glucose, Bld: 140 — ABNORMAL HIGH
Glucose, Bld: 85
Phosphorus: 3.4
Phosphorus: 6.5 — ABNORMAL HIGH
Potassium: 3.6
Potassium: 5.3 — ABNORMAL HIGH
Sodium: 124 — ABNORMAL LOW
Sodium: 128 — ABNORMAL LOW

## 2011-02-17 LAB — BASIC METABOLIC PANEL
BUN: 39 — ABNORMAL HIGH
CO2: 28
Calcium: 8.9
Chloride: 96
Creatinine, Ser: 5.77 — ABNORMAL HIGH
GFR calc Af Amer: 13 — ABNORMAL LOW

## 2011-02-17 LAB — CULTURE, BLOOD (ROUTINE X 2)
Culture: NO GROWTH
Culture: NO GROWTH
Culture: NO GROWTH

## 2011-02-17 LAB — RETICULOCYTES
RBC.: 2.7 — ABNORMAL LOW
Retic Count, Absolute: 16.2 — ABNORMAL LOW
Retic Ct Pct: 0.6

## 2011-02-17 LAB — LIPASE, BLOOD: Lipase: 55

## 2011-02-17 LAB — PHOSPHORUS
Phosphorus: 4.8 — ABNORMAL HIGH
Phosphorus: 5.9 — ABNORMAL HIGH

## 2011-02-17 LAB — IRON AND TIBC: Saturation Ratios: 25

## 2011-02-17 LAB — KAPPA/LAMBDA LIGHT CHAINS
Kappa free light chain: 272 — ABNORMAL HIGH (ref 0.33–1.94)
Lambda free light chains: 1.13 (ref 0.57–2.63)
Lambda free light chains: 0.86 (ref 0.57–2.63)

## 2011-02-17 LAB — VANCOMYCIN, RANDOM: Vancomycin Rm: 18

## 2011-02-17 LAB — PTH, INTACT AND CALCIUM
Calcium, Total (PTH): 7.2 — ABNORMAL LOW
PTH: 623.1 — ABNORMAL HIGH

## 2011-02-17 LAB — FERRITIN: Ferritin: 2175 — ABNORMAL HIGH (ref 22–322)

## 2011-02-17 LAB — PROTIME-INR
INR: 1
Prothrombin Time: 13.1

## 2011-02-17 LAB — LACTATE DEHYDROGENASE: LDH: 280 — ABNORMAL HIGH

## 2011-02-17 LAB — CALCIUM, IONIZED: Calcium, Ion: 0.81 — ABNORMAL LOW

## 2011-03-03 LAB — CBC
HCT: 16.3 — ABNORMAL LOW
HCT: 21.6 — ABNORMAL LOW
HCT: 25.3 — ABNORMAL LOW
Hemoglobin: 7.6 — CL
Hemoglobin: 9 — ABNORMAL LOW
MCHC: 34.4
MCHC: 35
MCV: 86.8
MCV: 89.2
Platelets: 165
Platelets: 177
RBC: 1.83 — ABNORMAL LOW
RDW: 17 — ABNORMAL HIGH
RDW: 17.6 — ABNORMAL HIGH
WBC: 7.2

## 2011-03-03 LAB — CROSSMATCH

## 2011-03-03 LAB — PROTEIN ELECTROPH W RFLX QUANT IMMUNOGLOBULINS
Albumin ELP: 28.1 — ABNORMAL LOW
Alpha-1-Globulin: 4.6
Beta 2: 3.3
Total Protein ELP: 14.7 — ABNORMAL HIGH

## 2011-03-03 LAB — URINE CULTURE
Colony Count: NO GROWTH
Culture: NO GROWTH

## 2011-03-03 LAB — CARDIAC PANEL(CRET KIN+CKTOT+MB+TROPI)
CK, MB: 1.3
CK, MB: 1.8
Relative Index: 0.4
Relative Index: 0.4
Total CK: 319 — ABNORMAL HIGH
Troponin I: 0.11 — ABNORMAL HIGH
Troponin I: 0.12 — ABNORMAL HIGH
Troponin I: 0.16 — ABNORMAL HIGH

## 2011-03-03 LAB — TSH
TSH: 2.789
TSH: 3.741

## 2011-03-03 LAB — URINALYSIS, ROUTINE W REFLEX MICROSCOPIC
Bilirubin Urine: NEGATIVE
Ketones, ur: NEGATIVE
Nitrite: NEGATIVE
Urobilinogen, UA: 0.2

## 2011-03-03 LAB — PREPARE RBC (CROSSMATCH)

## 2011-03-03 LAB — DIFFERENTIAL
Basophils Absolute: 0
Basophils Absolute: 0.1
Basophils Relative: 1
Lymphocytes Relative: 11 — ABNORMAL LOW
Lymphocytes Relative: 13
Lymphs Abs: 0.8
Neutro Abs: 4.9
Neutro Abs: 5.6
Neutrophils Relative %: 77
Neutrophils Relative %: 77

## 2011-03-03 LAB — PSA: PSA: 0.27

## 2011-03-03 LAB — COMPREHENSIVE METABOLIC PANEL
AST: 28
Alkaline Phosphatase: 37 — ABNORMAL LOW
BUN: 16
BUN: 22
CO2: 24
Calcium: 10.5
Chloride: 102
Chloride: 104
Creatinine, Ser: 2.51 — ABNORMAL HIGH
Creatinine, Ser: 2.52 — ABNORMAL HIGH
GFR calc Af Amer: 34 — ABNORMAL LOW
GFR calc non Af Amer: 28 — ABNORMAL LOW
Glucose, Bld: 109 — ABNORMAL HIGH
Potassium: 4.2
Total Bilirubin: 1
Total Bilirubin: 1
Total Protein: 12.8 — ABNORMAL HIGH

## 2011-03-03 LAB — LIPID PANEL
Cholesterol: 103
HDL: <10 — ABNORMAL LOW

## 2011-03-03 LAB — HEPATIC FUNCTION PANEL
AST: 27
Bilirubin, Direct: <0.1
Total Bilirubin: 0.7

## 2011-03-03 LAB — B-NATRIURETIC PEPTIDE (CONVERTED LAB): Pro B Natriuretic peptide (BNP): 808 — ABNORMAL HIGH

## 2011-03-03 LAB — BASIC METABOLIC PANEL
BUN: 18
Calcium: 10.6 — ABNORMAL HIGH
GFR calc non Af Amer: 30 — ABNORMAL LOW
Potassium: 3.8
Sodium: 127 — ABNORMAL LOW

## 2011-03-03 LAB — POCT CARDIAC MARKERS
Myoglobin, poc: >500
Operator id: 2725

## 2011-03-03 LAB — SEDIMENTATION RATE: Sed Rate: 118 — ABNORMAL HIGH

## 2011-03-03 LAB — HEMOGLOBIN AND HEMATOCRIT, BLOOD
HCT: 24.7 — ABNORMAL LOW
Hemoglobin: 7.7 — CL
Hemoglobin: 8.6 — ABNORMAL LOW

## 2011-03-03 LAB — CK TOTAL AND CKMB (NOT AT ARMC)
CK, MB: 1.2
Relative Index: 0.3
Total CK: 346 — ABNORMAL HIGH

## 2011-03-03 LAB — IMMUNOFIXATION ADD-ON

## 2011-03-03 LAB — PROTIME-INR
INR: 1.4
INR: 1.4
Prothrombin Time: 17.1 — ABNORMAL HIGH

## 2011-03-03 LAB — URINE MICROSCOPIC-ADD ON

## 2011-03-03 LAB — OCCULT BLOOD X 1 CARD TO LAB, STOOL: Fecal Occult Bld: NEGATIVE

## 2011-03-03 LAB — APTT
aPTT: 32
aPTT: 35

## 2011-03-03 LAB — IGG, IGA, IGM: IgM, Serum: 9 — ABNORMAL LOW

## 2011-03-03 LAB — TROPONIN I: Troponin I: 0.13 — ABNORMAL HIGH

## 2014-02-09 ENCOUNTER — Encounter: Payer: Self-pay | Admitting: *Deleted

## 2014-02-09 NOTE — Progress Notes (Signed)
[facility] Healthcare Advance Directives Clinical Social Work  Clinical Social Work met with patient during Multiple Myeloma support group to review and complete healthcare advance directives. Patient completed healthcare power of attorney and healthcare living will.    Clinical Social Worker notarized documents and made copies for patient/family. Clinical Social Worker will send documents to medical records to be scanned into patient's chart. Clinical Social Worker encouraged patient/family to contact with any additional questions or concerns.  Lauren Mullis, MSW, LCSW Clinical Social Worker [facility] [phone]      

## 2015-08-23 ENCOUNTER — Encounter: Payer: Self-pay | Admitting: General Practice

## 2015-08-23 NOTE — Progress Notes (Signed)
Spiritual Care Note  [name] by phone because he has uncharacteristically missed at least two Multiple Myeloma Support Group meetings in a row.  He was in good spirits, verbalized appreciation for call, and explained that he has been absent due to weather, not any other concerns.  He generally utilizes [employer] well, is aware on ongoing team availability for support as desired, and plans to attend April MM meeting.  [place], [place], [facility] Pager [phone] Voicemail  [phone]

## 2015-10-11 ENCOUNTER — Encounter: Payer: Self-pay | Admitting: General Practice

## 2015-10-12 NOTE — Progress Notes (Signed)
Spiritual Care Note  [name] stopped by the [facility] to check in about Multiple Myeloma Support Group.  He shared recent reasons for attending and not attending, as well as hopes that the group's energy will become more active and positive.  For him, humor and levity are very important in helping him feel comfortable and supported.  He tends to keep in touch with other group participants to provide encouragement and lift the mood.  He has utilized [employer] for social connection and support.  We plan to follow up in regular MM Support Group meeting in June.  [place], [place], [facility] Pager [phone] Voicemail [phone]

## 2015-11-12 ENCOUNTER — Encounter (HOSPITAL_COMMUNITY): Payer: Self-pay | Admitting: Emergency Medicine

## 2015-11-12 ENCOUNTER — Emergency Department (HOSPITAL_COMMUNITY)
Admission: EM | Admit: 2015-11-12 | Discharge: 2015-11-12 | Disposition: A | Discharge status: Home / Self Care | Payer: Medicaid Other | Attending: Emergency Medicine | Admitting: Emergency Medicine

## 2015-11-12 DIAGNOSIS — E1122 Type 2 diabetes mellitus with diabetic chronic kidney disease: Secondary | ICD-10-CM | POA: Diagnosis not present

## 2015-11-12 DIAGNOSIS — Z79899 Other long term (current) drug therapy: Secondary | ICD-10-CM | POA: Insufficient documentation

## 2015-11-12 DIAGNOSIS — N3001 Acute cystitis with hematuria: Secondary | ICD-10-CM | POA: Insufficient documentation

## 2015-11-12 DIAGNOSIS — R197 Diarrhea, unspecified: Secondary | ICD-10-CM

## 2015-11-12 DIAGNOSIS — N189 Chronic kidney disease, unspecified: Secondary | ICD-10-CM | POA: Insufficient documentation

## 2015-11-12 DIAGNOSIS — R112 Nausea with vomiting, unspecified: Secondary | ICD-10-CM | POA: Diagnosis present

## 2015-11-12 DIAGNOSIS — I129 Hypertensive chronic kidney disease with stage 1 through stage 4 chronic kidney disease, or unspecified chronic kidney disease: Secondary | ICD-10-CM | POA: Diagnosis not present

## 2015-11-12 DIAGNOSIS — N179 Acute kidney failure, unspecified: Secondary | ICD-10-CM

## 2015-11-12 DIAGNOSIS — Z7982 Long term (current) use of aspirin: Secondary | ICD-10-CM | POA: Insufficient documentation

## 2015-11-12 HISTORY — DX: Multiple myeloma not having achieved remission: C90.00

## 2015-11-12 HISTORY — DX: Essential (primary) hypertension: I10

## 2015-11-12 LAB — I-STAT CG4 LACTIC ACID, ED
Lactic Acid, Venous: 0.8 mmol/L (ref 0.5–2.0)
Lactic Acid, Venous: 0.92 mmol/L (ref 0.5–2.0)

## 2015-11-12 LAB — GASTROINTESTINAL PANEL BY PCR, STOOL (REPLACES STOOL CULTURE)

## 2015-11-12 LAB — CBC WITH DIFFERENTIAL/PLATELET
BASOS PCT: 0 %
Basophils Absolute: 0 10*3/uL (ref 0.0–0.1)
EOS ABS: 0 10*3/uL (ref 0.0–0.7)
EOS PCT: 0 %
HCT: 30.8 % — ABNORMAL LOW (ref 39.0–52.0)
HEMOGLOBIN: 10.2 g/dL — AB (ref 13.0–17.0)
LYMPHS ABS: 0.5 10*3/uL — AB (ref 0.7–4.0)
Lymphocytes Relative: 4 %
MCH: 30.4 pg (ref 26.0–34.0)
MCHC: 33.1 g/dL (ref 30.0–36.0)
MCV: 91.7 fL (ref 78.0–100.0)
Monocytes Absolute: 2 10*3/uL — ABNORMAL HIGH (ref 0.1–1.0)
Monocytes Relative: 16 %
NEUTROS PCT: 80 %
Neutro Abs: 9.9 10*3/uL — ABNORMAL HIGH (ref 1.7–7.7)
PLATELETS: 150 10*3/uL (ref 150–400)
RBC: 3.36 MIL/uL — AB (ref 4.22–5.81)
RDW: 15.9 % — ABNORMAL HIGH (ref 11.5–15.5)
WBC: 12.4 10*3/uL — AB (ref 4.0–10.5)

## 2015-11-12 LAB — COMPREHENSIVE METABOLIC PANEL
ALT: 38 U/L (ref 17–63)
AST: 27 U/L (ref 15–41)
Albumin: 3 g/dL — ABNORMAL LOW (ref 3.5–5.0)
Alkaline Phosphatase: 58 U/L (ref 38–126)
Anion gap: 9 (ref 5–15)
BUN: 50 mg/dL — ABNORMAL HIGH (ref 6–20)
CO2: 15 mmol/L — ABNORMAL LOW (ref 22–32)
Calcium: 8.3 mg/dL — ABNORMAL LOW (ref 8.9–10.3)
Chloride: 108 mmol/L (ref 101–111)
Creatinine, Ser: 6.34 mg/dL — ABNORMAL HIGH (ref 0.61–1.24)
GFR calc Af Amer: 10 mL/min — ABNORMAL LOW (ref >60)
GFR calc non Af Amer: 9 mL/min — ABNORMAL LOW (ref >60)
Glucose, Bld: 151 mg/dL — ABNORMAL HIGH (ref 65–99)
Potassium: 4.1 mmol/L (ref 3.5–5.1)
Sodium: 132 mmol/L — ABNORMAL LOW (ref 135–145)
Total Bilirubin: 0.7 mg/dL (ref 0.3–1.2)
Total Protein: 6.7 g/dL (ref 6.5–8.1)

## 2015-11-12 LAB — URINALYSIS, ROUTINE W REFLEX MICROSCOPIC
Bilirubin Urine: NEGATIVE
Glucose, UA: NEGATIVE mg/dL
Ketones, ur: NEGATIVE mg/dL
Nitrite: NEGATIVE
Protein, ur: 100 mg/dL — AB
Specific Gravity, Urine: 1.019 (ref 1.005–1.030)
pH: 5.5 (ref 5.0–8.0)

## 2015-11-12 LAB — C DIFFICILE QUICK SCREEN W PCR REFLEX
C DIFFICILE (CDIFF) INTERP: NEGATIVE
C DIFFICILE (CDIFF) TOXIN: NEGATIVE
C Diff antigen: NEGATIVE

## 2015-11-12 LAB — URINE MICROSCOPIC-ADD ON

## 2015-11-12 LAB — CBC
HCT: 29.6 % — ABNORMAL LOW (ref 39.0–52.0)
Hemoglobin: 9.8 g/dL — ABNORMAL LOW (ref 13.0–17.0)
MCH: 30.3 pg (ref 26.0–34.0)
MCHC: 33.1 g/dL (ref 30.0–36.0)
MCV: 91.6 fL (ref 78.0–100.0)
Platelets: 147 10*3/uL — ABNORMAL LOW (ref 150–400)
RBC: 3.23 MIL/uL — ABNORMAL LOW (ref 4.22–5.81)
RDW: 16.1 % — ABNORMAL HIGH (ref 11.5–15.5)
WBC: 12.2 10*3/uL — ABNORMAL HIGH (ref 4.0–10.5)

## 2015-11-12 LAB — MAGNESIUM: MAGNESIUM: 1.3 mg/dL — AB (ref 1.7–2.4)

## 2015-11-12 LAB — LIPASE, BLOOD: Lipase: 36 U/L (ref 11–51)

## 2015-11-12 MED ORDER — FAMOTIDINE IN NACL 20-0.9 MG/50ML-% IV SOLN
20.0000 mg | Freq: Once | INTRAVENOUS | Status: AC
  Administered 2015-11-12: 20 mg via INTRAVENOUS
  Filled 2015-11-12: qty 50

## 2015-11-12 MED ORDER — DEXTROSE 5 % IV SOLN
1.0000 g | Freq: Once | INTRAVENOUS | Status: AC
  Administered 2015-11-12: 1 g via INTRAVENOUS
  Filled 2015-11-12: qty 10

## 2015-11-12 MED ORDER — SODIUM BICARBONATE 650 MG PO TABS: 1300.0000 mg | ORAL_TABLET | Freq: Four times a day (QID) | ORAL | Status: DC

## 2015-11-12 MED ORDER — CEPHALEXIN 500 MG PO CAPS: 500.0000 mg | ORAL_CAPSULE | Freq: Two times a day (BID) | ORAL | Status: DC

## 2015-11-12 MED ORDER — ONDANSETRON 4 MG PO TBDP: 4.0000 mg | ORAL_TABLET | Freq: Three times a day (TID) | ORAL | Status: DC | PRN

## 2015-11-12 MED ORDER — MAGNESIUM OXIDE 400 MG PO TABS: 400.0000 mg | ORAL_TABLET | Freq: Two times a day (BID) | ORAL | Status: DC

## 2015-11-12 MED ORDER — SODIUM CHLORIDE 0.9 % IV BOLUS (SEPSIS)
1000.0000 mL | Freq: Once | INTRAVENOUS | Status: AC
  Administered 2015-11-12: 1000 mL via INTRAVENOUS

## 2015-11-12 MED ORDER — ONDANSETRON HCL 4 MG/2ML IJ SOLN
4.0000 mg | Freq: Once | INTRAMUSCULAR | Status: AC
  Administered 2015-11-12: 4 mg via INTRAVENOUS
  Filled 2015-11-12: qty 2

## 2015-11-12 MED ORDER — PROMETHAZINE HCL 25 MG PO TABS: 25.0000 mg | ORAL_TABLET | Freq: Four times a day (QID) | ORAL | Status: DC | PRN

## 2015-11-12 NOTE — ED Provider Notes (Signed)
CSN: [id number]     Arrival date & time 11/12/15  0719 History   First MD Initiated Contact with Patient 11/12/15 0735     Chief Complaint  Patient presents with  . Emesis     (Consider location/radiation/quality/duration/timing/severity/associated sxs/prior Treatment) HPI  Blood pressure 137/74, pulse 85, temperature 100.2 F (37.9 C), resp. rate 16, SpO2 98 %.  [name] is a 55 y.o. male complaining of nausea, vomiting, and diarrhea for 2 days. He is concerned that the shrimp pasta he ate Tuesday night caused the Wednesday morning onset of his symptoms. He states he vomits 3-6 times/day and has diarrhea about 10 times/day. He also complains of chills, fever, weakness, difficulty sleeping due to nausea, resulting fatigue, and mild mid-epigastric pain. He denies chest pain, headache, hematemesis, hematochezia, and dizziness. He does have an appetite, and claims his symptoms recur hours after eating. The patient has a history significant for multiple myeloma from 2009, for which he currently takes Revlimid  for chemotherapy. He states he normally experiences nausea and occasional vomiting, which is normally controlled by Zofran taken daily. The patient did take his Revlimid yesterday, but states he ran out of his magnesium oxide and sodium bicarb last Sunday, and questions whether that is related to his current condition. He has tried Catering manager, Robitussin, and tylenol in addition to QID Zofran for symptom control without much relief. He denies recent illness or antibiotic use, change in medications, and liver or pancreatic illness.   Past Medical History  Diagnosis Date  . Renal disorder   . Multiple myeloma (HCC)   . Diabetes mellitus without complication (HCC)   . Hypertension    Past Surgical History  Procedure Laterality Date  . Dialysis fistula creation    . Knee Left    No family history on file. Social History  Substance Use Topics  . Smoking status: Never Smoker    . Smokeless tobacco: None  . Alcohol Use: No    Review of Systems    Allergies  Review of patient's allergies indicates no known allergies.  Home Medications   Prior to Admission medications   Medication Sig Start Date End Date Taking? Authorizing Provider  amLODipine (NORVASC) 2.5 MG tablet Take 2.5 mg by mouth daily. 05/11/15  Yes Historical Provider, MD  aspirin (GOODSENSE ASPIRIN) 325 MG tablet Take 325 mg by mouth daily.   Yes Historical Provider, MD  cetirizine (ZYRTEC) 10 MG tablet Take 10 mg by mouth daily.   Yes Historical Provider, MD  Co-Enzyme Q-10 30 MG CAPS Take 30 mg by mouth daily.   Yes Historical Provider, MD  doxazosin (CARDURA) 4 MG tablet Take 4 mg by mouth at bedtime.   Yes Historical Provider, MD  escitalopram (LEXAPRO) 10 MG tablet Take 10 mg by mouth daily.   Yes Historical Provider, MD  fluticasone (FLONASE) 50 MCG/ACT nasal spray Place 1 spray into the nose daily.   Yes Historical Provider, MD  lenalidomide (REVLIMID) 5 MG capsule Take 5 mg by mouth daily. x21 days 11/08/15  Yes Historical Provider, MD  omeprazole (PRILOSEC) 20 MG capsule Take 20 mg by mouth daily.   Yes Historical Provider, MD  ondansetron (ZOFRAN) 8 MG tablet Take 8 mg by mouth every 8 (eight) hours as needed. Nausea / vomiting   Yes Historical Provider, MD  senna (SENOKOT) 8.6 MG tablet Take 1 tablet by mouth daily.   Yes Historical Provider, MD  cephALEXin (KEFLEX) 500 MG capsule Take 1 capsule (500 mg total)  by mouth 2 (two) times daily. 11/12/15   Jerita Wimbush, PA-C  magnesium oxide (MAG-OX) 400 MG tablet Take 1 tablet (400 mg total) by mouth 2 (two) times daily. 11/12/15   Raeleigh Guinn, PA-C  ondansetron (ZOFRAN ODT) 4 MG disintegrating tablet Take 1 tablet (4 mg total) by mouth every 8 (eight) hours as needed for nausea or vomiting. 11/12/15   Elmyra Ricks Julizza Sassone, PA-C  promethazine (PHENERGAN) 25 MG tablet Take 1 tablet (25 mg total) by mouth every 6 (six) hours as needed for nausea  or vomiting. 11/12/15   Elmyra Ricks Ellese Julius, PA-C  sodium bicarbonate 650 MG tablet Take 2 tablets (1,300 mg total) by mouth 4 (four) times daily. 11/12/15   Doree Kuehne, PA-C   BP 116/80 mmHg  Pulse 79  Temp(Src) 99.7 F (37.6 C) (Oral)  Resp 23  SpO2 97% Physical Exam  Constitutional: He is oriented to person, place, and time. He appears well-developed and well-nourished. No distress.  HENT:  Head: Normocephalic and atraumatic.  Mouth/Throat: Oropharynx is clear and moist.  Eyes: Conjunctivae and EOM are normal. Pupils are equal, round, and reactive to light.  Neck: Normal range of motion.  Cardiovascular: Normal rate, regular rhythm and intact distal pulses.   Fistula to left antecubital area.  Pulmonary/Chest: Effort normal and breath sounds normal.  Abdominal: Soft. Bowel sounds are normal. He exhibits no distension. There is no tenderness. There is no rebound and no guarding.  Musculoskeletal: Normal range of motion.  Neurological: He is alert and oriented to person, place, and time.  Skin: He is not diaphoretic.  Psychiatric: He has a normal mood and affect.  Nursing note and vitals reviewed.   ED Course  Procedures (including critical care time) Labs Review Labs Reviewed  COMPREHENSIVE METABOLIC PANEL - Abnormal; Notable for the following:    Sodium 132 (*)    CO2 15 (*)    Glucose, Bld 151 (*)    BUN 50 (*)    Creatinine, Ser 6.34 (*)    Calcium 8.3 (*)    Albumin 3.0 (*)    GFR calc non Af Amer 9 (*)    GFR calc Af Amer 10 (*)    All other components within normal limits  CBC - Abnormal; Notable for the following:    WBC 12.2 (*)    RBC 3.23 (*)    Hemoglobin 9.8 (*)    HCT 29.6 (*)    RDW 16.1 (*)    Platelets 147 (*)    All other components within normal limits  URINALYSIS, ROUTINE W REFLEX MICROSCOPIC (NOT AT [facility]) - Abnormal; Notable for the following:    APPearance TURBID (*)    Hgb urine dipstick MODERATE (*)    Protein, ur 100 (*)    Leukocytes,  UA LARGE (*)    All other components within normal limits  MAGNESIUM - Abnormal; Notable for the following:    Magnesium 1.3 (*)    All other components within normal limits  CBC WITH DIFFERENTIAL/PLATELET - Abnormal; Notable for the following:    WBC 12.4 (*)    RBC 3.36 (*)    Hemoglobin 10.2 (*)    HCT 30.8 (*)    RDW 15.9 (*)    Neutro Abs 9.9 (*)    Lymphs Abs 0.5 (*)    Monocytes Absolute 2.0 (*)    All other components within normal limits  URINE MICROSCOPIC-ADD ON - Abnormal; Notable for the following:    Squamous Epithelial / LPF 0-5 (*)  Bacteria, UA MANY (*)    Casts GRANULAR CAST (*)    All other components within normal limits  C DIFFICILE QUICK SCREEN W PCR REFLEX  CULTURE, BLOOD (ROUTINE X 2)  CULTURE, BLOOD (ROUTINE X 2)  URINE CULTURE  GASTROINTESTINAL PANEL BY PCR, STOOL (REPLACES STOOL CULTURE)  LIPASE, BLOOD  I-STAT CG4 LACTIC ACID, ED  I-STAT CG4 LACTIC ACID, ED  GC/CHLAMYDIA PROBE AMP ([place]) NOT AT [facility]    Imaging Review No results found. I have personally reviewed and evaluated these images and lab results as part of my medical decision-making.   EKG Interpretation None      MDM   Final diagnoses:  Nausea vomiting and diarrhea  Acute cystitis with hematuria  Acute-on-chronic kidney injury ([place])    Filed Vitals:   11/12/15 1330 11/12/15 1345 11/12/15 1400 11/12/15 1415  BP: 142/74 137/71 128/72 116/80  Pulse: 84 81 80 79  Temp:      TempSrc:      Resp: _0 SpO2: 96% 96% 98% 97%    Medications  ondansetron (ZOFRAN) injection 4 mg (4 mg Intravenous Given 11/12/15 0911)  sodium chloride 0.9 % bolus 1,000 mL (0 mLs Intravenous Stopped 11/12/15 1106)  famotidine (PEPCID) IVPB 20 mg premix (0 mg Intravenous Stopped 11/12/15 1106)  cefTRIAXone (ROCEPHIN) 1 g in dextrose 5 % 50 mL IVPB (0 g Intravenous Stopped 11/12/15 1303)    [name] is 55 y.o. male with past medical history significant for multiple myeloma,  actively undergoing chemotherapy, has not required prior nausea, vomiting diarrhea worsening over the course of 2 days (that this contradicts triage note which said 1 week however that is not accurate) patient with low-grade oral temperature 1 in the ED and he reports subjective fever at home. She has normal white count with no neutropenia. Abdominal exam is benign, patient is not to Her tachycardic, he has a mild leukocytosis of 12.4, normal lactic acid level.  Urinalysis appears infected, he has had urinary frequency and an episode of dysuria about a week ago, negative CVA tenderness to percussion, urine culture pending, patient will be given Rocephin and sent home with Keflex.  Patient has past oral challenge, he understands that he will need to stay very hydrated to protect his creatinine level, he will have a repeat creatinine level at primary care next week.  Evaluation does not show pathology that would require ongoing emergent intervention or inpatient treatment. Pt is hemodynamically stable and mentating appropriately. Discussed findings and plan with patient/guardian, who agrees with care plan. All questions answered. Return precautions discussed and outpatient follow up given.   New Prescriptions   CEPHALEXIN (KEFLEX) 500 MG CAPSULE    Take 1 capsule (500 mg total) by mouth 2 (two) times daily.   MAGNESIUM OXIDE (MAG-OX) 400 MG TABLET    Take 1 tablet (400 mg total) by mouth 2 (two) times daily.   ONDANSETRON (ZOFRAN ODT) 4 MG DISINTEGRATING TABLET    Take 1 tablet (4 mg total) by mouth every 8 (eight) hours as needed for nausea or vomiting.   PROMETHAZINE (PHENERGAN) 25 MG TABLET    Take 1 tablet (25 mg total) by mouth every 6 (six) hours as needed for nausea or vomiting.         Monico Blitz, PA-C 11/12/15 1500  Virgel Manifold, MD 11/19/15 2206

## 2015-11-12 NOTE — ED Notes (Signed)
Patient tolerated ginger ale that staff provided him. Pt denies any vomiting after drinking ginger ale.

## 2015-11-12 NOTE — ED Notes (Signed)
Pt arrives via ptar for c/o n/v x 1 week, ptar reports pt was positive for orthostatics with them. Pt also reports he is out of some of his meds.

## 2015-11-12 NOTE — ED Notes (Signed)
Patient refusing rectal temp at this time.

## 2015-11-12 NOTE — Discharge Instructions (Signed)
Push fluids: take small frequent sips of water or Gatorade, do not drink any soda, juice or caffeinated beverages.    Slowly resume solid diet as desired. Avoid food that are spicy, contain dairy and/or have high fat content.  Aviod NSAIDs (aspirin, motrin, ibuprofen, naproxen, Aleve et Ronney Asters) for pain control because they will irritate your stomach.  Your kidney function today is not normal, he will need to have this rechecked by her primary care doctor in the next week.  If you develop any new or worsening symptoms do not hesitate to return to the emergency room.

## 2015-11-14 LAB — URINE CULTURE

## 2015-11-15 ENCOUNTER — Telehealth (HOSPITAL_BASED_OUTPATIENT_CLINIC_OR_DEPARTMENT_OTHER): Payer: Self-pay | Admitting: Emergency Medicine

## 2015-11-15 LAB — GC/CHLAMYDIA PROBE AMP (~~LOC~~) NOT AT ARMC
Chlamydia: NEGATIVE
Neisseria Gonorrhea: NEGATIVE

## 2015-11-15 NOTE — Telephone Encounter (Signed)
Post ED Visit - Positive Culture Follow-up  Culture report reviewed by antimicrobial stewardship pharmacist:  []  Elenor Quinones, Pharm.D. []  Heide Guile, Pharm.D., BCPS []  Parks Neptune, Pharm.D. []  Alycia Rossetti, Pharm.D., BCPS []  [place], [place].D., BCPS, AAHIVP []  Legrand Como, Pharm.D., BCPS, AAHIVP [x]  Milus Glazier, Pharm.D. []  [place]s [place], [place].D.  Positive urine culture Treated with cephalexin, organism sensitive to the same and no further patient follow-up is required at this time.  Hazle Nordmann 11/15/2015, 10:47 AM

## 2015-11-17 LAB — CULTURE, BLOOD (ROUTINE X 2)
Culture: NO GROWTH
Culture: NO GROWTH

## 2016-01-09 ENCOUNTER — Emergency Department (HOSPITAL_COMMUNITY): Payer: Medicaid Other

## 2016-01-09 ENCOUNTER — Inpatient Hospital Stay (HOSPITAL_COMMUNITY)
Admission: EM | Admit: 2016-01-09 | Discharge: 2016-01-18 | DRG: 871 | Disposition: A | Discharge status: Home / Self Care | Payer: Medicaid Other | Attending: Internal Medicine | Admitting: Internal Medicine

## 2016-01-09 ENCOUNTER — Encounter (HOSPITAL_COMMUNITY): Payer: Self-pay | Admitting: Emergency Medicine

## 2016-01-09 DIAGNOSIS — R509 Fever, unspecified: Secondary | ICD-10-CM | POA: Diagnosis not present

## 2016-01-09 DIAGNOSIS — A419 Sepsis, unspecified organism: Secondary | ICD-10-CM | POA: Diagnosis present

## 2016-01-09 DIAGNOSIS — N2581 Secondary hyperparathyroidism of renal origin: Secondary | ICD-10-CM | POA: Diagnosis present

## 2016-01-09 DIAGNOSIS — A4159 Other Gram-negative sepsis: Secondary | ICD-10-CM | POA: Diagnosis present

## 2016-01-09 DIAGNOSIS — E875 Hyperkalemia: Secondary | ICD-10-CM | POA: Diagnosis not present

## 2016-01-09 DIAGNOSIS — I10 Essential (primary) hypertension: Secondary | ICD-10-CM | POA: Diagnosis not present

## 2016-01-09 DIAGNOSIS — K219 Gastro-esophageal reflux disease without esophagitis: Secondary | ICD-10-CM | POA: Diagnosis present

## 2016-01-09 DIAGNOSIS — Z9049 Acquired absence of other specified parts of digestive tract: Secondary | ICD-10-CM

## 2016-01-09 DIAGNOSIS — E860 Dehydration: Secondary | ICD-10-CM | POA: Diagnosis present

## 2016-01-09 DIAGNOSIS — I1311 Hypertensive heart and chronic kidney disease without heart failure, with stage 5 chronic kidney disease, or end stage renal disease: Secondary | ICD-10-CM | POA: Diagnosis present

## 2016-01-09 DIAGNOSIS — N185 Chronic kidney disease, stage 5: Secondary | ICD-10-CM | POA: Diagnosis present

## 2016-01-09 DIAGNOSIS — N1330 Unspecified hydronephrosis: Secondary | ICD-10-CM | POA: Diagnosis not present

## 2016-01-09 DIAGNOSIS — K567 Ileus, unspecified: Secondary | ICD-10-CM | POA: Diagnosis present

## 2016-01-09 DIAGNOSIS — N170 Acute kidney failure with tubular necrosis: Secondary | ICD-10-CM | POA: Diagnosis present

## 2016-01-09 DIAGNOSIS — N10 Acute tubulo-interstitial nephritis: Secondary | ICD-10-CM | POA: Diagnosis present

## 2016-01-09 DIAGNOSIS — N136 Pyonephrosis: Secondary | ICD-10-CM | POA: Diagnosis present

## 2016-01-09 DIAGNOSIS — E1122 Type 2 diabetes mellitus with diabetic chronic kidney disease: Secondary | ICD-10-CM | POA: Diagnosis present

## 2016-01-09 DIAGNOSIS — B961 Klebsiella pneumoniae [K. pneumoniae] as the cause of diseases classified elsewhere: Secondary | ICD-10-CM | POA: Diagnosis present

## 2016-01-09 DIAGNOSIS — I517 Cardiomegaly: Secondary | ICD-10-CM | POA: Diagnosis present

## 2016-01-09 DIAGNOSIS — F329 Major depressive disorder, single episode, unspecified: Secondary | ICD-10-CM | POA: Diagnosis present

## 2016-01-09 DIAGNOSIS — C9000 Multiple myeloma not having achieved remission: Secondary | ICD-10-CM | POA: Diagnosis present

## 2016-01-09 DIAGNOSIS — R112 Nausea with vomiting, unspecified: Secondary | ICD-10-CM | POA: Diagnosis present

## 2016-01-09 DIAGNOSIS — K5669 Other intestinal obstruction: Secondary | ICD-10-CM

## 2016-01-09 DIAGNOSIS — E119 Type 2 diabetes mellitus without complications: Secondary | ICD-10-CM

## 2016-01-09 DIAGNOSIS — D703 Neutropenia due to infection: Secondary | ICD-10-CM | POA: Diagnosis present

## 2016-01-09 DIAGNOSIS — Z79899 Other long term (current) drug therapy: Secondary | ICD-10-CM

## 2016-01-09 DIAGNOSIS — K56600 Partial intestinal obstruction, unspecified as to cause: Secondary | ICD-10-CM | POA: Diagnosis present

## 2016-01-09 DIAGNOSIS — M109 Gout, unspecified: Secondary | ICD-10-CM | POA: Diagnosis present

## 2016-01-09 DIAGNOSIS — K56609 Unspecified intestinal obstruction, unspecified as to partial versus complete obstruction: Secondary | ICD-10-CM

## 2016-01-09 DIAGNOSIS — N189 Chronic kidney disease, unspecified: Secondary | ICD-10-CM

## 2016-01-09 DIAGNOSIS — N12 Tubulo-interstitial nephritis, not specified as acute or chronic: Secondary | ICD-10-CM | POA: Diagnosis not present

## 2016-01-09 DIAGNOSIS — Z7982 Long term (current) use of aspirin: Secondary | ICD-10-CM

## 2016-01-09 DIAGNOSIS — R140 Abdominal distension (gaseous): Secondary | ICD-10-CM

## 2016-01-09 DIAGNOSIS — D631 Anemia in chronic kidney disease: Secondary | ICD-10-CM | POA: Diagnosis present

## 2016-01-09 DIAGNOSIS — E872 Acidosis: Secondary | ICD-10-CM | POA: Diagnosis present

## 2016-01-09 DIAGNOSIS — R5081 Fever presenting with conditions classified elsewhere: Secondary | ICD-10-CM | POA: Diagnosis present

## 2016-01-09 DIAGNOSIS — Z6836 Body mass index (BMI) 36.0-36.9, adult: Secondary | ICD-10-CM

## 2016-01-09 DIAGNOSIS — E669 Obesity, unspecified: Secondary | ICD-10-CM | POA: Diagnosis present

## 2016-01-09 LAB — COMPREHENSIVE METABOLIC PANEL
ALBUMIN: 3.4 g/dL — AB (ref 3.5–5.0)
ALK PHOS: 91 U/L (ref 38–126)
ALT: 23 U/L (ref 17–63)
AST: 23 U/L (ref 15–41)
Anion gap: 8 (ref 5–15)
BILIRUBIN TOTAL: 0.7 mg/dL (ref 0.3–1.2)
BUN: 37 mg/dL — AB (ref 6–20)
CALCIUM: 8.7 mg/dL — AB (ref 8.9–10.3)
CO2: 26 mmol/L (ref 22–32)
Chloride: 103 mmol/L (ref 101–111)
Creatinine, Ser: 5.62 mg/dL — ABNORMAL HIGH (ref 0.61–1.24)
GFR calc Af Amer: 12 mL/min — ABNORMAL LOW (ref >60)
GFR calc non Af Amer: 10 mL/min — ABNORMAL LOW (ref >60)
GLUCOSE: 100 mg/dL — AB (ref 65–99)
Potassium: 5.6 mmol/L — ABNORMAL HIGH (ref 3.5–5.1)
Sodium: 137 mmol/L (ref 135–145)
TOTAL PROTEIN: 7.7 g/dL (ref 6.5–8.1)

## 2016-01-09 LAB — URINALYSIS, ROUTINE W REFLEX MICROSCOPIC
BILIRUBIN URINE: NEGATIVE
Glucose, UA: NEGATIVE mg/dL
KETONES UR: NEGATIVE mg/dL
NITRITE: NEGATIVE
PH: 6.5 (ref 5.0–8.0)
Protein, ur: 100 mg/dL — AB
Specific Gravity, Urine: 1.014 (ref 1.005–1.030)

## 2016-01-09 LAB — CBC
HCT: 32.5 % — ABNORMAL LOW (ref 39.0–52.0)
Hemoglobin: 10.7 g/dL — ABNORMAL LOW (ref 13.0–17.0)
MCH: 32.5 pg (ref 26.0–34.0)
MCHC: 32.9 g/dL (ref 30.0–36.0)
MCV: 98.8 fL (ref 78.0–100.0)
Platelets: 226 10*3/uL (ref 150–400)
RBC: 3.29 MIL/uL — ABNORMAL LOW (ref 4.22–5.81)
RDW: 17 % — AB (ref 11.5–15.5)
WBC: 2.2 10*3/uL — ABNORMAL LOW (ref 4.0–10.5)

## 2016-01-09 LAB — URINE MICROSCOPIC-ADD ON: SQUAMOUS EPITHELIAL / LPF: NONE SEEN

## 2016-01-09 LAB — I-STAT CG4 LACTIC ACID, ED
LACTIC ACID, VENOUS: 2.66 mmol/L — AB (ref 0.5–1.9)
Lactic Acid, Venous: 2.74 mmol/L (ref 0.5–1.9)

## 2016-01-09 LAB — LIPASE, BLOOD: Lipase: 53 U/L — ABNORMAL HIGH (ref 11–51)

## 2016-01-09 MED ORDER — INSULIN ASPART 100 UNIT/ML IV SOLN
5.0000 [IU] | Freq: Once | INTRAVENOUS | Status: AC
  Administered 2016-01-10: 5 [IU] via INTRAVENOUS
  Filled 2016-01-09: qty 0.05

## 2016-01-09 MED ORDER — VANCOMYCIN HCL 10 G IV SOLR
1500.0000 mg | Freq: Once | INTRAVENOUS | Status: AC
  Administered 2016-01-09: 1500 mg via INTRAVENOUS
  Filled 2016-01-09: qty 1500

## 2016-01-09 MED ORDER — ONDANSETRON HCL 4 MG/2ML IJ SOLN
4.0000 mg | Freq: Once | INTRAMUSCULAR | Status: AC | PRN
  Administered 2016-01-09: 4 mg via INTRAVENOUS
  Filled 2016-01-09: qty 2

## 2016-01-09 MED ORDER — ACETAMINOPHEN 650 MG RE SUPP
650.0000 mg | Freq: Once | RECTAL | Status: AC
  Administered 2016-01-09: 650 mg via RECTAL
  Filled 2016-01-09: qty 1

## 2016-01-09 MED ORDER — DEXTROSE 50 % IV SOLN
50.0000 mL | Freq: Once | INTRAVENOUS | Status: AC
  Administered 2016-01-10: 50 mL via INTRAVENOUS
  Filled 2016-01-09: qty 50

## 2016-01-09 MED ORDER — SODIUM CHLORIDE 0.9 % IV BOLUS (SEPSIS)
1000.0000 mL | Freq: Once | INTRAVENOUS | Status: AC
  Administered 2016-01-09: 1000 mL via INTRAVENOUS

## 2016-01-09 MED ORDER — HYDROMORPHONE HCL 1 MG/ML IJ SOLN
1.0000 mg | Freq: Once | INTRAMUSCULAR | Status: AC
  Administered 2016-01-09: 1 mg via INTRAVENOUS
  Filled 2016-01-09: qty 1

## 2016-01-09 MED ORDER — SODIUM CHLORIDE 0.9 % IV SOLN
1.0000 g | Freq: Once | INTRAVENOUS | Status: AC
  Administered 2016-01-10: 1 g via INTRAVENOUS
  Filled 2016-01-09: qty 10

## 2016-01-09 MED ORDER — SODIUM POLYSTYRENE SULFONATE 15 GM/60ML PO SUSP
30.0000 g | Freq: Once | ORAL | Status: AC
  Administered 2016-01-10: 30 g via RECTAL
  Filled 2016-01-09: qty 120

## 2016-01-09 MED ORDER — SODIUM CHLORIDE 0.9 % IV BOLUS (SEPSIS)
500.0000 mL | Freq: Once | INTRAVENOUS | Status: AC
  Administered 2016-01-09: 500 mL via INTRAVENOUS

## 2016-01-09 MED ORDER — PIPERACILLIN-TAZOBACTAM 3.375 G IVPB
3.3750 g | Freq: Once | INTRAVENOUS | Status: AC
  Administered 2016-01-09: 3.375 g via INTRAVENOUS
  Filled 2016-01-09: qty 50

## 2016-01-09 NOTE — ED Notes (Signed)
Pt walked to and from bathroom. 

## 2016-01-09 NOTE — ED Notes (Signed)
Dr.Little notified of temp and Radiology staff made aware of urgency for CT scan.

## 2016-01-09 NOTE — ED Triage Notes (Signed)
Patient has multiple myeloma, is currently on day day 2 of a 21 day treatment regimen.  

## 2016-01-09 NOTE — H&P (Addendum)
History and Physical    [name] BTD:[id number] DOB: Feb 12, 1961 DOA: 01/09/2016  Referring MD/NP/PA:   PCP: Pcp Not In System   Patient coming from:  The patient is coming from home.  At baseline, pt is independent for most of ADL.   Chief Complaint: Nausea, vomiting, abdominal pain, increased urinary frequency  HPI: [name] is a 55 y.o. male with medical history significant of multiple myeloma (on Revlimid), hypertension, GERD, gout, depression, CKD-V,  S/p of cholecystectomy last week, who presents with nausea, vomiting, abdominal pain and increased urinary frequency.  Patient states that he had cholecystectomy in [facility] last week. He has been doing fine until this morning when he started having nausea, vomiting, abdominal pain. He vomited twice without blood in vomitus. No diarrhea. Last bowel movement was in this afternoon. His abdominal pain is located in the midline of abdomen, constant, 3-4 out of 10 in severity, dull, nonradiating. Patient does not have subjective fever and chills, but his temperature is 102.7 on admission. He also reports increased urinary frequency, but no dysuria or burning on urination. He does not have chest pain, cough, shortness breath, unilateral weakness.  EDP tried to transfer pt back to [facility]. Dr. Rex Kras spoke with on call surgeon (Dr.Himenez), who agreed to accept pt. When Dr. Rex Kras talked to on on-call hospitalist, Dr. Tresa Res, she was told that due to lack of nursing staffing, [facility] does not accept any outside transfer tonight. I also called Dr. Tresa Res again, and was told the same. Dr. Theron Arista provided me with their pager numer [phone] call back in AM to see if we can transfer pt tomorrow.  ED Course: pt was found to have WBC 2.2, lactate of 2.66-->2.74-->3.3, positive urinalysis for UTI, potassium 5.6 with T-wave peaking in V2, creatinine 5.62 which was a 6.34 on  11/10/15, BUN 37, temperature 102.7, no tachycardia, has tachypnea, AND saturation 94% on room air. Pt is admitted to stepdown bed as inpatient for further intervention treatment.  # CT abdomen/pelvis showed: 1. Right hydronephrosis with perinephric and proximal periureteral inflammatory stranding, but no obstructing etiology identified. Mild  to moderate left perinephric stranding without hydronephrosis. These findings are nonspecific, and an infected obstructed right kidney is difficult to exclude in this setting. Query abnormal urinalysis. 2. Surgically absent gallbladder with minimal postoperative changes in the gallbladder fossa and no adverse features. 3. Partial or early small bowel obstruction, but without abrupt transition point. There is a gradual transition to normal small bowel loops in the left lower quadrant. 4. Cardiomegaly, new since 2010. 5. Chronic expansile lytic lesions of the left pelvis and pubic rami compatible with stated history of multiple myeloma.  Review of Systems:   General: has fevers, no chills, no changes in body weight, has poor appetite, has fatigue HEENT: no blurry vision, hearing changes or sore throat Pulm: no dyspnea, coughing, wheezing CV: no chest pain, no palpitations Abd: has nausea, vomiting, abdominal pain, no diarrhea, constipation GU: no dysuria, burning on urination, has ncreased urinary frequency, no hematuria  Ext: no leg edema Neuro: no unilateral weakness, numbness, or tingling, no vision change or hearing loss Skin: no rash MSK: No muscle spasm, no deformity, no limitation of range of movement in spin Heme: No easy bruising.  Travel history: No recent long distant travel.  Allergy:  Allergies  Allergen Reactions  . Tape Rash    Paper    Past Medical History:  Diagnosis Date  . Diabetes mellitus  without complication ([place])   . Hypertension   . Multiple myeloma ([place])   . Renal disorder     Past Surgical History:  Procedure  Laterality Date  . CHOLECYSTECTOMY    . DIALYSIS FISTULA CREATION    . knee Left     Social History:  reports that he has never smoked. He has never used smokeless tobacco. He reports that he does not drink alcohol or use drugs.  Family History:  Family History  Problem Relation Age of Onset  . Hypertension Mother   . Hypertension Father   . Multiple myeloma Brother      Prior to Admission medications   Medication Sig Start Date End Date Taking? Authorizing Provider  amLODipine (NORVASC) 2.5 MG tablet Take 2.5 mg by mouth daily. 05/11/15  Yes Historical Provider, MD  aspirin (GOODSENSE ASPIRIN) 325 MG tablet Take 325 mg by mouth at bedtime.    Yes Historical Provider, MD  B Complex-C-Folic Acid (NEPHRO-VITE PO) Take 1 tablet by mouth daily.   Yes Historical Provider, MD  calcitRIOL (ROCALTROL) 0.5 MCG capsule Take 0.5 mcg by mouth as directed. Take 0.45mg daily except on Saturday and Sunday take 0.526m twice daily   Yes Historical Provider, MD  cetirizine (ZYRTEC) 10 MG tablet Take 10 mg by mouth daily.   Yes Historical Provider, MD  Co-Enzyme Q-10 30 MG CAPS Take 30 mg by mouth daily.   Yes Historical Provider, MD  doxazosin (CARDURA) 4 MG tablet Take 4 mg by mouth at bedtime.   Yes Historical Provider, MD  escitalopram (LEXAPRO) 10 MG tablet Take 10 mg by mouth at bedtime.    Yes Historical Provider, MD  febuxostat (ULORIC) 40 MG tablet Take 40 mg by mouth daily.   Yes Historical Provider, MD  fluticasone (FLONASE) 50 MCG/ACT nasal spray Place 1 spray into the nose daily as needed for allergies.    Yes Historical Provider, MD  HYDROcodone-acetaminophen (NORCO/VICODIN) 5-325 MG tablet Take 1-2 tablets by mouth every 4 (four) hours as needed for pain. 12/31/15  Yes Historical Provider, MD  lenalidomide (REVLIMID) 5 MG capsule Take 5 mg by mouth at bedtime. On for x21 days then off for 7 days 11/08/15  Yes Historical Provider, MD  magnesium oxide (MAG-OX) 400 MG tablet Take 1 tablet (400 mg  total) by mouth 2 (two) times daily. 11/12/15  Yes Nicole Pisciotta, PA-C  omeprazole (PRILOSEC) 20 MG capsule Take 20 mg by mouth daily.   Yes Historical Provider, MD  ondansetron (ZOFRAN ODT) 4 MG disintegrating tablet Take 1 tablet (4 mg total) by mouth every 8 (eight) hours as needed for nausea or vomiting. 11/12/15  Yes Nicole Pisciotta, PA-C  promethazine (PHENERGAN) 25 MG tablet Take 1 tablet (25 mg total) by mouth every 6 (six) hours as needed for nausea or vomiting. 11/12/15  Yes Nicole Pisciotta, PA-C  senna (SENOKOT) 8.6 MG tablet Take 1 tablet by mouth every evening.    Yes Historical Provider, MD  sodium bicarbonate 650 MG tablet Take 2 tablets (1,300 mg total) by mouth 4 (four) times daily. 11/12/15  Yes Nicole Pisciotta, PA-C  cephALEXin (KEFLEX) 500 MG capsule Take 1 capsule (500 mg total) by mouth 2 (two) times daily. Patient not taking: Reported on 01/09/2016 11/12/15   NiMonico BlitzPA-C    Physical Exam: Vitals:   01/09/16 2227 01/09/16 2230 01/09/16 2300 01/09/16 2330  BP: 122/66 112/71 116/74 120/73  Pulse: 78 80 78 78  Resp: 25 23 23 26   Temp: 100.2 F (  37.9 C)     TempSrc: Oral     SpO2: 95% 95% 96% 99%  Weight:      Height:       General: Not in acute distress HEENT:       Eyes: PERRL, EOMI, no scleral icterus.       ENT: No discharge from the ears and nose, no pharynx injection, no tonsillar enlargement.        Neck: No JVD, no bruit, no mass felt. Heme: No neck lymph node enlargement. Cardiac: S1/S2, RRR, No murmurs, No gallops or rubs. Pulm: No rales, wheezing, rhonchi or rubs. Abd: distended, diffused tenderness, no rebound pain, no organomegaly, BS present. GU: No hematuria Ext: No pitting leg edema bilaterally. 2+DP/PT pulse bilaterally. Musculoskeletal: No joint deformities, No joint redness or warmth, no limitation of ROM in spin. Skin: No rashes.  Neuro: Alert, oriented X3, cranial nerves II-XII grossly intact, moves all extremities  normally. Psych: Patient is not psychotic, no suicidal or hemocidal ideation.  Labs on Admission: I have personally reviewed following labs and imaging studies  CBC:  Recent Labs Lab 01/09/16 1654  WBC 2.2*  HGB 10.7*  HCT 32.5*  MCV 98.8  PLT 902   Basic Metabolic Panel:  Recent Labs Lab 01/09/16 1654  NA 137  K 5.6*  CL 103  CO2 26  GLUCOSE 100*  BUN 37*  CREATININE 5.62*  CALCIUM 8.7*   GFR: Estimated Creatinine Clearance: 20.4 mL/min (by C-G formula based on SCr of 5.62 mg/dL). Liver Function Tests:  Recent Labs Lab 01/09/16 1654  AST 23  ALT 23  ALKPHOS 91  BILITOT 0.7  PROT 7.7  ALBUMIN 3.4*    Recent Labs Lab 01/09/16 1654  LIPASE 53*   No results for input(s): AMMONIA in the last 168 hours. Coagulation Profile: No results for input(s): INR, PROTIME in the last 168 hours. Cardiac Enzymes: No results for input(s): CKTOTAL, CKMB, CKMBINDEX, TROPONINI in the last 168 hours. BNP (last 3 results) No results for input(s): PROBNP in the last 8760 hours. HbA1C: No results for input(s): HGBA1C in the last 72 hours. CBG: No results for input(s): GLUCAP in the last 168 hours. Lipid Profile: No results for input(s): CHOL, HDL, LDLCALC, TRIG, CHOLHDL, LDLDIRECT in the last 72 hours. Thyroid Function Tests: No results for input(s): TSH, T4TOTAL, FREET4, T3FREE, THYROIDAB in the last 72 hours. Anemia Panel: No results for input(s): VITAMINB12, FOLATE, FERRITIN, TIBC, IRON, RETICCTPCT in the last 72 hours. Urine analysis:    Component Value Date/Time   COLORURINE YELLOW 01/09/2016 1559   APPEARANCEUR CLOUDY (A) 01/09/2016 1559   LABSPEC 1.014 01/09/2016 1559   PHURINE 6.5 01/09/2016 1559   GLUCOSEU NEGATIVE 01/09/2016 1559   HGBUR TRACE (A) 01/09/2016 1559   BILIRUBINUR NEGATIVE 01/09/2016 1559   KETONESUR NEGATIVE 01/09/2016 1559   PROTEINUR 100 (A) 01/09/2016 1559   UROBILINOGEN 0.2 06/24/2007 2040   NITRITE NEGATIVE 01/09/2016 1559    LEUKOCYTESUR MODERATE (A) 01/09/2016 1559   Sepsis Labs: '@LABRCNTIP'$ (procalcitonin:4,lacticidven:4) ) Recent Results (from the past 240 hour(s))  Culture, blood (routine x 2)     Status: None (Preliminary result)   Collection Time: 01/09/16  7:05 PM  Result Value Ref Range Status   Specimen Description BLOOD RIGHT ANTECUBITAL  Final   Special Requests BOTTLES DRAWN AEROBIC AND ANAEROBIC 5CC  Final   Culture PENDING  Incomplete   Report Status PENDING  Incomplete  Culture, blood (routine x 2)     Status: None (Preliminary result)  Collection Time: 01/09/16  7:06 PM  Result Value Ref Range Status   Specimen Description BLOOD RIGHT HAND  Final   Special Requests IN PEDIATRIC BOTTLE 2CC  Final   Culture PENDING  Incomplete   Report Status PENDING  Incomplete     Radiological Exams on Admission: Ct Abdomen Pelvis Wo Contrast  Result Date: 01/09/2016 CLINICAL DATA:  55 year old male with right side abdominal pain after recent cholecystectomy. Fever. Initial encounter. Stage IV multiple myeloma. EXAM: CT ABDOMEN AND PELVIS WITHOUT CONTRAST TECHNIQUE: Multidetector CT imaging of the abdomen and pelvis was performed following the standard protocol without IV contrast. COMPARISON:  [facility] CT Abdomen and Pelvis 10/05/2008. FINDINGS: Respiratory motion artifact at the lung bases which appear clear. Cardiomegaly is new since 2010. No pericardial or pleural effusion. The visible spine appears intact. Chronic severe expansile bony lesions about the left acetabulum and affecting the bilateral pubic rami. These appears somewhat more healed/ sclerotic than on the 2010 exam. No new osseous lesion identified. No pelvic free fluid. Fairly decompressed rectum. Redundant sigmoid colon tracking into the right lower quadrant and mildly gas distended there, but otherwise negative. Mild diverticulosis of the left colon which is otherwise negative aside from retained stool. Mild gas and stool  distension of the transverse colon. Fluid in the right colon. Negative retrocecal appendix. Negative terminal ileum. Multiple mildly dilated contrast containing proximal small bowel loops up to 3 cm diameter. There is however relatively gradual transition to decompressed distal small bowel loops which occurs in the left lower quadrant (series 2, image 66). No abdominal free air or free fluid. Stomach mildly distended with contrast. Negative duodenum. Gallbladder is now surgically absent. Surgical clips in the gallbladder fossa plus a small volume of intermediate density material. No biliary ductal enlargement identified. Negative noncontrast liver otherwise. Negative noncontrast spleen, pancreas and adrenal glands. However, both kidneys appear abnormal. There is right moderate to severe hydronephrosis with right para renal stranding. The proximal right ureter also appears enlarged and indistinct with periureteral stranding. However, the right ureter quickly tapers and no obstructing etiology is identified. There is mild to moderate left para renal stranding without hydronephrosis or hydroureter. No abnormality of the left ureter. No lymphadenopathy. IMPRESSION: 1. Right hydronephrosis with perinephric and proximal periureteral inflammatory stranding, but no obstructing etiology identified. Mild to moderate left perinephric stranding without hydronephrosis. These findings are nonspecific, and an infected obstructed right kidney is difficult to exclude in this setting. Query abnormal urinalysis. 2. Surgically absent gallbladder with minimal postoperative changes in the gallbladder fossa and no adverse features. 3. Partial or early small bowel obstruction, but without abrupt transition point. There is a gradual transition to normal small bowel loops in the left lower quadrant. 4. Cardiomegaly, new since 2010. 5. Chronic expansile lytic lesions of the left pelvis and pubic rami compatible with stated history of multiple  myeloma. Electronically Signed   By: Genevie Ann M.D.   On: 01/09/2016 20:54     EKG: Independently reviewed. Sinus rhythm, QTC 429, Q wave inversion only in lead 3, T-wave peaking in V2  Assessment/Plan Principal Problem:   Pyelonephritis Active Problems:   Sepsis ([place])   Multiple myeloma ([place])   Hypertension   Diabetes mellitus without complication ([place])   CKD (chronic kidney disease), stage V (H[facility])   Hyperkalemia   Partial small bowel obstruction ([facility])   Hydronephrosis of right kidney   Pyelonephritis and sepsis: Patient has positive UA, and right-sided hydronephrosis, consistent with pyelonephritis. Patient is septic  with neutropenia, fever, elevated lactate. Currently hemodynamically stable. Regarding right hydronephrosis as showed by CT scan, DEP consulted urology, Dr.McKenzie, who looked at CT and felt that findings were related to pyelo and not a stone. He recommended usual pyelo treatment.   - Admit to SDU as inpt - will start Abx with broad coverage given immunosuppressed status and neutropenia: IV vancomycin and Zosyn - Follow up results of urine and blood cx and amend antibiotic regimen if needed per sensitivity results - will get Procalcitonin and trend lactic acid levels per sepsis protocol. - IVF: total of 3L of NS bolus, followed by 100 cc/h (patient has CKD-V, limiting aggressive IV fluids treatment).  Hydronephrosis of right kidney: -see above  Hyperkalemia: Potassium 5.6 with mild T-wave peaking in V2 -treated with D50, 5 unit of Novolog, 1g of calcium chloride, 30 g of Kayexalate per rectal -f/u by BMP  Addendum: the repeated K is 6.0 in AM.  -will treat again with with D50, 5 unit of Novolog, 1g of calcium chloride, 45 g of Kayexalate per rectal  Partial small bowel obstruction: EDP consulted general surgeon, Dr. Zella Richer, who reviewed films and feels that pt likely has an ileus related to the infection rather than an obstruction. He has recommended holding on  NG tube for now and contacting general surgery tomorrow if the patient is not transferred. -When necessary Zofran for nausea -When necessary Dilaudid for pain -INF as above  Multiple myeloma ([place]): Patient was diagnosed on 2008. Has been treated and followed up by Dr. Cruzita Lederer in [facility] Currently he is getting Revlimid (2nd day of 21 day cycle). WBC 2.2, hemoglobin 10.7. -Continue revlimid -f/u with Dr. Cruzita Lederer  HTN: Blood pressure 122/66 -Continue amlodipine - pt is also on Doxazosin -IV hydralazine when necessary  Diet Diabetes mellitus without complication ([place]): No X2O on record. CBG 100 on admission -Check A1c -CBG every morning  CKD (chronic kidney disease), stage V [facility]): Patient states that he had ESRD and was on dialysis from a 2009-2012. Due to improved renal function, he stopped dialysis. Recent creatinine was 6.34 on 11/10/15. Today his creatinine is 5.62, BUN 37, potassium 5.6, bicarbonate 26. No urgent need for dialysis. -on IVF as above -f/u renal fx by BMP   DVT ppx: SCD Code Status: Full code Family Communication: None at bed side. Disposition Plan:  Anticipate discharge back to previous home environment Consults called:  Urology, Dr. Alyson Ingles; general surgeon, Dr. Cassie Freer Admission status: SDU/inpation       Date of Service 01/10/2016    Ivor Costa [facility] Pager [phone]  If 7PM-7AM, please contact night-coverage [email or web address] Password [facility] 01/10/2016, 12:58 AM

## 2016-01-09 NOTE — Progress Notes (Signed)
Pharmacy Antibiotic Note  [name] is a 55 y.o. male with stage IV multiple myeloma being treated with Revlimid and CKD stage IV (not on HD), s/p gallbladder removal last week,  presented to the ED on 8/13 with c/o chills and vomiting.  To start vancomycin for suspected sepsis.  - Tmax 101.3, wbc low; CKD, scr 5.62 (crcl~15 N)   Plan: - vancomycin 1500 mg IV x1.  Will check level in 2-3 days and re-dose if level is <20  ________________________  Height: 6' 1"  (185.4 cm) Weight: 272 lb (123.4 kg) IBW/kg (Calculated) : 79.9  Temp (24hrs), Avg:100.5 F (38.1 C), Min:99.7 F (37.6 C), Max:101.3 F (38.5 C)   Recent Labs Lab 01/09/16 1654  WBC 2.2*  CREATININE 5.62*    Estimated Creatinine Clearance: 20.4 mL/min (by C-G formula based on SCr of 5.62 mg/dL).    Allergies  Allergen Reactions  . Tape Rash    Paper    Thank you for allowing pharmacy to be a part of this patient's care.  Lynelle Doctor 01/09/2016 6:34 PM

## 2016-01-09 NOTE — ED Notes (Signed)
RN starting IV, drawing labs 

## 2016-01-09 NOTE — ED Triage Notes (Signed)
Patient states he had his gallbladder removed last week, d/c home on Monday. Patient states this morning he woke with pain at his surgical site, took his pain meds, and got relief. Patient states later he started having pain again, took two hydrocodone and then began having chills. Patient vomiting in triage, states this is the second time today. Patient surgery by Dr Jolyn Lent, at [facility]. Patient states he did not go to [facility] as he lives in [place].

## 2016-01-09 NOTE — ED Provider Notes (Addendum)
[facility] DEPT Provider Note   CSN: [id number] Arrival date & time: 01/09/16  1535  First Provider Contact:  First MD Initiated Contact with Patient 01/09/16 1612        History   Chief Complaint Chief Complaint  Patient presents with  . Abdominal Pain    recent gallbladder removal    HPI [name] is a 55 y.o. male.  54 year old male with extensive past medical history including type 2 diabetes mellitus, CKD, multiple myeloma who presents with abdominal pain. The patient had a laparoscopic cholecystectomy at an outside hospital on 8/4. He was discharged 6 days ago and has been recovering appropriately at home. This morning, the patient woke up with pain at his right lower surgical site. He took pain medications which initially relieved the pain but it returned and is now not responding to his home medication. Last dose of hydrocodone was at 2:20 PM, which was also his last PO intake. His pain is currently severe and constant, worst in his right lower abdomen. He has had a few episodes of vomiting and also chills. Last bowel movement was earlier today and was normal with no blood. No drainage from his surgical site. No cough/cold symptoms. No urinary symptoms.   The history is provided by the patient.  Abdominal Pain      Past Medical History:  Diagnosis Date  . Diabetes mellitus without complication ([place])   . Hypertension   . Multiple myeloma ([place])   . Renal disorder     Patient Active Problem List   Diagnosis Date Noted  . Sepsis ([place]) 01/09/2016  . CKD (chronic kidney disease), stage V ([place]) 01/09/2016  . Hyperkalemia 01/09/2016  . Pyelonephritis 01/09/2016  . Multiple myeloma ([place])   . Hypertension   . Diabetes mellitus without complication [facility])     Past Surgical History:  Procedure Laterality Date  . CHOLECYSTECTOMY    . DIALYSIS FISTULA CREATION    . knee Left        Home Medications    Prior to Admission medications   Medication Sig  Start Date End Date Taking? Authorizing Provider  amLODipine (NORVASC) 2.5 MG tablet Take 2.5 mg by mouth daily. 05/11/15  Yes Historical Provider, MD  aspirin (GOODSENSE ASPIRIN) 325 MG tablet Take 325 mg by mouth at bedtime.    Yes Historical Provider, MD  B Complex-C-Folic Acid (NEPHRO-VITE PO) Take 1 tablet by mouth daily.   Yes Historical Provider, MD  calcitRIOL (ROCALTROL) 0.5 MCG capsule Take 0.5 mcg by mouth as directed. Take 0.70mg daily except on Saturday and Sunday take 0.515m twice daily   Yes Historical Provider, MD  cetirizine (ZYRTEC) 10 MG tablet Take 10 mg by mouth daily.   Yes Historical Provider, MD  Co-Enzyme Q-10 30 MG CAPS Take 30 mg by mouth daily.   Yes Historical Provider, MD  doxazosin (CARDURA) 4 MG tablet Take 4 mg by mouth at bedtime.   Yes Historical Provider, MD  escitalopram (LEXAPRO) 10 MG tablet Take 10 mg by mouth at bedtime.    Yes Historical Provider, MD  febuxostat (ULORIC) 40 MG tablet Take 40 mg by mouth daily.   Yes Historical Provider, MD  fluticasone (FLONASE) 50 MCG/ACT nasal spray Place 1 spray into the nose daily as needed for allergies.    Yes Historical Provider, MD  HYDROcodone-acetaminophen (NORCO/VICODIN) 5-325 MG tablet Take 1-2 tablets by mouth every 4 (four) hours as needed for pain. 12/31/15  Yes Historical Provider, MD  lenalidomide (REVLIMID) 5  MG capsule Take 5 mg by mouth at bedtime. On for x21 days then off for 7 days 11/08/15  Yes Historical Provider, MD  magnesium oxide (MAG-OX) 400 MG tablet Take 1 tablet (400 mg total) by mouth 2 (two) times daily. 11/12/15  Yes Nicole Pisciotta, PA-C  omeprazole (PRILOSEC) 20 MG capsule Take 20 mg by mouth daily.   Yes Historical Provider, MD  ondansetron (ZOFRAN ODT) 4 MG disintegrating tablet Take 1 tablet (4 mg total) by mouth every 8 (eight) hours as needed for nausea or vomiting. 11/12/15  Yes Nicole Pisciotta, PA-C  promethazine (PHENERGAN) 25 MG tablet Take 1 tablet (25 mg total) by mouth every 6  (six) hours as needed for nausea or vomiting. 11/12/15  Yes Nicole Pisciotta, PA-C  senna (SENOKOT) 8.6 MG tablet Take 1 tablet by mouth every evening.    Yes Historical Provider, MD  sodium bicarbonate 650 MG tablet Take 2 tablets (1,300 mg total) by mouth 4 (four) times daily. 11/12/15  Yes Nicole Pisciotta, PA-C  cephALEXin (KEFLEX) 500 MG capsule Take 1 capsule (500 mg total) by mouth 2 (two) times daily. Patient not taking: Reported on 01/09/2016 11/12/15   Monico Blitz, PA-C    Family History History reviewed. No pertinent family history.  Social History Social History  Substance Use Topics  . Smoking status: Never Smoker  . Smokeless tobacco: Never Used  . Alcohol use No     Allergies   Tape   Review of Systems Review of Systems  Gastrointestinal: Positive for abdominal pain.   10 Systems reviewed and are negative for acute change except as noted in the HPI.   Physical Exam Updated Vital Signs BP 120/73   Pulse 78   Temp 100.2 F (37.9 C) (Oral)   Resp 26   Ht 6' 1"  (1.854 m)   Wt 272 lb (123.4 kg)   SpO2 99%   BMI 35.89 kg/m   Physical Exam  Constitutional: He is oriented to person, place, and time. He appears well-developed and well-nourished. He appears distressed.  In pain  HENT:  Head: Normocephalic and atraumatic.  Moist mucous membranes  Eyes: Conjunctivae are normal. Pupils are equal, round, and reactive to light.  Neck: Neck supple.  Cardiovascular: Normal rate, regular rhythm and normal heart sounds.   No murmur heard. Pulmonary/Chest: Effort normal and breath sounds normal.  Abdominal: Soft. Bowel sounds are normal. He exhibits no distension. There is tenderness.  TTP RLQ and R mid abdomen over 2 laproscopic incision sites; all surgical sites clean/dry/intact  Musculoskeletal: He exhibits no edema.  AV fistula L upper arm w/ papable thrill  Neurological: He is alert and oriented to person, place, and time.  Fluent speech  Skin: Skin is  warm and dry.  Psychiatric: Judgment normal.  distressed  Nursing note and vitals reviewed.    ED Treatments / Results  Labs (all labs ordered are listed, but only abnormal results are displayed) Labs Reviewed  LIPASE, BLOOD - Abnormal; Notable for the following:       Result Value   Lipase 53 (*)    All other components within normal limits  COMPREHENSIVE METABOLIC PANEL - Abnormal; Notable for the following:    Potassium 5.6 (*)    Glucose, Bld 100 (*)    BUN 37 (*)    Creatinine, Ser 5.62 (*)    Calcium 8.7 (*)    Albumin 3.4 (*)    GFR calc non Af Amer 10 (*)    GFR calc Af  Amer 12 (*)    All other components within normal limits  CBC - Abnormal; Notable for the following:    WBC 2.2 (*)    RBC 3.29 (*)    Hemoglobin 10.7 (*)    HCT 32.5 (*)    RDW 17.0 (*)    All other components within normal limits  URINALYSIS, ROUTINE W REFLEX MICROSCOPIC (NOT AT [facility]) - Abnormal; Notable for the following:    APPearance CLOUDY (*)    Hgb urine dipstick TRACE (*)    Protein, ur 100 (*)    Leukocytes, UA MODERATE (*)    All other components within normal limits  URINE MICROSCOPIC-ADD ON - Abnormal; Notable for the following:    Bacteria, UA MANY (*)    All other components within normal limits  I-STAT CG4 LACTIC ACID, ED - Abnormal; Notable for the following:    Lactic Acid, Venous 2.66 (*)    All other components within normal limits  I-STAT CG4 LACTIC ACID, ED - Abnormal; Notable for the following:    Lactic Acid, Venous 2.74 (*)    All other components within normal limits  URINE CULTURE  CULTURE, BLOOD (ROUTINE X 2)  CULTURE, BLOOD (ROUTINE X 2)    EKG  EKG Interpretation  Date/Time:  Sunday January 09 2016 19:28:47 EDT Ventricular Rate:  88 PR Interval:    QRS Duration: 88 QT Interval:  354 QTC Calculation: 429 R Axis:   55 Text Interpretation:  Sinus rhythm No significant change since last tracing Confirmed by Texas Souter MD, Lovelyn Sheeran [phone]) on 01/09/2016 9:00:36  PM       Radiology Ct Abdomen Pelvis Wo Contrast  Result Date: 01/09/2016 CLINICAL DATA:  55 year old male with right side abdominal pain after recent cholecystectomy. Fever. Initial encounter. Stage IV multiple myeloma. EXAM: CT ABDOMEN AND PELVIS WITHOUT CONTRAST TECHNIQUE: Multidetector CT imaging of the abdomen and pelvis was performed following the standard protocol without IV contrast. COMPARISON:  [facility] CT Abdomen and Pelvis 10/05/2008. FINDINGS: Respiratory motion artifact at the lung bases which appear clear. Cardiomegaly is new since 2010. No pericardial or pleural effusion. The visible spine appears intact. Chronic severe expansile bony lesions about the left acetabulum and affecting the bilateral pubic rami. These appears somewhat more healed/ sclerotic than on the 2010 exam. No new osseous lesion identified. No pelvic free fluid. Fairly decompressed rectum. Redundant sigmoid colon tracking into the right lower quadrant and mildly gas distended there, but otherwise negative. Mild diverticulosis of the left colon which is otherwise negative aside from retained stool. Mild gas and stool distension of the transverse colon. Fluid in the right colon. Negative retrocecal appendix. Negative terminal ileum. Multiple mildly dilated contrast containing proximal small bowel loops up to 3 cm diameter. There is however relatively gradual transition to decompressed distal small bowel loops which occurs in the left lower quadrant (series 2, image 66). No abdominal free air or free fluid. Stomach mildly distended with contrast. Negative duodenum. Gallbladder is now surgically absent. Surgical clips in the gallbladder fossa plus a small volume of intermediate density material. No biliary ductal enlargement identified. Negative noncontrast liver otherwise. Negative noncontrast spleen, pancreas and adrenal glands. However, both kidneys appear abnormal. There is right moderate to severe  hydronephrosis with right para renal stranding. The proximal right ureter also appears enlarged and indistinct with periureteral stranding. However, the right ureter quickly tapers and no obstructing etiology is identified. There is mild to moderate left para renal stranding without hydronephrosis or hydroureter.  No abnormality of the left ureter. No lymphadenopathy. IMPRESSION: 1. Right hydronephrosis with perinephric and proximal periureteral inflammatory stranding, but no obstructing etiology identified. Mild to moderate left perinephric stranding without hydronephrosis. These findings are nonspecific, and an infected obstructed right kidney is difficult to exclude in this setting. Query abnormal urinalysis. 2. Surgically absent gallbladder with minimal postoperative changes in the gallbladder fossa and no adverse features. 3. Partial or early small bowel obstruction, but without abrupt transition point. There is a gradual transition to normal small bowel loops in the left lower quadrant. 4. Cardiomegaly, new since 2010. 5. Chronic expansile lytic lesions of the left pelvis and pubic rami compatible with stated history of multiple myeloma. Electronically Signed   By: Odessa Fleming M.D.   On: 01/09/2016 20:54    Procedures .Critical Care Performed by: Laurence Spates Authorized by: Laurence Spates   Critical care provider statement:    Critical care time (minutes):  75   Critical care time was exclusive of:  Separately billable procedures and treating other patients   Critical care was necessary to treat or prevent imminent or life-threatening deterioration of the following conditions:  Sepsis   Critical care was time spent personally by me on the following activities:  Development of treatment plan with patient or surrogate, discussions with consultants, evaluation of patient's response to treatment, examination of patient, obtaining history from patient or surrogate, review of old charts,  re-evaluation of patient's condition, ordering and review of radiographic studies, ordering and performing treatments and interventions and ordering and review of laboratory studies   (including critical care time)  Medications Ordered in ED Medications  dextrose 50 % solution 50 mL (not administered)  insulin aspart (novoLOG) injection 5 Units (not administered)  calcium gluconate 1 g in sodium chloride 0.9 % 100 mL IVPB (not administered)  sodium polystyrene (KAYEXALATE) 15 GM/60ML suspension 30 g (not administered)  ondansetron (ZOFRAN) injection 4 mg (4 mg Intravenous Given 01/09/16 1650)  HYDROmorphone (DILAUDID) injection 1 mg (1 mg Intravenous Given 01/09/16 1650)  sodium chloride 0.9 % bolus 1,000 mL (0 mLs Intravenous Stopped 01/09/16 1831)  acetaminophen (TYLENOL) suppository 650 mg (650 mg Rectal Given 01/09/16 1901)  piperacillin-tazobactam (ZOSYN) IVPB 3.375 g (0 g Intravenous Stopped 01/09/16 1945)  vancomycin (VANCOCIN) 1,500 mg in sodium chloride 0.9 % 500 mL IVPB (0 mg Intravenous Stopped 01/09/16 2203)  sodium chloride 0.9 % bolus 500 mL (0 mLs Intravenous Stopped 01/09/16 2123)  sodium chloride 0.9 % bolus 1,000 mL (0 mLs Intravenous Stopped 01/09/16 2333)     Initial Impression / Assessment and Plan / ED Course  I have reviewed the triage vital signs and the nursing notes.  Pertinent labs & imaging results that were available during my care of the patient were reviewed by me and considered in my medical decision making (see chart for details).  Clinical Course   Patient with abdominal pain beginning this morning, recent laparoscopic cholecystectomy. He was in distress due to pain on exam. Initial vital signs were stable. He had right-sided tenderness over his right lower and right mid abdominal incision sites, no drainage or signs of infection at incisions. Gave the patient Zofran, Dilaudid, and an IV fluid bolus and obtained above lab work. Initial labs notable for WBC 2.2,  hemoglobin 10.7, potassium 5.6, creatinine 5.6, lipase 53.   During his ED course, the patient developed a fever to 101.3. Immediately obtained blood and urine cultures and initiated antibiotics with vancomycin and Zosyn. Obtained a noncontrasted CT  as the patient states that he still produces urine and has not had dialysis since 2012, when his kidneys recovered enough to stop dialysis.  Gave patient several small fluid boluses as I am concerned about over-aggressive hydration given his renal failure.  CT showed right hydronephrosis with stranding suggestive of infection, radiologist could not rule out obstruction although did not note a stone. Also noted partial versus early small bowel obstruction. Because of concern for possible infected stone, I reviewed the images with urology, Dr.McKenzie, who looked at CT and felt that findings were related to pyelo and not a stone. He recommended usual pyelo treatment. Regarding obstruction, discussed w/ on-call general surgeon at [facility], where pt had original surgery. Dr. Littie Deeds recommended transfer to [facility] because all the patient's specialists are there. I discussed with [facility] hospitalist, Dr. Anders Grant, who is willing to except the patient but later informed me that they had no available beds and were not accepting transfers at this time. He recommended calling back tomorrow morning to discuss transfer. I contacted hospitalist here and discussed with Dr.Niu, who will accept pt for admission. Discussed case w/ general surgeon here, Dr. Zella Richer, who reviewed films and feels that he likely has an ileus related to the infection rather than an obstruction. He has recommended holding on NG tube for now and contacting general surgery tomorrow if the patient is not transferred. Patient with stable vital signs and improving fever on reexamination. Patient admitted to stepdown for further care.  Final Clinical Impressions(s) / ED Diagnoses   Final diagnoses:    Sepsis, due to unspecified organism [facility])  Pyelonephritis  Small bowel obstruction ([place])  Hyperkalemia  CKD (chronic kidney disease), unspecified stage  Multiple myeloma, remission status unspecified ([place])  Essential hypertension  Diabetes mellitus without complication [facility])    New Prescriptions New Prescriptions   No medications on file     Sharlett Iles, MD 01/09/16 [place], MD 01/09/16 [phone]

## 2016-01-09 NOTE — ED Notes (Signed)
Dr. Little at bedside.  

## 2016-01-09 NOTE — ED Notes (Signed)
Unsuccessfully attempted x2 to collect second set of cultures and place 2nd iv.

## 2016-01-10 ENCOUNTER — Encounter: Payer: Self-pay | Admitting: General Practice

## 2016-01-10 ENCOUNTER — Encounter (HOSPITAL_COMMUNITY): Payer: Self-pay | Admitting: Internal Medicine

## 2016-01-10 DIAGNOSIS — K56600 Partial intestinal obstruction, unspecified as to cause: Secondary | ICD-10-CM | POA: Diagnosis present

## 2016-01-10 DIAGNOSIS — N1330 Unspecified hydronephrosis: Secondary | ICD-10-CM | POA: Diagnosis present

## 2016-01-10 LAB — COMPREHENSIVE METABOLIC PANEL
ALT: 32 U/L (ref 17–63)
AST: 42 U/L — ABNORMAL HIGH (ref 15–41)
Albumin: 3 g/dL — ABNORMAL LOW (ref 3.5–5.0)
Alkaline Phosphatase: 66 U/L (ref 38–126)
Anion gap: 6 (ref 5–15)
BILIRUBIN TOTAL: 1.3 mg/dL — AB (ref 0.3–1.2)
BUN: 38 mg/dL — ABNORMAL HIGH (ref 6–20)
CHLORIDE: 104 mmol/L (ref 101–111)
CO2: 23 mmol/L (ref 22–32)
Calcium: 7.8 mg/dL — ABNORMAL LOW (ref 8.9–10.3)
Creatinine, Ser: 6.1 mg/dL — ABNORMAL HIGH (ref 0.61–1.24)
GFR, EST AFRICAN AMERICAN: 11 mL/min — AB (ref >60)
GFR, EST NON AFRICAN AMERICAN: 9 mL/min — AB (ref >60)
Glucose, Bld: 156 mg/dL — ABNORMAL HIGH (ref 65–99)
POTASSIUM: 5.8 mmol/L — AB (ref 3.5–5.1)
Sodium: 133 mmol/L — ABNORMAL LOW (ref 135–145)
TOTAL PROTEIN: 6.8 g/dL (ref 6.5–8.1)

## 2016-01-10 LAB — CBC
HEMATOCRIT: 28 % — AB (ref 39.0–52.0)
Hemoglobin: 9.4 g/dL — ABNORMAL LOW (ref 13.0–17.0)
MCH: 32.9 pg (ref 26.0–34.0)
MCHC: 33.6 g/dL (ref 30.0–36.0)
MCV: 97.9 fL (ref 78.0–100.0)
PLATELETS: 201 10*3/uL (ref 150–400)
RBC: 2.86 MIL/uL — ABNORMAL LOW (ref 4.22–5.81)
RDW: 17.4 % — AB (ref 11.5–15.5)
WBC: 12.4 10*3/uL — AB (ref 4.0–10.5)

## 2016-01-10 LAB — BLOOD CULTURE ID PANEL (REFLEXED)
ACINETOBACTER BAUMANNII: NOT DETECTED
CANDIDA ALBICANS: NOT DETECTED
CANDIDA KRUSEI: NOT DETECTED
CANDIDA PARAPSILOSIS: NOT DETECTED
CARBAPENEM RESISTANCE: NOT DETECTED
Candida glabrata: NOT DETECTED
Candida tropicalis: NOT DETECTED
ENTEROBACTER CLOACAE COMPLEX: NOT DETECTED
ENTEROBACTERIACEAE SPECIES: DETECTED — AB
ESCHERICHIA COLI: NOT DETECTED
Enterococcus species: NOT DETECTED
Haemophilus influenzae: NOT DETECTED
KLEBSIELLA OXYTOCA: NOT DETECTED
KLEBSIELLA PNEUMONIAE: DETECTED — AB
Listeria monocytogenes: NOT DETECTED
Methicillin resistance: NOT DETECTED
NEISSERIA MENINGITIDIS: NOT DETECTED
PSEUDOMONAS AERUGINOSA: NOT DETECTED
Proteus species: NOT DETECTED
STAPHYLOCOCCUS SPECIES: NOT DETECTED
STREPTOCOCCUS AGALACTIAE: NOT DETECTED
STREPTOCOCCUS PNEUMONIAE: NOT DETECTED
STREPTOCOCCUS SPECIES: NOT DETECTED
Serratia marcescens: NOT DETECTED
Staphylococcus aureus (BCID): NOT DETECTED
Streptococcus pyogenes: NOT DETECTED
Vancomycin resistance: NOT DETECTED

## 2016-01-10 LAB — DIFFERENTIAL
BASOS ABS: 0 10*3/uL (ref 0.0–0.1)
BASOS PCT: 0 %
EOS PCT: 0 %
Eosinophils Absolute: 0 10*3/uL (ref 0.0–0.7)
LYMPHS ABS: 0.2 10*3/uL — AB (ref 0.7–4.0)
LYMPHS PCT: 2 %
MONOS PCT: 4 %
Monocytes Absolute: 0.5 10*3/uL (ref 0.1–1.0)
NEUTROS ABS: 11.7 10*3/uL — AB (ref 1.7–7.7)
Neutrophils Relative %: 94 %
WBC Morphology: INCREASED

## 2016-01-10 LAB — MRSA PCR SCREENING: MRSA by PCR: NEGATIVE

## 2016-01-10 LAB — CBC WITH DIFFERENTIAL/PLATELET

## 2016-01-10 LAB — BASIC METABOLIC PANEL
Anion gap: 7 (ref 5–15)
Anion gap: 6 (ref 5–15)
BUN: 40 mg/dL — ABNORMAL HIGH (ref 6–20)
BUN: 44 mg/dL — AB (ref 6–20)
CALCIUM: 8.1 mg/dL — AB (ref 8.9–10.3)
CALCIUM: 7.9 mg/dL — AB (ref 8.9–10.3)
CHLORIDE: 107 mmol/L (ref 101–111)
CO2: 23 mmol/L (ref 22–32)
CO2: 23 mmol/L (ref 22–32)
CREATININE: 6.34 mg/dL — AB (ref 0.61–1.24)
CREATININE: 6.79 mg/dL — AB (ref 0.61–1.24)
Chloride: 108 mmol/L (ref 101–111)
GFR calc non Af Amer: 9 mL/min — ABNORMAL LOW (ref >60)
GFR calc non Af Amer: 8 mL/min — ABNORMAL LOW (ref >60)
GFR, EST AFRICAN AMERICAN: 10 mL/min — AB (ref >60)
GFR, EST AFRICAN AMERICAN: 9 mL/min — AB (ref >60)
Glucose, Bld: 106 mg/dL — ABNORMAL HIGH (ref 65–99)
Glucose, Bld: 88 mg/dL (ref 65–99)
Potassium: 6 mmol/L — ABNORMAL HIGH (ref 3.5–5.1)
Potassium: 5.6 mmol/L — ABNORMAL HIGH (ref 3.5–5.1)
SODIUM: 137 mmol/L (ref 135–145)
SODIUM: 137 mmol/L (ref 135–145)

## 2016-01-10 LAB — APTT: aPTT: 35 seconds (ref 24–36)

## 2016-01-10 LAB — GLUCOSE, CAPILLARY
GLUCOSE-CAPILLARY: 110 mg/dL — AB (ref 65–99)
GLUCOSE-CAPILLARY: 107 mg/dL — AB (ref 65–99)
Glucose-Capillary: 109 mg/dL — ABNORMAL HIGH (ref 65–99)

## 2016-01-10 LAB — LACTIC ACID, PLASMA
LACTIC ACID, VENOUS: 3.3 mmol/L — AB (ref 0.5–1.9)
LACTIC ACID, VENOUS: 2.6 mmol/L — AB (ref 0.5–1.9)
Lactic Acid, Venous: 1.6 mmol/L (ref 0.5–1.9)
Lactic Acid, Venous: 1.3 mmol/L (ref 0.5–1.9)

## 2016-01-10 LAB — PROTIME-INR
INR: 1.19
PROTHROMBIN TIME: 15.1 s (ref 11.4–15.2)

## 2016-01-10 LAB — PROCALCITONIN: Procalcitonin: 144.94 ng/mL

## 2016-01-10 MED ORDER — FEBUXOSTAT 40 MG PO TABS
40.0000 mg | ORAL_TABLET | Freq: Every day | ORAL | Status: DC
  Administered 2016-01-10 – 2016-01-18 (×9): 40 mg via ORAL
  Filled 2016-01-10 (×9): qty 1

## 2016-01-10 MED ORDER — DEXTROSE 5 % IV SOLN
2.0000 g | INTRAVENOUS | Status: DC
  Administered 2016-01-10 – 2016-01-13 (×4): 2 g via INTRAVENOUS
  Filled 2016-01-10 (×5): qty 2

## 2016-01-10 MED ORDER — INSULIN ASPART 100 UNIT/ML IV SOLN
5.0000 [IU] | Freq: Once | INTRAVENOUS | Status: AC
  Administered 2016-01-10: 5 [IU] via INTRAVENOUS

## 2016-01-10 MED ORDER — PIPERACILLIN-TAZOBACTAM 3.375 G IVPB
3.3750 g | Freq: Three times a day (TID) | INTRAVENOUS | Status: DC
  Administered 2016-01-10: 3.375 g via INTRAVENOUS
  Filled 2016-01-10: qty 50

## 2016-01-10 MED ORDER — AMLODIPINE BESYLATE 5 MG PO TABS
2.5000 mg | ORAL_TABLET | Freq: Every day | ORAL | Status: DC
  Administered 2016-01-10 – 2016-01-18 (×9): 2.5 mg via ORAL
  Filled 2016-01-10 (×9): qty 1

## 2016-01-10 MED ORDER — ACETAMINOPHEN 325 MG PO TABS
650.0000 mg | ORAL_TABLET | Freq: Four times a day (QID) | ORAL | Status: DC | PRN
  Administered 2016-01-11 – 2016-01-17 (×7): 650 mg via ORAL
  Filled 2016-01-10 (×7): qty 2

## 2016-01-10 MED ORDER — SODIUM POLYSTYRENE SULFONATE 15 GM/60ML PO SUSP
45.0000 g | Freq: Once | ORAL | Status: AC
  Administered 2016-01-10: 45 g via RECTAL
  Filled 2016-01-10: qty 180

## 2016-01-10 MED ORDER — PANTOPRAZOLE SODIUM 40 MG PO TBEC
40.0000 mg | DELAYED_RELEASE_TABLET | Freq: Every day | ORAL | Status: DC
  Administered 2016-01-10 – 2016-01-18 (×9): 40 mg via ORAL
  Filled 2016-01-10 (×9): qty 1

## 2016-01-10 MED ORDER — FLUTICASONE PROPIONATE 50 MCG/ACT NA SUSP
1.0000 | Freq: Every day | NASAL | Status: DC
  Administered 2016-01-10 – 2016-01-12 (×2): 1 via NASAL
  Filled 2016-01-10: qty 16

## 2016-01-10 MED ORDER — CO-ENZYME Q-10 30 MG PO CAPS: 30.0000 mg | ORAL_CAPSULE | Freq: Every day | ORAL | Status: DC

## 2016-01-10 MED ORDER — SODIUM BICARBONATE 650 MG PO TABS
1300.0000 mg | ORAL_TABLET | Freq: Four times a day (QID) | ORAL | Status: DC
  Administered 2016-01-10 – 2016-01-18 (×29): 1300 mg via ORAL
  Filled 2016-01-10 (×32): qty 2

## 2016-01-10 MED ORDER — RENA-VITE PO TABS
1.0000 | ORAL_TABLET | Freq: Every day | ORAL | Status: DC
Start: 2016-01-10 — End: 2016-01-18
  Administered 2016-01-10 – 2016-01-18 (×9): 1 via ORAL
  Filled 2016-01-10 (×9): qty 1

## 2016-01-10 MED ORDER — HYDROCODONE-ACETAMINOPHEN 5-325 MG PO TABS
1.0000 | ORAL_TABLET | ORAL | Status: DC | PRN
  Administered 2016-01-10 – 2016-01-17 (×10): 2 via ORAL
  Filled 2016-01-10 (×11): qty 2

## 2016-01-10 MED ORDER — DOXAZOSIN MESYLATE 4 MG PO TABS
4.0000 mg | ORAL_TABLET | Freq: Every day | ORAL | Status: DC
  Administered 2016-01-10 – 2016-01-17 (×8): 4 mg via ORAL
  Filled 2016-01-10: qty 1
  Filled 2016-01-10: qty 4
  Filled 2016-01-10 (×5): qty 1
  Filled 2016-01-10: qty 4
  Filled 2016-01-10: qty 1

## 2016-01-10 MED ORDER — PNEUMOCOCCAL VAC POLYVALENT 25 MCG/0.5ML IJ INJ
0.5000 mL | INJECTION | INTRAMUSCULAR | Status: DC
  Filled 2016-01-10 (×2): qty 0.5

## 2016-01-10 MED ORDER — CALCITRIOL 0.5 MCG PO CAPS
0.5000 ug | ORAL_CAPSULE | ORAL | Status: DC
  Administered 2016-01-10 – 2016-01-18 (×7): 0.5 ug via ORAL
  Filled 2016-01-10 (×7): qty 1

## 2016-01-10 MED ORDER — ONDANSETRON HCL 4 MG/2ML IJ SOLN
4.0000 mg | Freq: Three times a day (TID) | INTRAMUSCULAR | Status: AC | PRN
  Administered 2016-01-10: 4 mg via INTRAVENOUS
  Filled 2016-01-10: qty 2

## 2016-01-10 MED ORDER — HYDROMORPHONE HCL 1 MG/ML IJ SOLN: 1.0000 mg | INTRAMUSCULAR | Status: DC | PRN

## 2016-01-10 MED ORDER — PIPERACILLIN-TAZOBACTAM IN DEX 2-0.25 GM/50ML IV SOLN
2.2500 g | Freq: Four times a day (QID) | INTRAVENOUS | Status: DC
  Administered 2016-01-10: 2.25 g via INTRAVENOUS
  Filled 2016-01-10 (×2): qty 50

## 2016-01-10 MED ORDER — SODIUM CHLORIDE 0.9 % IV SOLN
1.0000 g | Freq: Once | INTRAVENOUS | Status: AC
  Administered 2016-01-10: 1 g via INTRAVENOUS
  Filled 2016-01-10: qty 10

## 2016-01-10 MED ORDER — CALCITRIOL 0.5 MCG PO CAPS: 0.5000 ug | ORAL_CAPSULE | ORAL | Status: DC

## 2016-01-10 MED ORDER — SODIUM CHLORIDE 0.9 % IV SOLN
INTRAVENOUS | Status: DC
  Administered 2016-01-10 – 2016-01-11 (×4): via INTRAVENOUS

## 2016-01-10 MED ORDER — ESCITALOPRAM OXALATE 10 MG PO TABS
10.0000 mg | ORAL_TABLET | Freq: Every day | ORAL | Status: DC
  Administered 2016-01-10 – 2016-01-17 (×8): 10 mg via ORAL
  Filled 2016-01-10 (×8): qty 1

## 2016-01-10 MED ORDER — LORATADINE 10 MG PO TABS
10.0000 mg | ORAL_TABLET | Freq: Every day | ORAL | Status: DC
Start: 2016-01-10 — End: 2016-01-18
  Administered 2016-01-10 – 2016-01-18 (×9): 10 mg via ORAL
  Filled 2016-01-10 (×9): qty 1

## 2016-01-10 MED ORDER — HYDRALAZINE HCL 20 MG/ML IJ SOLN: 5.0000 mg | INTRAMUSCULAR | Status: DC | PRN

## 2016-01-10 MED ORDER — LENALIDOMIDE 5 MG PO CAPS: 5.0000 mg | ORAL_CAPSULE | Freq: Every day | ORAL | Status: DC

## 2016-01-10 MED ORDER — DEXTROSE 50 % IV SOLN
50.0000 mL | Freq: Once | INTRAVENOUS | Status: AC
  Administered 2016-01-10: 50 mL via INTRAVENOUS
  Filled 2016-01-10: qty 50

## 2016-01-10 MED ORDER — CALCITRIOL 0.5 MCG PO CAPS
0.5000 ug | ORAL_CAPSULE | ORAL | Status: DC
  Administered 2016-01-15 – 2016-01-16 (×3): 0.5 ug via ORAL
  Filled 2016-01-10 (×4): qty 1

## 2016-01-10 MED ORDER — ACETAMINOPHEN 650 MG RE SUPP: 650.0000 mg | Freq: Four times a day (QID) | RECTAL | Status: DC | PRN

## 2016-01-10 MED ORDER — SODIUM CHLORIDE 0.9 % IV BOLUS (SEPSIS)
500.0000 mL | Freq: Once | INTRAVENOUS | Status: AC
Start: 2016-01-10 — End: 2016-01-10
  Administered 2016-01-10: 500 mL via INTRAVENOUS

## 2016-01-10 MED ORDER — SENNA 8.6 MG PO TABS
1.0000 | ORAL_TABLET | Freq: Every evening | ORAL | Status: DC
  Administered 2016-01-10 – 2016-01-17 (×9): 8.6 mg via ORAL
  Filled 2016-01-10 (×9): qty 1

## 2016-01-10 MED ORDER — ONDANSETRON HCL 4 MG/2ML IJ SOLN
4.0000 mg | INTRAMUSCULAR | Status: DC | PRN
  Administered 2016-01-10 – 2016-01-14 (×4): 4 mg via INTRAVENOUS
  Filled 2016-01-10 (×4): qty 2

## 2016-01-10 MED ORDER — ASPIRIN 325 MG PO TABS
325.0000 mg | ORAL_TABLET | Freq: Every day | ORAL | Status: DC
  Administered 2016-01-10 – 2016-01-17 (×8): 325 mg via ORAL
  Filled 2016-01-10 (×8): qty 1

## 2016-01-10 MED ORDER — SODIUM CHLORIDE 0.9% FLUSH
3.0000 mL | Freq: Two times a day (BID) | INTRAVENOUS | Status: DC
  Administered 2016-01-10 – 2016-01-18 (×9): 3 mL via INTRAVENOUS

## 2016-01-10 NOTE — Progress Notes (Signed)
PHARMACIST - PHYSICIAN ORDER COMMUNICATION ° °CONCERNING: P&T Medication Policy on Herbal Medications ° °DESCRIPTION:  This patient’s order for:  CoEnzyme Q10  has been noted. ° °This product(s) is classified as an “herbal” or natural product. °Due to a lack of definitive safety studies or FDA approval, nonstandard manufacturing practices, plus the potential risk of unknown drug-drug interactions while on inpatient medications, the Pharmacy and Therapeutics Committee does not permit the use of “herbal” or natural products of this type within [place]. °  °ACTION TAKEN: °The pharmacy department is unable to verify this order at this time and your patient has been informed of this safety policy. °Please reevaluate patient’s clinical condition at discharge and address if the herbal or natural product(s) should be resumed at that time.  ° °Frannie Shedrick, PharmD °

## 2016-01-10 NOTE — Evaluation (Signed)
Physical Therapy Evaluation Patient Details Name: [name] MRN: [id number] DOB: 1960/09/07 Today's Date: 01/10/2016   History of Present Illness  55 y.o. male with medical history significant of multiple myeloma (on Revlimid), hypertension, GERD, gout, depression, CKD-V,  S/p of cholecystectomy last week, who presents with nausea, vomiting, abdominal pain and increased urinary frequency; diagnosed with Pyelonephritis and sepsis  Clinical Impression  Pt admitted with above diagnosis. Pt currently with functional limitations due to the deficits listed below (see PT Problem List).  Pt will benefit from skilled PT to increase their independence and safety with mobility to allow discharge to the venue listed below.   Pt required seated rest break during ambulation due to fatigue and feeling poorly (see below) however able to ambulate back to room after rest break.  Pt will likely progress to home with no f/u PT needs.  Recommended pt have 24/7 assist upon d/c for safety and he reports "someone" will likely be able to stay with him.     Follow Up Recommendations Supervision/Assistance - 24 hour;No PT follow up    Equipment Recommendations  None recommended by PT    Recommendations for Other Services       Precautions / Restrictions Precautions Precautions: Fall      Mobility  Bed Mobility               General bed mobility comments: pt in bathroom on arrival (used pull cord)  Transfers Overall transfer level: Needs assistance Equipment used: None Transfers: Sit to/from Stand Sit to Stand: Supervision            Ambulation/Gait Ambulation/Gait assistance: Min guard Ambulation Distance (Feet): 40 Feet (x2) Assistive device: None Gait Pattern/deviations: Step-through pattern;Decreased stride length     General Gait Details: pt typically uses quad cane however pushed IV pole today, pt required seated rest break after 40 feet due to fatigue and not feeling well,  BP 170/69 mmHg at that time, pt rested a little and then wished to ambulate back to room, observed LLD (pt reports due to lesions from multiple myeloma)  Stairs            Wheelchair Mobility    Modified Rankin (Stroke Patients Only)       Balance                                             Pertinent Vitals/Pain Pain Assessment: No/denies pain    Home Living Family/patient expects to be discharged to:: Private residence Living Arrangements: Alone Available Help at Discharge: Friend(s);Available 24 hours/day   Home Access: Stairs to enter   Entrance Stairs-Number of Steps: 3 Home Layout: One level Home Equipment: Cane - quad      Prior Function Level of Independence: Independent with assistive device(s)               Hand Dominance        Extremity/Trunk Assessment               Lower Extremity Assessment: Generalized weakness         Communication   Communication: No difficulties  Cognition Arousal/Alertness: Awake/alert Behavior During Therapy: WFL for tasks assessed/performed Overall Cognitive Status: Within Functional Limits for tasks assessed                      General Comments  Exercises        Assessment/Plan    PT Assessment Patient needs continued PT services  PT Diagnosis Difficulty walking;Generalized weakness   PT Problem List Decreased strength;Decreased activity tolerance;Decreased mobility;Cardiopulmonary status limiting activity  PT Treatment Interventions DME instruction;Gait training;Stair training;Functional mobility training;Therapeutic activities;Patient/family education;Therapeutic exercise   PT Goals (Current goals can be found in the Care Plan section) Acute Rehab PT Goals PT Goal Formulation: With patient Time For Goal Achievement: 01/17/16 Potential to Achieve Goals: Good    Frequency Min 3X/week   Barriers to discharge        Co-evaluation                End of Session Equipment Utilized During Treatment: Gait belt Activity Tolerance: Patient limited by fatigue Patient left: in chair;with call bell/phone within reach;with chair alarm set Nurse Communication: Mobility status;Patient requests pain meds         Time: 1120-1134 PT Time Calculation (min) (ACUTE ONLY): 14 min   Charges:   PT Evaluation $PT Eval Moderate Complexity: 1 Procedure     PT G Codes:        Heron Pitcock,KATHrine E 01/10/2016, 12:49 PM Carmelia Bake, PT, DPT 01/10/2016 Pager: [phone]

## 2016-01-10 NOTE — Progress Notes (Signed)
PHARMACY - PHYSICIAN COMMUNICATION CRITICAL VALUE ALERT - BLOOD CULTURE IDENTIFICATION (BCID)  Results for orders placed or performed during the hospital encounter of 01/09/16  Blood Culture ID Panel (Reflexed) (Collected: 01/09/2016  7:05 PM)  Result Value Ref Range   Enterococcus species NOT DETECTED NOT DETECTED   Vancomycin resistance NOT DETECTED NOT DETECTED   Listeria monocytogenes NOT DETECTED NOT DETECTED   Staphylococcus species NOT DETECTED NOT DETECTED   Staphylococcus aureus NOT DETECTED NOT DETECTED   Methicillin resistance NOT DETECTED NOT DETECTED   Streptococcus species NOT DETECTED NOT DETECTED   Streptococcus agalactiae NOT DETECTED NOT DETECTED   Streptococcus pneumoniae NOT DETECTED NOT DETECTED   Streptococcus pyogenes NOT DETECTED NOT DETECTED   Acinetobacter baumannii NOT DETECTED NOT DETECTED   Enterobacteriaceae species DETECTED (A) NOT DETECTED   Enterobacter cloacae complex NOT DETECTED NOT DETECTED   Escherichia coli NOT DETECTED NOT DETECTED   Klebsiella oxytoca NOT DETECTED NOT DETECTED   Klebsiella pneumoniae DETECTED (A) NOT DETECTED   Proteus species NOT DETECTED NOT DETECTED   Serratia marcescens NOT DETECTED NOT DETECTED   Carbapenem resistance NOT DETECTED NOT DETECTED   Haemophilus influenzae NOT DETECTED NOT DETECTED   Neisseria meningitidis NOT DETECTED NOT DETECTED   Pseudomonas aeruginosa NOT DETECTED NOT DETECTED   Candida albicans NOT DETECTED NOT DETECTED   Candida glabrata NOT DETECTED NOT DETECTED   Candida krusei NOT DETECTED NOT DETECTED   Candida parapsilosis NOT DETECTED NOT DETECTED   Candida tropicalis NOT DETECTED NOT DETECTED    Name of physician (or Provider) Contacted: Dr. Clementeen Graham paged at [phone] with no call back  Changes to prescribed antibiotics required: Patient currently on vancomycin and zosyn - in text page to Md, recommended switch to Rocephin 2g IV q24 per BCID recommendation guidelines  Kara Mead 01/10/2016  11:24 AM

## 2016-01-10 NOTE — Progress Notes (Signed)
   01/10/16 1200  Clinical Encounter Type  Visited With Patient  Visit Type Initial;Psychological support;Spiritual support;Critical Care  Referral From Nurse  Consult/Referral To Chaplain  Spiritual Encounters  Spiritual Needs Emotional;Other (Comment) (Pastoral Conversation/Support)  Stress Factors  Patient Stress Factors Health changes;Major life changes   Patient requested to see the Chaplain from the [facility], Lorrin Jackson; whom he has been working with. He also requested to speak with a Education officer, museum. The patient was very nauseous today and stated that he felt like he was going to throw up. I let the [place] know.     [place] M.Div.

## 2016-01-10 NOTE — Progress Notes (Addendum)
PROGRESS NOTE                                                                                                                                                                                                             Patient Demographics:    [name], is a 55 y.o. male, DOB - 05/02/1961, IRS:[id number]  Admit date - 01/09/2016   Admitting Physician Ivor Costa, MD  Outpatient Primary MD for the patient is Pcp Not In System  LOS - 1  Outpatient Specialists: oncologist and general surgeon in [place]  Chief Complaint  Patient presents with  . Abdominal Pain    recent gallbladder removal       Brief Narrative   55 year old male with history of multiple myeloma(On Revlimid, follows with oncologist in [facility]), hypertension, GERD, see Joellen Jersey states 5 (off dialysis since 2015), gout and depression who was admitted to hypoadrenal one week back and underwent cholecystectomy came to the ED with nausea, vomiting, abdominal pain and increased urinary frequency Patient found to be septic with fever of 102.98F, elevated lactic acid and neutropenia. 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 UA positive for UTI. CT of the abdomen and pelvis showed right hydronephrosis with perinephric and proximal periureteral stranding. Mild to moderate left perinephric stranding without hydronephrosis. Also commented on early versus partial small bowel obstruction. Admitted to stepdown unit.    Subjective:    Patient complains of mild epigastric discomfort . Denies dysuria, nausea or vomiting. Had 3 loose bowel movement overnight.   Assessment  & Plan :    Principal Problem:   Sepsis ([place]) Secondary to acute pyelonephritis Blood cultures growing gram-negative rods. Narrow antibiotics to IV Rocephin. Follow final urine and blood cultures. Supportive care with Tylenol and IV fluids. Lactic  acid normalized. Sepsis resolving. Urologist Dr Alyson Ingles was consulted by regarding CT findings of right hydronephrosis reviewed the imaging and recommended this is  pyelonephritis alone and no signs of obstruction.. Pain control with when necessary Dilaudid.   Active Problems:   Multiple myeloma [facility]) follows with oncologist in [facility]. Hold revlimid    Hypertension Stable. Continue Cardura.    Diabetes mellitus without complication ([place]) Monitor on sliding scale coverage.  start him on clear liquid.  Acute on CKD (chronic kidney disease), stage V (  [place]) Likely prerenal with sepsis and dehydration. Monitor with hydration. Consult renal if not improved despite hydration and resolution of sepsis.    Hyperkalemia Received IV insulin with D5, albuterol and Kayexalate. Stable on telemetry.    Partial small bowel obstruction ([facility]) Likely ileus due to acute sepsis, pyelonephritis. Surgery was consulted from ED and recommended conservative management. No bowel distention on exam with good bowel sounds. Placed on clear liquid. Serial abdominal exam.  Diarrhea 3 Suspect this is due to oral contrast received for CT scan. Monitor.  Status post recent cholecystectomy    Called [place] [facility] for patient transfer but no beds available today. Patient likely be managed here.   Code Status : Full code  Family Communication  : None at bedside  Disposition Plan  : Continue step down monitoring.  Barriers For Discharge : Active symptoms  Consults  :  None  Procedures  :  CT abdomen and pelvis  DVT Prophylaxis  :  Heparin  Lab Results  Component Value Date   PLT DUP SEE [id number] 01/10/2016    Antibiotics  :    Anti-infectives    Start     Dose/Rate Route Frequency Ordered Stop   01/10/16 1600  cefTRIAXone (ROCEPHIN) 2 g in dextrose 5 % 50 mL IVPB     2 g 100 mL/hr over 30 Minutes Intravenous Every 24 hours 01/10/16 1443     01/10/16 1200  piperacillin-tazobactam (ZOSYN)  IVPB 2.25 g  Status:  Discontinued     2.25 g 100 mL/hr over 30 Minutes Intravenous Every 6 hours 01/10/16 0741 01/10/16 1443   01/10/16 0300  piperacillin-tazobactam (ZOSYN) IVPB 3.375 g  Status:  Discontinued     3.375 g 12.5 mL/hr over 240 Minutes Intravenous Every 8 hours 01/10/16 0021 01/10/16 0741   01/09/16 1900  vancomycin (VANCOCIN) 1,500 mg in sodium chloride 0.9 % 500 mL IVPB     1,500 mg 250 mL/hr over 120 Minutes Intravenous  Once 01/09/16 1849 01/09/16 2203   01/09/16 1830  piperacillin-tazobactam (ZOSYN) IVPB 3.375 g     3.375 g 12.5 mL/hr over 240 Minutes Intravenous  Once 01/09/16 1827 01/09/16 1945        Objective:   Vitals:   01/10/16 0600 01/10/16 0800 01/10/16 1000 01/10/16 1200  BP: (!) 123/54 (!) 148/61 (!) 155/63   Pulse: 78 89 83   Resp: (!) 25 (!) 23 20   Temp:  99.6 F (37.6 C)  (!) 100.9 F (38.3 C)  TempSrc:  Oral  Oral  SpO2: 98% 97% 100%   Weight:      Height:        Wt Readings from Last 3 Encounters:  01/09/16 123.4 kg (272 lb)     Intake/Output Summary (Last 24 hours) at 01/10/16 1453 Last data filed at 01/10/16 1000  Gross per 24 hour  Intake          1046.25 ml  Output                0 ml  Net          1046.25 ml     Physical Exam  Gen: Appears fatigued HEENT: no pallor, dry mucosa supple neck Chest: clear b/l, no added sounds CVS: N S1&S2, no murmurs, rubs or gallop GI: soft, nondistended, mild epigastric tenderness, bowel sounds present, recent laparoscopy scar appears clean. Musculoskeletal: warm, no edema CNS: Alert and oriented    Data Review:    CBC  Recent Labs  Lab 01/09/16 1654 01/10/16 0421 01/10/16 0500  WBC 2.2* 12.4* DUP SEE [id number]  HGB 10.7* 9.4* DUP SEE [id number]  HCT 32.5* 28.0* DUP SEE [id number]  PLT 226 201 DUP SEE [id number]  MCV 98.8 97.9 DUP SEE [id number]  MCH 32.5 32.9 DUP SEE [id number]  MCHC 32.9 33.6 DUP SEE [id number]  RDW 17.0* 17.4* DUP SEE [id number]  LYMPHSABS  --  0.2* PENDING  MONOABS  --  0.5  PENDING  EOSABS  --  0.0 PENDING  BASOSABS  --  0.0 PENDING    Chemistries   Recent Labs Lab 01/09/16 1654 01/10/16 0133 01/10/16 0421 01/10/16 0955  NA 137 133* 137 137  K 5.6* 5.8* 6.0* 5.6*  CL 103 104 107 108  CO2 _0 GLUCOSE 100* 156* 106* 88  BUN 37* 38* 40* 44*  CREATININE 5.62* 6.10* 6.34* 6.79*  CALCIUM 8.7* 7.8* 8.1* 7.9*  AST 23 42*  --   --   ALT 23 32  --   --   ALKPHOS 91 66  --   --   BILITOT 0.7 1.3*  --   --    ------------------------------------------------------------------------------------------------------------------ No results for input(s): CHOL, HDL, LDLCALC, TRIG, CHOLHDL, LDLDIRECT in the last 72 hours.  No results found for: HGBA1C ------------------------------------------------------------------------------------------------------------------ No results for input(s): TSH, T4TOTAL, T3FREE, THYROIDAB in the last 72 hours.  Invalid input(s): FREET3 ------------------------------------------------------------------------------------------------------------------ No results for input(s): VITAMINB12, FOLATE, FERRITIN, TIBC, IRON, RETICCTPCT in the last 72 hours.  Coagulation profile  Recent Labs Lab 01/10/16 0133  INR 1.19    No results for input(s): DDIMER in the last 72 hours.  Cardiac Enzymes No results for input(s): CKMB, TROPONINI, MYOGLOBIN in the last 168 hours.  Invalid input(s): CK ------------------------------------------------------------------------------------------------------------------ No results found for: BNP  Inpatient Medications  Scheduled Meds: . amLODipine  2.5 mg Oral Daily  . aspirin  325 mg Oral QHS  . b complex-vitamin c-folic acid  1 tablet Oral Daily  . calcitRIOL  0.5 mcg Oral Once per day on Mon Tue Wed Thu Fri  . [START ON 01/15/2016] calcitRIOL  0.5 mcg Oral 2 times per day on Sun Sat  . cefTRIAXone (ROCEPHIN)  IV  2 g Intravenous Q24H  . doxazosin  4 mg Oral QHS  . escitalopram  10 mg  Oral QHS  . febuxostat  40 mg Oral Daily  . fluticasone  1 spray Each Nare Daily  . loratadine  10 mg Oral Daily  . pantoprazole  40 mg Oral Daily  . [START ON 01/11/2016] pneumococcal 23 valent vaccine  0.5 mL Intramuscular Tomorrow-1000  . senna  1 tablet Oral QPM  . sodium bicarbonate  1,300 mg Oral QID  . sodium chloride flush  3 mL Intravenous Q12H   Continuous Infusions: . sodium chloride 100 mL/hr at 01/10/16 1031   PRN Meds:.acetaminophen **OR** acetaminophen, hydrALAZINE, HYDROcodone-acetaminophen, HYDROmorphone (DILAUDID) injection  Micro Results Recent Results (from the past 240 hour(s))  Culture, blood (routine x 2)     Status: None (Preliminary result)   Collection Time: 01/09/16  7:05 PM  Result Value Ref Range Status   Specimen Description BLOOD RIGHT ANTECUBITAL  Final   Special Requests BOTTLES DRAWN AEROBIC AND ANAEROBIC 5CC  Final   Culture  Setup Time   Final    GRAM NEGATIVE RODS IN BOTH AEROBIC AND ANAEROBIC BOTTLES CRITICAL RESULT CALLED TO, READ BACK BY AND VERIFIED WITH: C SHADE,PHARMD AT 1010 01/10/16 BY Ronnie Derby Performed at [facility]  Culture GRAM NEGATIVE RODS  Final   Report Status PENDING  Incomplete  Blood Culture ID Panel (Reflexed)     Status: Abnormal   Collection Time: 01/09/16  7:05 PM  Result Value Ref Range Status   Enterococcus species NOT DETECTED NOT DETECTED Final   Vancomycin resistance NOT DETECTED NOT DETECTED Final   Listeria monocytogenes NOT DETECTED NOT DETECTED Final   Staphylococcus species NOT DETECTED NOT DETECTED Final   Staphylococcus aureus NOT DETECTED NOT DETECTED Final   Methicillin resistance NOT DETECTED NOT DETECTED Final   Streptococcus species NOT DETECTED NOT DETECTED Final   Streptococcus agalactiae NOT DETECTED NOT DETECTED Final   Streptococcus pneumoniae NOT DETECTED NOT DETECTED Final   Streptococcus pyogenes NOT DETECTED NOT DETECTED Final   Acinetobacter baumannii NOT DETECTED NOT DETECTED  Final   Enterobacteriaceae species DETECTED (A) NOT DETECTED Final    Comment: CRITICAL RESULT CALLED TO, READ BACK BY AND VERIFIED WITH: C. Shade Pharm.D. 10:10 01/10/16 ([email or web address])    Enterobacter cloacae complex NOT DETECTED NOT DETECTED Final   Escherichia coli NOT DETECTED NOT DETECTED Final   Klebsiella oxytoca NOT DETECTED NOT DETECTED Final   Klebsiella pneumoniae DETECTED (A) NOT DETECTED Final    Comment: CRITICAL RESULT CALLED TO, READ BACK BY AND VERIFIED WITH: C. Shade Pharm.D. 10:10 01/10/16  ([email or web address])    Proteus species NOT DETECTED NOT DETECTED Final   Serratia marcescens NOT DETECTED NOT DETECTED Final   Carbapenem resistance NOT DETECTED NOT DETECTED Final   Haemophilus influenzae NOT DETECTED NOT DETECTED Final   Neisseria meningitidis NOT DETECTED NOT DETECTED Final   Pseudomonas aeruginosa NOT DETECTED NOT DETECTED Final   Candida albicans NOT DETECTED NOT DETECTED Final   Candida glabrata NOT DETECTED NOT DETECTED Final   Candida krusei NOT DETECTED NOT DETECTED Final   Candida parapsilosis NOT DETECTED NOT DETECTED Final   Candida tropicalis NOT DETECTED NOT DETECTED Final  Culture, blood (routine x 2)     Status: None (Preliminary result)   Collection Time: 01/09/16  7:06 PM  Result Value Ref Range Status   Specimen Description BLOOD RIGHT HAND  Final   Special Requests IN PEDIATRIC BOTTLE 2CC  Final   Culture  Setup Time   Final    GRAM NEGATIVE RODS IN PEDIATRIC BOTTLE CRITICAL RESULT CALLED TO, READ BACK BY AND VERIFIED WITH: T SHADE,PHARMD AT 1010 01/10/16 BY Ronnie Derby Performed at [place]  Final   Report Status PENDING  Incomplete  MRSA PCR Screening     Status: None   Collection Time: 01/10/16 12:33 AM  Result Value Ref Range Status   MRSA by PCR NEGATIVE NEGATIVE Final    Comment:        The GeneXpert MRSA Assay (FDA approved for NASAL specimens only), is one component of a comprehensive MRSA  colonization surveillance program. It is not intended to diagnose MRSA infection nor to guide or monitor treatment for MRSA infections.     Radiology Reports Ct Abdomen Pelvis Wo Contrast  Result Date: 01/09/2016 CLINICAL DATA:  55 year old male with right side abdominal pain after recent cholecystectomy. Fever. Initial encounter. Stage IV multiple myeloma. EXAM: CT ABDOMEN AND PELVIS WITHOUT CONTRAST TECHNIQUE: Multidetector CT imaging of the abdomen and pelvis was performed following the standard protocol without IV contrast. COMPARISON:  [facility] CT Abdomen and Pelvis 10/05/2008. FINDINGS: Respiratory motion artifact at the lung bases which appear clear. Cardiomegaly is new since 2010. No pericardial  or pleural effusion. The visible spine appears intact. Chronic severe expansile bony lesions about the left acetabulum and affecting the bilateral pubic rami. These appears somewhat more healed/ sclerotic than on the 2010 exam. No new osseous lesion identified. No pelvic free fluid. Fairly decompressed rectum. Redundant sigmoid colon tracking into the right lower quadrant and mildly gas distended there, but otherwise negative. Mild diverticulosis of the left colon which is otherwise negative aside from retained stool. Mild gas and stool distension of the transverse colon. Fluid in the right colon. Negative retrocecal appendix. Negative terminal ileum. Multiple mildly dilated contrast containing proximal small bowel loops up to 3 cm diameter. There is however relatively gradual transition to decompressed distal small bowel loops which occurs in the left lower quadrant (series 2, image 66). No abdominal free air or free fluid. Stomach mildly distended with contrast. Negative duodenum. Gallbladder is now surgically absent. Surgical clips in the gallbladder fossa plus a small volume of intermediate density material. No biliary ductal enlargement identified. Negative noncontrast liver  otherwise. Negative noncontrast spleen, pancreas and adrenal glands. However, both kidneys appear abnormal. There is right moderate to severe hydronephrosis with right para renal stranding. The proximal right ureter also appears enlarged and indistinct with periureteral stranding. However, the right ureter quickly tapers and no obstructing etiology is identified. There is mild to moderate left para renal stranding without hydronephrosis or hydroureter. No abnormality of the left ureter. No lymphadenopathy. IMPRESSION: 1. Right hydronephrosis with perinephric and proximal periureteral inflammatory stranding, but no obstructing etiology identified. Mild to moderate left perinephric stranding without hydronephrosis. These findings are nonspecific, and an infected obstructed right kidney is difficult to exclude in this setting. Query abnormal urinalysis. 2. Surgically absent gallbladder with minimal postoperative changes in the gallbladder fossa and no adverse features. 3. Partial or early small bowel obstruction, but without abrupt transition point. There is a gradual transition to normal small bowel loops in the left lower quadrant. 4. Cardiomegaly, new since 2010. 5. Chronic expansile lytic lesions of the left pelvis and pubic rami compatible with stated history of multiple myeloma. Electronically Signed   By: Genevie Ann M.D.   On: 01/09/2016 20:54    Time Spent in minutes  35   Louellen Molder M.D on 01/10/2016 at 2:53 PM  Between 7am to 7pm - Pager - [phone]  After 7pm go to [email or web address] - password [facility]  [facility] -  Office  [phone]

## 2016-01-11 ENCOUNTER — Encounter: Payer: Self-pay | Admitting: General Practice

## 2016-01-11 LAB — CBC WITH DIFFERENTIAL/PLATELET
BASOS ABS: 0 10*3/uL (ref 0.0–0.1)
BASOS PCT: 0 %
Eosinophils Absolute: 0 10*3/uL (ref 0.0–0.7)
Eosinophils Relative: 0 %
HEMATOCRIT: 27.6 % — AB (ref 39.0–52.0)
HEMOGLOBIN: 9.2 g/dL — AB (ref 13.0–17.0)
LYMPHS PCT: 2 %
Lymphs Abs: 0.2 10*3/uL — ABNORMAL LOW (ref 0.7–4.0)
MCH: 32.9 pg (ref 26.0–34.0)
MCHC: 33.3 g/dL (ref 30.0–36.0)
MCV: 98.6 fL (ref 78.0–100.0)
MONOS PCT: 2 %
Monocytes Absolute: 0.2 10*3/uL (ref 0.1–1.0)
NEUTROS ABS: 10.3 10*3/uL — AB (ref 1.7–7.7)
Neutrophils Relative %: 96 %
Platelets: 159 10*3/uL (ref 150–400)
RBC: 2.8 MIL/uL — ABNORMAL LOW (ref 4.22–5.81)
RDW: 17.7 % — ABNORMAL HIGH (ref 11.5–15.5)
WBC Morphology: INCREASED
WBC: 10.7 10*3/uL — ABNORMAL HIGH (ref 4.0–10.5)

## 2016-01-11 LAB — BASIC METABOLIC PANEL
Anion gap: 8 (ref 5–15)
BUN: 50 mg/dL — ABNORMAL HIGH (ref 6–20)
CHLORIDE: 105 mmol/L (ref 101–111)
CO2: 22 mmol/L (ref 22–32)
CREATININE: 7.36 mg/dL — AB (ref 0.61–1.24)
Calcium: 7.5 mg/dL — ABNORMAL LOW (ref 8.9–10.3)
GFR calc non Af Amer: 7 mL/min — ABNORMAL LOW (ref >60)
GFR, EST AFRICAN AMERICAN: 9 mL/min — AB (ref >60)
Glucose, Bld: 120 mg/dL — ABNORMAL HIGH (ref 65–99)
Potassium: 4.6 mmol/L (ref 3.5–5.1)
Sodium: 135 mmol/L (ref 135–145)

## 2016-01-11 LAB — SODIUM, URINE, RANDOM: Sodium, Ur: 68 mmol/L

## 2016-01-11 LAB — CREATININE, URINE, RANDOM: CREATININE, URINE: 167.12 mg/dL

## 2016-01-11 LAB — GLUCOSE, CAPILLARY: GLUCOSE-CAPILLARY: 118 mg/dL — AB (ref 65–99)

## 2016-01-11 LAB — HEMOGLOBIN A1C
HEMOGLOBIN A1C: 5.2 % (ref 4.8–5.6)
MEAN PLASMA GLUCOSE: 103 mg/dL

## 2016-01-11 LAB — OSMOLALITY, URINE: Osmolality, Ur: 400 mOsm/kg (ref 300–900)

## 2016-01-11 NOTE — Progress Notes (Signed)
OT Cancellation Note  Patient Details Name: [name] MRN: [id number] DOB: 12-Jul-1960   Cancelled Treatment:    Reason Eval/Treat Not Completed: Other (comment).  Pt is preparing for transfer to 4th floor. Will try to see him up there this pm.  Kaisley Stiverson 01/11/2016, 1:51 PM  Lesle Chris, OTR/L [phone] 01/11/2016

## 2016-01-11 NOTE — Evaluation (Signed)
Occupational Therapy Evaluation Patient Details Name: [name] MRN: [id number] DOB: September 16, 1960 Today's Date: 01/11/2016    History of Present Illness 55 y.o. male with medical history significant of multiple myeloma (on Revlimid), hypertension, GERD, gout, depression, CKD-V,  S/p of cholecystectomy last week, who presents with nausea, vomiting, abdominal pain and increased urinary frequency; diagnosed with Pyelonephritis and sepsis   Clinical Impression   Pt was admitted for the above.  At baseline, he is mod I with adls.  He currently needs set up/supervision and min guard for ambulation.  Will follow in acute setting to further assess tub DME and for energy conservation    Follow Up Recommendations  Supervision/Assistance - 24 hour;No OT follow up    Equipment Recommendations   (to be further assessed)    Recommendations for Other Services       Precautions / Restrictions Precautions Precautions: Fall Restrictions Weight Bearing Restrictions: No      Mobility Bed Mobility Overal bed mobility: Modified Independent             General bed mobility comments: HOB raised  Transfers   Equipment used: None   Sit to Stand: Supervision              Balance                                            ADL Overall ADL's : Needs assistance/impaired     Grooming: Supervision/safety;Standing (shaving)   Upper Body Bathing: Supervision/ safety;Standing   Lower Body Bathing: Supervison/ safety;Sit to/from stand   Upper Body Dressing : Set up;Sitting   Lower Body Dressing: Supervision/safety;Sit to/from stand   Toilet Transfer: Min guard;Ambulation;comfort height commode   Toileting- Clothing Manipulation and Hygiene: Supervision/safety;Sit to/from stand         General ADL Comments: pt performed adl in bathroom.  Stood for a total of 20 minutes with 2 seated rest breaks.  Pt walked back from bathroom to room chair twice:  once  while light bulb was being replaced and once while I got more washcloths.  Tends to limp; usually uses quad cane.  Pt has a normal commode and feels he will be fine with this.  Had 2/4 dyspnea after ADL routine.  HR 69-89      Vision     Perception     Praxis      Pertinent Vitals/Pain Pain Assessment: No/denies pain     Hand Dominance     Extremity/Trunk Assessment Upper Extremity Assessment Upper Extremity Assessment: Overall WFL for tasks assessed           Communication Communication Communication: No difficulties   Cognition Arousal/Alertness: Awake/alert Behavior During Therapy: WFL for tasks assessed/performed Overall Cognitive Status: Within Functional Limits for tasks assessed                     General Comments       Exercises       Shoulder Instructions      Home Living Family/patient expects to be discharged to:: Private residence Living Arrangements: Alone Available Help at Discharge: Friend(s);Available 24 hours/day               Bathroom Shower/Tub: Tub/shower unit Shower/tub characteristics: Architectural technologist: Standard     Home Equipment: [employer] - quad          Prior  Functioning/Environment Level of Independence: Independent with assistive device(s)             OT Diagnosis: Generalized weakness   OT Problem List: Impaired balance (sitting and/or standing) (for energy conservation)   OT Treatment/Interventions: Self-care/ADL training;DME and/or AE instruction;Patient/family education;Balance training    OT Goals(Current goals can be found in the care plan section) Acute Rehab OT Goals Patient Stated Goal: none stated OT Goal Formulation: With patient Time For Goal Achievement: 01/18/16 Potential to Achieve Goals: Good ADL Goals Pt Will Perform Tub/Shower Transfer: Tub transfer;with supervision;ambulating (with seat/bench as needed) Additional ADL Goal #1: pt will verbalize 3 energy conservation techniques   OT Frequency: Min 2X/week   Barriers to D/C:            Co-evaluation              End of Session    Activity Tolerance: Patient tolerated treatment well Patient left: in bed;with call bell/phone within reach;with bed alarm set   Time: 0254-2706 OT Time Calculation (min): 30 min Charges:  OT General Charges $OT Visit: 1 Procedure OT Evaluation $OT Eval Low Complexity: 1 Procedure OT Treatments $Self Care/Home Management : 8-22 mins G-Codes:    SPENCER,MARYELLEN 2016-01-13, 3:31 PM Lesle Chris, OTR/L [phone] 01/13/16

## 2016-01-11 NOTE — Consult Note (Signed)
[name] Admit Date: 01/09/2016 01/11/2016 [name] Requesting Physician:  Dhungel MD  Reason for Consult:  AoCKD5 HPI:  74M seen at request of Dr. Clementeen [name] for the evaluation of the above problem. Past history includes multiple myeloma, hypertension, GERD. Patient was admitted on 8/13 with nausea, vomiting, abdominal pain, right flank pain. He had underwent an uneventful cholecystectomy the week before. He was febrile at the time of presentation. Evaluation including noncontrasted CT of the abdomen and pelvis identified no surgical site competitions but right sided perinephric and ureteral stranding with hydronephrosis. Urinalysis demonstrated pyuria and bacteria consistent with infection. He was admitted for right-sided pyelonephritis. Initial antibiotics are vancomycin and Zosyn now simplified to ceftriaxone. Blood cultures from admission are growing gram-negative rods in both collections with rapid evaluation suggesting Enterobacter and Klebsiella species.  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 He was on on dialysis in 2008 until 2012 as a consequence of myeloma and is currently followed by [name] neph[facility]. He has a mature left upper extremity fistula. He tells me that creatinine has ranged between 4.1 and 4.9 within the past 3 months have been in the fives. He tells me that his nephrologist has suggested he is nearing if not already reached the time to initiate dialysis but he is hesitant to do so. He endorses no current or recent unexplained nausea, vomiting, itching, swelling. It appears he takes calcitriol for hyperparathyroidism, sodium bicarbonate for chronic acidosis. He had hyperkalemia upon presentation which has improved with medical therapy.  No apparent exposures to IV contrast, nonsteroidals, sulfa antibiotics.  He currently feels much improved, with diminished abdominal and flank pain. Fevers have improved. He is tolerating food. He denies nausea, vomiting at the current  time.   Creatinine, Ser  Date Value  01/11/2016 7.36 mg/dL (H)  01/10/2016 6.79 mg/dL (H)  01/10/2016 6.34 mg/dL (H)  01/10/2016 6.10 mg/dL (H)  01/09/2016 5.62 mg/dL (H)  11/12/2015 6.34 mg/dL (H)  07/22/2007 7.99 (H)  07/20/2007 8.95 (H)  07/18/2007 9.52 (H)  07/16/2007 10.62 (H)  ] I/Os: I/O last 3 completed shifts: In: 3646.3 [P.O.:600; I.V.:2696.3; IV Piggyback:350] Out: 250 [Urine:250]   ROS Balance of 12 systems is negative w/ exceptions as above  PMH  Past Medical History:  Diagnosis Date  . Diabetes mellitus without complication ([name])   . Hypertension   . Multiple myeloma ([place])   . Renal disorder    PSH  Past Surgical History:  Procedure Laterality Date  . CHOLECYSTECTOMY    . DIALYSIS FISTULA CREATION    . knee Left    FH  Family History  Problem Relation Age of Onset  . Hypertension Mother   . Hypertension Father   . Multiple myeloma Brother    SH  reports that he has never smoked. He has never used smokeless tobacco. He reports that he does not drink alcohol or use drugs. Allergies  Allergies  Allergen Reactions  . Tape Rash    Paper   Home medications Prior to Admission medications   Medication Sig Start Date End Date Taking? Authorizing Provider  amLODipine (NORVASC) 2.5 MG tablet Take 2.5 mg by mouth daily. 05/11/15  Yes Historical Provider, MD  aspirin (GOODSENSE ASPIRIN) 325 MG tablet Take 325 mg by mouth at bedtime.    Yes Historical Provider, MD  B Complex-C-Folic Acid (NEPHRO-VITE PO) Take 1 tablet by mouth daily.   Yes Historical Provider, MD  calcitRIOL (ROCALTROL) 0.5 MCG capsule Take 0.5 mcg by mouth as directed. Take  0.1mg daily except on Saturday and Sunday take 0.510m twice daily   Yes Historical Provider, MD  cetirizine (ZYRTEC) 10 MG tablet Take 10 mg by mouth daily.   Yes Historical Provider, MD  Co-Enzyme Q-10 30 MG CAPS Take 30 mg by mouth daily.   Yes Historical Provider, MD  doxazosin (CARDURA) 4 MG tablet Take 4 mg by  mouth at bedtime.   Yes Historical Provider, MD  escitalopram (LEXAPRO) 10 MG tablet Take 10 mg by mouth at bedtime.    Yes Historical Provider, MD  febuxostat (ULORIC) 40 MG tablet Take 40 mg by mouth daily.   Yes Historical Provider, MD  fluticasone (FLONASE) 50 MCG/ACT nasal spray Place 1 spray into the nose daily as needed for allergies.    Yes Historical Provider, MD  HYDROcodone-acetaminophen (NORCO/VICODIN) 5-325 MG tablet Take 1-2 tablets by mouth every 4 (four) hours as needed for pain. 12/31/15  Yes Historical Provider, MD  lenalidomide (REVLIMID) 5 MG capsule Take 5 mg by mouth at bedtime. On for x21 days then off for 7 days 11/08/15  Yes Historical Provider, MD  magnesium oxide (MAG-OX) 400 MG tablet Take 1 tablet (400 mg total) by mouth 2 (two) times daily. 11/12/15  Yes [name] Pisciotta, PA-C  omeprazole (PRILOSEC) 20 MG capsule Take 20 mg by mouth daily.   Yes Historical Provider, MD  ondansetron (ZOFRAN ODT) 4 MG disintegrating tablet Take 1 tablet (4 mg total) by mouth every 8 (eight) hours as needed for nausea or vomiting. 11/12/15  Yes [name] Pisciotta, PA-C  promethazine (PHENERGAN) 25 MG tablet Take 1 tablet (25 mg total) by mouth every 6 (six) hours as needed for nausea or vomiting. 11/12/15  Yes [name] Pisciotta, PA-C  senna (SENOKOT) 8.6 MG tablet Take 1 tablet by mouth every evening.    Yes Historical Provider, MD  sodium bicarbonate 650 MG tablet Take 2 tablets (1,300 mg total) by mouth 4 (four) times daily. 11/12/15  Yes [name] Pisciotta, PA-C  cephALEXin (KEFLEX) 500 MG capsule Take 1 capsule (500 mg total) by mouth 2 (two) times daily. Patient not taking: Reported on 01/09/2016 11/12/15   [name] Ricksisciotta, PA-C    Current Medications Scheduled Meds: . amLODipine  2.5 mg Oral Daily  . aspirin  325 mg Oral QHS  . b complex-vitamin c-folic acid  1 tablet Oral Daily  . calcitRIOL  0.5 mcg Oral Once per day on Mon Tue Wed Thu Fri  . [START ON 01/15/2016] calcitRIOL  0.5 mcg Oral 2  times per day on Sun Sat  . cefTRIAXone (ROCEPHIN)  IV  2 g Intravenous Q24H  . doxazosin  4 mg Oral QHS  . escitalopram  10 mg Oral QHS  . febuxostat  40 mg Oral Daily  . fluticasone  1 spray Each Nare Daily  . loratadine  10 mg Oral Daily  . pantoprazole  40 mg Oral Daily  . pneumococcal 23 valent vaccine  0.5 mL Intramuscular Tomorrow-1000  . senna  1 tablet Oral QPM  . sodium bicarbonate  1,300 mg Oral QID  . sodium chloride flush  3 mL Intravenous Q12H   Continuous Infusions:  PRN Meds:.acetaminophen **OR** acetaminophen, hydrALAZINE, HYDROcodone-acetaminophen, ondansetron (ZOFRAN) IV  CBC  Recent Labs Lab 01/10/16 0421 01/10/16 0500 01/11/16 0330  WBC 12.4* DUP SEE [id number]*  NEUTROABS 11.7* PENDING 10.3*  HGB 9.4* DUP SEE [id number]*  HCT 28.0* DUP SEE [id number]*  MCV 97.9 DUP SEE [id number] 98.6  PLT 201 DUP SEE [id number]  Basic Metabolic Panel  Recent Labs Lab 01/09/16 1654 01/10/16 0133 01/10/16 0421 01/10/16 0955 01/11/16 0330  NA 137 133* 137 137 135  K 5.6* 5.8* 6.0* 5.6* 4.6  CL 103 104 107 108 105  CO2 26 23 23 23 22   GLUCOSE 100* 156* 106* 88 120*  BUN 37* 38* 40* 44* 50*  CREATININE 5.62* 6.10* 6.34* 6.79* 7.36*  CALCIUM 8.7* 7.8* 8.1* 7.9* 7.5*    Physical Exam  Blood pressure (!) 134/58, pulse 81, temperature 98.7 F (37.1 C), temperature source Oral, resp. rate (!) 21, height 6' 1"  (1.854 m), weight 125.8 kg (277 lb 5.4 oz), SpO2 100 %. GEN: NAD ENT: NCAT EYES: EOMI CV: RRR, no rub PULM: CTAB ABD: s/nt/nd SKIN: no rashes/lesions EXT:no LEE NEURO: nonfocal, no asterixus LUE AVF +B/T, mature   Assessment 2M w/ AoCKD5 in setting of R pyelonephritis +HN; GNR bacteremia; hx/o myeloma  1. AoCKD5, BL SCr ~5?; sees Dr Cay Schillings, pager#[phone] -- [facility] 1. CT A/P 8/13 with R HN and suggestion of Pyelonephrotis 2. Urology commented that stone unlikely cause 3. Mature LUE AVF 4. Not uremic, metabolically stable, no  immediate HD needs 2. GNR Bacteremia + R pyelonephritis on Ceftriaxone; per [facility] 3. MM clinically sounds to be in remission on Revlimid 4. 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 Hyperkalemia, resolved 6. HTN, stable  Plan 1. I see no immediate need for dialysis and possibly with improvement in his other issues his renal function will return to previous baseline 2. He would like to hold off on HD and see how he does, which I think is okay. 3. Cont calcitriol, NaHCO3 4. Daily weights, Daily Renal Panel, Strict I/Os, Avoid nephrotoxins (NSAIDs, judicious IV Contrast) 5. Will cont to closely follow.  Pearson Grippe MD [phone] pgr 01/11/2016, 12:04 PM

## 2016-01-11 NOTE — Progress Notes (Signed)
[facility] Note  Followed up with Lanae Boast today, after his significant grief processing yesterday.  Per pt, he feels much better physically, and appears to be in brighter spirits.  Provided pastoral presence and encouragement, sharing updates about [facility] move downstairs, etc) because Jayco enjoys keeping up with the center.  He has a particular gift/personal mission of keeping in touch with members of his support groups.  Also reminded him of ongoing [facility] chaplain availability if he would like to process his grief more before or after returning to support groups.  Plan to f/u when he is next at [facility], but please also page if needs arise/circumstances change.  Thank you.  [place], [place], [facility] M-F daytime pager [phone] [facility] 24/7 pager [phone] Voicemail [phone]

## 2016-01-11 NOTE — Progress Notes (Signed)
PROGRESS NOTE                                                                                                                                                                                                             Patient Demographics:    [name], is a 55 y.o. male, DOB - [birth year], UGQ:[id number]  Admit date - 01/09/2016   Admitting Physician Ivor Costa, MD  Outpatient Primary MD for the patient is Pcp Not In System  LOS - 2  Outpatient Specialists: oncologist and general surgeon in [place]  Chief Complaint  Patient presents with  . Abdominal Pain    recent gallbladder removal       Brief Narrative   55 year old male with history of multiple myeloma(On Revlimid, follows with oncologist in [facility]), hypertension, GERD, see Joellen Jersey states 5 (off dialysis since 2015), gout and depression who was admitted to hypoadrenal one week back and underwent cholecystectomy came to the ED with nausea, vomiting, abdominal pain and increased urinary frequency Patient found to be septic with fever of 102.4F, elevated lactic acid and neutropenia. 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 UA positive for UTI. CT of the abdomen and pelvis showed right hydronephrosis with perinephric and proximal periureteral stranding. Mild to moderate left perinephric stranding without hydronephrosis. Also commented on early versus partial small bowel obstruction. Admitted to stepdown unit.    Subjective:    Patient complains of mild epigastric discomfort . Denies dysuria, nausea or vomiting. Had 3 loose bowel movement overnight.   Assessment  & Plan :    Principal Problem:   Sepsis ([place]) Secondary to acute pyelonephritis gram-negative rods On blood culture. antibiotics narrowed to IV Rocephin. Pending sensitivity.  Sepsis  resolved.    Active Problems:   Multiple myeloma  [facility]) follows with oncologist in [facility]. Hold revlimid  Acute on CKD (chronic kidney disease), stage V (HCC) Progressive with creatinine of 7.3 today.Marland Kitchen Spoke with his nephrologist  nephrologist in [facility] ( Dr Cay Schillings, [phone])   Patient was on dialysis until 2013 and was being monitored thereafter. He has a functioning AV fistula with baseline creatinine all 4-5. She recommends that patient would need dialysis. Also that patient lives in [place] and if he requires regular dialysis it might be difficult for him  to take a bus to go to [facility] for dialysis 3 times a week. I have consulted renal.     Hypertension Stable. Continue Cardura.    Diabetes mellitus without complication ([place]) Monitor on sliding scale coverage.     Hyperkalemia Received IV insulin with D5, albuterol and Kayexalate. Now resolved.    Partial small bowel obstruction (HCC) Likely ileus due to acute sepsis, pyelonephritis. Resolved without intervention. Advance diet.    Status post recent cholecystectomy      Code Status : Full code  Family Communication  : None at bedside  Disposition Plan  : Transfer to telemetry.  Barriers For Discharge : Active symptoms  Consults  :  None  Procedures  :  CT abdomen and pelvis  DVT Prophylaxis  :  Heparin  Lab Results  Component Value Date   PLT 159 01/11/2016    Antibiotics  :    Anti-infectives    Start     Dose/Rate Route Frequency Ordered Stop   01/10/16 1600  cefTRIAXone (ROCEPHIN) 2 g in dextrose 5 % 50 mL IVPB     2 g 100 mL/hr over 30 Minutes Intravenous Every 24 hours 01/10/16 1443     01/10/16 1200  piperacillin-tazobactam (ZOSYN) IVPB 2.25 g  Status:  Discontinued     2.25 g 100 mL/hr over 30 Minutes Intravenous Every 6 hours 01/10/16 0741 01/10/16 1443   01/10/16 0300  piperacillin-tazobactam (ZOSYN) IVPB 3.375 g  Status:  Discontinued     3.375 g 12.5 mL/hr over 240 Minutes Intravenous Every 8 hours 01/10/16  0021 01/10/16 0741   01/09/16 1900  vancomycin (VANCOCIN) 1,500 mg in sodium chloride 0.9 % 500 mL IVPB     1,500 mg 250 mL/hr over 120 Minutes Intravenous  Once 01/09/16 1849 01/09/16 2203   01/09/16 1830  piperacillin-tazobactam (ZOSYN) IVPB 3.375 g     3.375 g 12.5 mL/hr over 240 Minutes Intravenous  Once 01/09/16 1827 01/09/16 1945        Objective:   Vitals:   01/11/16 0400 01/11/16 0500 01/11/16 0600 01/11/16 0800  BP: 128/60  127/69 (!) 134/58  Pulse: 76  76 81  Resp: 14  18 (!) 21  Temp:  98.1 F (36.7 C)  98.8 F (37.1 C)  TempSrc:  Oral  Oral  SpO2: 97%  97% 100%  Weight:  125.8 kg (277 lb 5.4 oz)    Height:        Wt Readings from Last 3 Encounters:  01/11/16 125.8 kg (277 lb 5.4 oz)     Intake/Output Summary (Last 24 hours) at 01/11/16 1114 Last data filed at 01/11/16 0845  Gross per 24 hour  Intake             2975 ml  Output              250 ml  Net             2725 ml     Physical Exam  Gen: Appears fatigued HEENT: Was mucosal, supple neck Chest: clear b/l, no added sounds CVS: N S1&S2, no murmurs, rubs or gallop GI: soft, nondistended, nontender, bowel sounds present, recent laparoscopy scar appears clean. Musculoskeletal: warm, no edema CNS: Alert and oriented    Data Review:    CBC  Recent Labs Lab 01/09/16 1654 01/10/16 0421 01/10/16 0500 01/11/16 0330  WBC 2.2* 12.4* DUP SEE [id number] 10.7*  HGB 10.7* 9.4* DUP SEE [id number] 9.2*  HCT 32.5* 28.0* DUP SEE  [id number] 27.6*  PLT 226 201 DUP SEE [id number] 159  MCV 98.8 97.9 DUP SEE [id number] 98.6  MCH 32.5 32.9 DUP SEE [id number] 32.9  MCHC 32.9 33.6 DUP SEE [id number] 33.3  RDW 17.0* 17.4* DUP SEE [id number] 17.7*  LYMPHSABS  --  0.2* PENDING 0.2*  MONOABS  --  0.5 PENDING 0.2  EOSABS  --  0.0 PENDING 0.0  BASOSABS  --  0.0 PENDING 0.0    Chemistries   Recent Labs Lab 01/09/16 1654 01/10/16 0133 01/10/16 0421 01/10/16 0955 01/11/16 0330  NA 137 133* 137 137 135  K 5.6* 5.8* 6.0* 5.6* 4.6  CL  103 104 107 108 105  CO2 26 23 23 23 22   GLUCOSE 100* 156* 106* 88 120*  BUN 37* 38* 40* 44* 50*  CREATININE 5.62* 6.10* 6.34* 6.79* 7.36*  CALCIUM 8.7* 7.8* 8.1* 7.9* 7.5*  AST 23 42*  --   --   --   ALT 23 32  --   --   --   ALKPHOS 91 66  --   --   --   BILITOT 0.7 1.3*  --   --   --    ------------------------------------------------------------------------------------------------------------------ No results for input(s): CHOL, HDL, LDLCALC, TRIG, CHOLHDL, LDLDIRECT in the last 72 hours.  Lab Results  Component Value Date   HGBA1C 5.2 01/10/2016   ------------------------------------------------------------------------------------------------------------------ No results for input(s): TSH, T4TOTAL, T3FREE, THYROIDAB in the last 72 hours.  Invalid input(s): FREET3 ------------------------------------------------------------------------------------------------------------------ No results for input(s): VITAMINB12, FOLATE, FERRITIN, TIBC, IRON, RETICCTPCT in the last 72 hours.  Coagulation profile  Recent Labs Lab 01/10/16 0133  INR 1.19    No results for input(s): DDIMER in the last 72 hours.  Cardiac Enzymes No results for input(s): CKMB, TROPONINI, MYOGLOBIN in the last 168 hours.  Invalid input(s): CK ------------------------------------------------------------------------------------------------------------------ No results found for: BNP  Inpatient Medications  Scheduled Meds: . amLODipine  2.5 mg Oral Daily  . aspirin  325 mg Oral QHS  . b complex-vitamin c-folic acid  1 tablet Oral Daily  . calcitRIOL  0.5 mcg Oral Once per day on Mon Tue Wed Thu Fri  . [START ON 01/15/2016] calcitRIOL  0.5 mcg Oral 2 times per day on Sun Sat  . cefTRIAXone (ROCEPHIN)  IV  2 g Intravenous Q24H  . doxazosin  4 mg Oral QHS  . escitalopram  10 mg Oral QHS  . febuxostat  40 mg Oral Daily  . fluticasone  1 spray Each Nare Daily  . loratadine  10 mg Oral Daily  .  pantoprazole  40 mg Oral Daily  . pneumococcal 23 valent vaccine  0.5 mL Intramuscular Tomorrow-1000  . senna  1 tablet Oral QPM  . sodium bicarbonate  1,300 mg Oral QID  . sodium chloride flush  3 mL Intravenous Q12H   Continuous Infusions:   PRN Meds:.acetaminophen **OR** acetaminophen, hydrALAZINE, HYDROcodone-acetaminophen, ondansetron (ZOFRAN) IV  Micro Results Recent Results (from the past 240 hour(s))  Culture, blood (routine x 2)     Status: None (Preliminary result)   Collection Time: 01/09/16  7:05 PM  Result Value Ref Range Status   Specimen Description BLOOD RIGHT ANTECUBITAL  Final   Special Requests BOTTLES DRAWN AEROBIC AND ANAEROBIC 5CC  Final   Culture  Setup Time   Final    GRAM NEGATIVE RODS IN BOTH AEROBIC AND ANAEROBIC BOTTLES CRITICAL RESULT CALLED TO, READ BACK BY AND VERIFIED WITH: C SHADE,PHARMD AT 1010 01/10/16 BY Ronnie Derby Performed at [facility]  Culture GRAM NEGATIVE RODS  Final   Report Status PENDING  Incomplete  Blood Culture ID Panel (Reflexed)     Status: Abnormal   Collection Time: 01/09/16  7:05 PM  Result Value Ref Range Status   Enterococcus species NOT DETECTED NOT DETECTED Final   Vancomycin resistance NOT DETECTED NOT DETECTED Final   Listeria monocytogenes NOT DETECTED NOT DETECTED Final   Staphylococcus species NOT DETECTED NOT DETECTED Final   Staphylococcus aureus NOT DETECTED NOT DETECTED Final   Methicillin resistance NOT DETECTED NOT DETECTED Final   Streptococcus species NOT DETECTED NOT DETECTED Final   Streptococcus agalactiae NOT DETECTED NOT DETECTED Final   Streptococcus pneumoniae NOT DETECTED NOT DETECTED Final   Streptococcus pyogenes NOT DETECTED NOT DETECTED Final   Acinetobacter baumannii NOT DETECTED NOT DETECTED Final   Enterobacteriaceae species DETECTED (A) NOT DETECTED Final    Comment: CRITICAL RESULT CALLED TO, READ BACK BY AND VERIFIED WITH: C. Shade Pharm.D. 10:10 01/10/16 ([email or web address])    Enterobacter  cloacae complex NOT DETECTED NOT DETECTED Final   Escherichia coli NOT DETECTED NOT DETECTED Final   Klebsiella oxytoca NOT DETECTED NOT DETECTED Final   Klebsiella pneumoniae DETECTED (A) NOT DETECTED Final    Comment: CRITICAL RESULT CALLED TO, READ BACK BY AND VERIFIED WITH: C. Shade Pharm.D. 10:10 01/10/16  ([email or web address])    Proteus species NOT DETECTED NOT DETECTED Final   Serratia marcescens NOT DETECTED NOT DETECTED Final   Carbapenem resistance NOT DETECTED NOT DETECTED Final   Haemophilus influenzae NOT DETECTED NOT DETECTED Final   Neisseria meningitidis NOT DETECTED NOT DETECTED Final   Pseudomonas aeruginosa NOT DETECTED NOT DETECTED Final   Candida albicans NOT DETECTED NOT DETECTED Final   Candida glabrata NOT DETECTED NOT DETECTED Final   Candida krusei NOT DETECTED NOT DETECTED Final   Candida parapsilosis NOT DETECTED NOT DETECTED Final   Candida tropicalis NOT DETECTED NOT DETECTED Final  Culture, blood (routine x 2)     Status: None (Preliminary result)   Collection Time: 01/09/16  7:06 PM  Result Value Ref Range Status   Specimen Description BLOOD RIGHT HAND  Final   Special Requests IN PEDIATRIC BOTTLE 2CC  Final   Culture  Setup Time   Final    GRAM NEGATIVE RODS IN PEDIATRIC BOTTLE CRITICAL RESULT CALLED TO, READ BACK BY AND VERIFIED WITH: T SHADE,PHARMD AT 1010 01/10/16 BY Ronnie Derby Performed at [facility]  Final   Report Status PENDING  Incomplete  MRSA PCR Screening     Status: None   Collection Time: 01/10/16 12:33 AM  Result Value Ref Range Status   MRSA by PCR NEGATIVE NEGATIVE Final    Comment:        The GeneXpert MRSA Assay (FDA approved for NASAL specimens only), is one component of a comprehensive MRSA colonization surveillance program. It is not intended to diagnose MRSA infection nor to guide or monitor treatment for MRSA infections.     Radiology Reports Ct Abdomen Pelvis Wo Contrast  Result Date:  01/09/2016 CLINICAL DATA:  55 year old male with right side abdominal pain after recent cholecystectomy. Fever. Initial encounter. Stage IV multiple myeloma. EXAM: CT ABDOMEN AND PELVIS WITHOUT CONTRAST TECHNIQUE: Multidetector CT imaging of the abdomen and pelvis was performed following the standard protocol without IV contrast. COMPARISON:  [facility] CT Abdomen and Pelvis 10/05/2008. FINDINGS: Respiratory motion artifact at the lung bases which appear clear. Cardiomegaly is new since 2010. No pericardial  or pleural effusion. The visible spine appears intact. Chronic severe expansile bony lesions about the left acetabulum and affecting the bilateral pubic rami. These appears somewhat more healed/ sclerotic than on the 2010 exam. No new osseous lesion identified. No pelvic free fluid. Fairly decompressed rectum. Redundant sigmoid colon tracking into the right lower quadrant and mildly gas distended there, but otherwise negative. Mild diverticulosis of the left colon which is otherwise negative aside from retained stool. Mild gas and stool distension of the transverse colon. Fluid in the right colon. Negative retrocecal appendix. Negative terminal ileum. Multiple mildly dilated contrast containing proximal small bowel loops up to 3 cm diameter. There is however relatively gradual transition to decompressed distal small bowel loops which occurs in the left lower quadrant (series 2, image 66). No abdominal free air or free fluid. Stomach mildly distended with contrast. Negative duodenum. Gallbladder is now surgically absent. Surgical clips in the gallbladder fossa plus a small volume of intermediate density material. No biliary ductal enlargement identified. Negative noncontrast liver otherwise. Negative noncontrast spleen, pancreas and adrenal glands. However, both kidneys appear abnormal. There is right moderate to severe hydronephrosis with right para renal stranding. The proximal right ureter  also appears enlarged and indistinct with periureteral stranding. However, the right ureter quickly tapers and no obstructing etiology is identified. There is mild to moderate left para renal stranding without hydronephrosis or hydroureter. No abnormality of the left ureter. No lymphadenopathy. IMPRESSION: 1. Right hydronephrosis with perinephric and proximal periureteral inflammatory stranding, but no obstructing etiology identified. Mild to moderate left perinephric stranding without hydronephrosis. These findings are nonspecific, and an infected obstructed right kidney is difficult to exclude in this setting. Query abnormal urinalysis. 2. Surgically absent gallbladder with minimal postoperative changes in the gallbladder fossa and no adverse features. 3. Partial or early small bowel obstruction, but without abrupt transition point. There is a gradual transition to normal small bowel loops in the left lower quadrant. 4. Cardiomegaly, new since 2010. 5. Chronic expansile lytic lesions of the left pelvis and pubic rami compatible with stated history of multiple myeloma. Electronically Signed   By: Genevie Ann M.D.   On: 01/09/2016 20:54    Time Spent in minutes  35   Louellen Molder M.D on 01/11/2016 at 11:14 AM  Between 7am to 7pm - Pager - [phone]  After 7pm go to [email or web address] - password [facility]  [facility] -  Office  [phone]

## 2016-01-11 NOTE — Progress Notes (Signed)
[place] Note  Visited [place] in [facility] ICU/stepdown per pt request.  He is an active participant in support groups and programming at [facility].  Liliana tends to be upbeat and to keep the tone light, often offering encouragement to other support group members. During this encounter he shared more vulnerably, using the opportunity to process his grief about recent losses in the groups.  Provided pastoral presence, reflective listening, and normalization of feelings, supporting his grief work and healing process.  Also consulted with [facility] chaplains Matt Stalnaker/[facility] and [place] regarding spiritual care for pt.  Plan to f/u tomorrow per pt request, but please also page if needs arise/circumstances change.  Thank you.  [place], [place], [facility] M-F daytime pager [phone] [facility] 24/7 pager [phone] Voicemail [phone]

## 2016-01-12 LAB — URINE CULTURE

## 2016-01-12 LAB — BASIC METABOLIC PANEL
Anion gap: 10 (ref 5–15)
BUN: 54 mg/dL — ABNORMAL HIGH (ref 6–20)
CHLORIDE: 105 mmol/L (ref 101–111)
CO2: 20 mmol/L — AB (ref 22–32)
CREATININE: 7.32 mg/dL — AB (ref 0.61–1.24)
Calcium: 7.8 mg/dL — ABNORMAL LOW (ref 8.9–10.3)
GFR calc non Af Amer: 7 mL/min — ABNORMAL LOW (ref >60)
GFR, EST AFRICAN AMERICAN: 9 mL/min — AB (ref >60)
Glucose, Bld: 92 mg/dL (ref 65–99)
Potassium: 4.5 mmol/L (ref 3.5–5.1)
Sodium: 135 mmol/L (ref 135–145)

## 2016-01-12 LAB — CULTURE, BLOOD (ROUTINE X 2)

## 2016-01-12 LAB — GLUCOSE, CAPILLARY: Glucose-Capillary: 91 mg/dL (ref 65–99)

## 2016-01-12 NOTE — Discharge Summary (Signed)
Physician Discharge Summary  [name] VZD:[id number] DOB: [birth year] DOA: 01/09/2016  PCP: Pcp Not In System  Admit date: 01/09/2016 Discharge date: 01/12/2016  Recommendations for Outpatient Follow-up:  1. Pt will need to follow up with PCP in 1- 2 weeks post discharge 2. Please obtain BMP to evaluate electrolytes and kidney function 3. Please also check CBC to evaluate Hg and Hct levels 4. Patient advised to continue taking ciprofloxacin for 5 more days upon discharge to complete therapy for Klebsiella UTI and Klebsiella bacteremia  Discharge Diagnoses:  Principal Problem:   Pyelonephritis Active Problems:   Sepsis ([place])   Multiple myeloma ([place])   Hypertension   Diabetes mellitus without complication ([place])   CKD (chronic kidney disease), stage V (HCC)   Hyperkalemia   Partial small bowel obstruction (HCC)   Hydronephrosis of right kidney  Discharge Condition: Stable  Diet recommendation: Heart healthy diet discussed in details   Brief Narrative   55 year old male with history of multiple myeloma(On Revlimid, follows with oncologist in [facility]), hypertension, GERD, came to the ED with nausea, vomiting, abdominal pain and increased urinary frequency.  Patient found to be septic with fever of 102.29F, elevated lactic acid and neutropenia. 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  UA positive for UTI. CT of the abdomen and pelvis showed right hydronephrosis with perinephric and proximal periureteral stranding. Mild to moderate left perinephric stranding without hydronephrosis. Also commented on early versus partial small bowel obstruction.   Subjective:   Patient reports feeling better, insisting on going home today   Assessment  & Plan :    Principal Problem:   Sepsis ([place]) Secondary to acute pyelonephritis, Klebsiella UTI, Klebsiella bacteremia - Blood cultures and urine cultures reviewed, positive for  Klebsiella - Patient initially on Rocephin, tolerating oral intake well, transition to oral ciprofloxacin today with plan to complete therapy for 5 more days upon discharge - Patient insisting on going home today and nephrology team has cleared patient from their standpoint 8/17 - I have explained to patient that he is still having low-grade fevers of 99.9 Fahrenheit - He was advised to follow-up with primary care physician within next week or sooner if he starts spiking fevers greater than 101F  Active Problems:   Multiple myeloma ([place]) - follows with oncologist in [facility]. Hold revlimid   Acute on CKD (chronic kidney disease), stage V (HCC) - Progressive with creatinine of 7.3 today.Marland Kitchen Spoke with his nephrologist  nephrologist in [facility] ( Dr Cay Schillings, [phone])  - Patient was on dialysis until 2013 and was being monitored thereafter. He has a functioning AV fistula with baseline creatinine all 4-5. She recommends that patient would need dialysis. Also that patient lives in [place] and if he requires regular dialysis it might be difficult for him to take a bus to go to [facility] for dialysis 3 times a week. - Nephrology cleared patient for discharge 8/17    Hypertension - Continue home medical regimen    Diabetes mellitus without complication ([place]) - Continue home medical regimen    Hyperkalemia - Within normal limits    Partial small bowel obstruction (HCC) - Thought to be ileus related to sepsis - Patient wanted his diet to be changed to regular, will respect wishes - Patient tolerated diet well, wants to be discharged home - Okay to discharge with close monitoring as outlined above    Status post recent cholecystectomy    Code  Status : Full code  Family Communication  : None at bedside  Consults  :  Nephrology     Procedures/Studies: Ct Abdomen Pelvis Wo Contrast  Result Date: 01/09/2016 CLINICAL DATA:  55 year old male with right  side abdominal pain after recent cholecystectomy. Fever. Initial encounter. Stage IV multiple myeloma. EXAM: CT ABDOMEN AND PELVIS WITHOUT CONTRAST TECHNIQUE: Multidetector CT imaging of the abdomen and pelvis was performed following the standard protocol without IV contrast. COMPARISON:  [facility] CT Abdomen and Pelvis 10/05/2008. FINDINGS: Respiratory motion artifact at the lung bases which appear clear. Cardiomegaly is new since 2010. No pericardial or pleural effusion. The visible spine appears intact. Chronic severe expansile bony lesions about the left acetabulum and affecting the bilateral pubic rami. These appears somewhat more healed/ sclerotic than on the 2010 exam. No new osseous lesion identified. No pelvic free fluid. Fairly decompressed rectum. Redundant sigmoid colon tracking into the right lower quadrant and mildly gas distended there, but otherwise negative. Mild diverticulosis of the left colon which is otherwise negative aside from retained stool. Mild gas and stool distension of the transverse colon. Fluid in the right colon. Negative retrocecal appendix. Negative terminal ileum. Multiple mildly dilated contrast containing proximal small bowel loops up to 3 cm diameter. There is however relatively gradual transition to decompressed distal small bowel loops which occurs in the left lower quadrant (series 2, image 66). No abdominal free air or free fluid. Stomach mildly distended with contrast. Negative duodenum. Gallbladder is now surgically absent. Surgical clips in the gallbladder fossa plus a small volume of intermediate density material. No biliary ductal enlargement identified. Negative noncontrast liver otherwise. Negative noncontrast spleen, pancreas and adrenal glands. However, both kidneys appear abnormal. There is right moderate to severe hydronephrosis with right para renal stranding. The proximal right ureter also appears enlarged and indistinct with periureteral  stranding. However, the right ureter quickly tapers and no obstructing etiology is identified. There is mild to moderate left para renal stranding without hydronephrosis or hydroureter. No abnormality of the left ureter. No lymphadenopathy. IMPRESSION: 1. Right hydronephrosis with perinephric and proximal periureteral inflammatory stranding, but no obstructing etiology identified. Mild to moderate left perinephric stranding without hydronephrosis. These findings are nonspecific, and an infected obstructed right kidney is difficult to exclude in this setting. Query abnormal urinalysis. 2. Surgically absent gallbladder with minimal postoperative changes in the gallbladder fossa and no adverse features. 3. Partial or early small bowel obstruction, but without abrupt transition point. There is a gradual transition to normal small bowel loops in the left lower quadrant. 4. Cardiomegaly, new since 2010. 5. Chronic expansile lytic lesions of the left pelvis and pubic rami compatible with stated history of multiple myeloma. Electronically Signed   By: Genevie Ann M.D.   On: 01/09/2016 20:54   Discharge Exam: Vitals:   01/11/16 2241 01/12/16 0632  BP: (!) 141/64 (!) 143/74  Pulse: 82 84  Resp: 17 16  Temp: 99.4 F (37.4 C) 98.6 F (37 C)   Vitals:   01/11/16 1200 01/11/16 1430 01/11/16 2241 01/12/16 0632  BP:  (!) 158/67 (!) 141/64 (!) 143/74  Pulse:  89 82 84  Resp: (!) 23  17 16   Temp:  98.8 F (37.1 C) 99.4 F (37.4 C) 98.6 F (37 C)  TempSrc:  Oral Oral Oral  SpO2:  100% 99% 99%  Weight:  125.2 kg (276 lb)    Height:  6' 1"  (1.854 m)      General: Pt is alert,  follows commands appropriately, not in acute distress Cardiovascular: Regular rate and rhythm, S1/S2 +, no rubs, no gallops Respiratory: Clear to auscultation bilaterally, no wheezing, no crackles, no rhonchi Abdominal: Soft, non tender, non distended, bowel sounds +, no guarding  Discharge Instructions    Medication List    STOP  taking these medications   cephALEXin 500 MG capsule Commonly known as:  KEFLEX     TAKE these medications   amLODipine 2.5 MG tablet Commonly known as:  NORVASC Take 2.5 mg by mouth daily.   calcitRIOL 0.5 MCG capsule Commonly known as:  ROCALTROL Take 0.5 mcg by mouth as directed. Take 0.53mg daily except on Saturday and Sunday take 0.563m twice daily   cetirizine 10 MG tablet Commonly known as:  ZYRTEC Take 10 mg by mouth daily.   ciprofloxacin 500 MG tablet Commonly known as:  CIPRO Take 1 tablet (500 mg total) by mouth 2 (two) times daily.   Co-Enzyme Q-10 30 MG Caps Take 30 mg by mouth daily.   doxazosin 4 MG tablet Commonly known as:  CARDURA Take 4 mg by mouth at bedtime.   escitalopram 10 MG tablet Commonly known as:  LEXAPRO Take 10 mg by mouth at bedtime.   febuxostat 40 MG tablet Commonly known as:  ULORIC Take 40 mg by mouth daily.   fluticasone 50 MCG/ACT nasal spray Commonly known as:  FLONASE Place 1 spray into the nose daily as needed for allergies.   GOODSENSE ASPIRIN 325 MG tablet Generic drug:  aspirin Take 325 mg by mouth at bedtime.   HYDROcodone-acetaminophen 5-325 MG tablet Commonly known as:  NORCO/VICODIN Take 1-2 tablets by mouth every 4 (four) hours as needed for pain.   magnesium oxide 400 MG tablet Commonly known as:  MAG-OX Take 1 tablet (400 mg total) by mouth 2 (two) times daily.   NEPHRO-VITE PO Take 1 tablet by mouth daily.   omeprazole 20 MG capsule Commonly known as:  PRILOSEC Take 20 mg by mouth daily.   ondansetron 4 MG disintegrating tablet Commonly known as:  ZOFRAN ODT Take 1 tablet (4 mg total) by mouth every 8 (eight) hours as needed for nausea or vomiting.   promethazine 25 MG tablet Commonly known as:  PHENERGAN Take 1 tablet (25 mg total) by mouth every 6 (six) hours as needed for nausea or vomiting.   REVLIMID 5 MG capsule Generic drug:  lenalidomide Take 5 mg by mouth at bedtime. On for x21 days  then off for 7 days   senna 8.6 MG tablet Commonly known as:  SENOKOT Take 1 tablet by mouth every evening.   sodium bicarbonate 650 MG tablet Take 2 tablets (1,300 mg total) by mouth 4 (four) times daily.          The results of significant diagnostics from this hospitalization (including imaging, microbiology, ancillary and laboratory) are listed below for reference.     Microbiology: Recent Results (from the past 240 hour(s))  Urine culture     Status: Abnormal   Collection Time: 01/09/16  3:59 PM  Result Value Ref Range Status   Specimen Description URINE, CLEAN CATCH  Final   Special Requests NONE  Final   Culture >=100,000 COLONIES/mL KLEBSIELLA PNEUMONIAE (A)  Final   Report Status 01/12/2016 FINAL  Final   Organism ID, Bacteria KLEBSIELLA PNEUMONIAE (A)  Final      Susceptibility   Klebsiella pneumoniae - MIC*    AMPICILLIN >=32 RESISTANT Resistant     CEFAZOLIN <=4 SENSITIVE  Sensitive     CEFTRIAXONE <=1 SENSITIVE Sensitive     CIPROFLOXACIN <=0.25 SENSITIVE Sensitive     GENTAMICIN <=1 SENSITIVE Sensitive     IMIPENEM <=0.25 SENSITIVE Sensitive     NITROFURANTOIN 128 RESISTANT Resistant     TRIMETH/SULFA <=20 SENSITIVE Sensitive     AMPICILLIN/SULBACTAM >=32 RESISTANT Resistant     PIP/TAZO 8 SENSITIVE Sensitive     Extended ESBL NEGATIVE Sensitive     * >=100,000 COLONIES/mL KLEBSIELLA PNEUMONIAE  Culture, blood (routine x 2)     Status: Abnormal   Collection Time: 01/09/16  7:05 PM  Result Value Ref Range Status   Specimen Description BLOOD RIGHT ANTECUBITAL  Final   Special Requests BOTTLES DRAWN AEROBIC AND ANAEROBIC 5CC  Final   Culture  Setup Time   Final    GRAM NEGATIVE RODS IN BOTH AEROBIC AND ANAEROBIC BOTTLES CRITICAL RESULT CALLED TO, READ BACK BY AND VERIFIED WITH: C SHADE,PHARMD AT 1010 01/10/16 BY Ronnie Derby Performed at [facility] (A)  Final   Report Status 01/12/2016 FINAL  Final   Organism ID,  Bacteria KLEBSIELLA PNEUMONIAE  Final      Susceptibility   Klebsiella pneumoniae - MIC*    AMPICILLIN >=32 RESISTANT Resistant     CEFAZOLIN <=4 SENSITIVE Sensitive     CEFEPIME <=1 SENSITIVE Sensitive     CEFTAZIDIME <=1 SENSITIVE Sensitive     CEFTRIAXONE <=1 SENSITIVE Sensitive     CIPROFLOXACIN <=0.25 SENSITIVE Sensitive     GENTAMICIN <=1 SENSITIVE Sensitive     IMIPENEM 0.5 SENSITIVE Sensitive     TRIMETH/SULFA <=20 SENSITIVE Sensitive     AMPICILLIN/SULBACTAM >=32 RESISTANT Resistant     PIP/TAZO 8 SENSITIVE Sensitive     Extended ESBL NEGATIVE Sensitive     * KLEBSIELLA PNEUMONIAE  Blood Culture ID Panel (Reflexed)     Status: Abnormal   Collection Time: 01/09/16  7:05 PM  Result Value Ref Range Status   Enterococcus species NOT DETECTED NOT DETECTED Final   Vancomycin resistance NOT DETECTED NOT DETECTED Final   Listeria monocytogenes NOT DETECTED NOT DETECTED Final   Staphylococcus species NOT DETECTED NOT DETECTED Final   Staphylococcus aureus NOT DETECTED NOT DETECTED Final   Methicillin resistance NOT DETECTED NOT DETECTED Final   Streptococcus species NOT DETECTED NOT DETECTED Final   Streptococcus agalactiae NOT DETECTED NOT DETECTED Final   Streptococcus pneumoniae NOT DETECTED NOT DETECTED Final   Streptococcus pyogenes NOT DETECTED NOT DETECTED Final   Acinetobacter baumannii NOT DETECTED NOT DETECTED Final   Enterobacteriaceae species DETECTED (A) NOT DETECTED Final    Comment: CRITICAL RESULT CALLED TO, READ BACK BY AND VERIFIED WITH: C. Shade Pharm.D. 10:10 01/10/16 ([email or web address])    Enterobacter cloacae complex NOT DETECTED NOT DETECTED Final   Escherichia coli NOT DETECTED NOT DETECTED Final   Klebsiella oxytoca NOT DETECTED NOT DETECTED Final   Klebsiella pneumoniae DETECTED (A) NOT DETECTED Final    Comment: CRITICAL RESULT CALLED TO, READ BACK BY AND VERIFIED WITH: C. Shade Pharm.D. 10:10 01/10/16  ([email or web address])    Proteus species NOT DETECTED NOT DETECTED  Final   Serratia marcescens NOT DETECTED NOT DETECTED Final   Carbapenem resistance NOT DETECTED NOT DETECTED Final   Haemophilus influenzae NOT DETECTED NOT DETECTED Final   Neisseria meningitidis NOT DETECTED NOT DETECTED Final   Pseudomonas aeruginosa NOT DETECTED NOT DETECTED Final   Candida albicans NOT DETECTED NOT DETECTED Final   Candida glabrata NOT DETECTED NOT DETECTED  Final   Candida krusei NOT DETECTED NOT DETECTED Final   Candida parapsilosis NOT DETECTED NOT DETECTED Final   Candida tropicalis NOT DETECTED NOT DETECTED Final  Culture, blood (routine x 2)     Status: Abnormal   Collection Time: 01/09/16  7:06 PM  Result Value Ref Range Status   Specimen Description BLOOD RIGHT HAND  Final   Special Requests IN PEDIATRIC BOTTLE 2CC  Final   Culture  Setup Time   Final    GRAM NEGATIVE RODS IN PEDIATRIC BOTTLE CRITICAL RESULT CALLED TO, READ BACK BY AND VERIFIED WITH: T SHADE,PHARMD AT 1010 01/10/16 BY M WILSON    Culture (A)  Final    KLEBSIELLA PNEUMONIAE SUSCEPTIBILITIES PERFORMED ON PREVIOUS CULTURE WITHIN THE LAST 5 DAYS. Performed at [facility]    Report Status 01/12/2016 FINAL  Final  MRSA PCR Screening     Status: None   Collection Time: 01/10/16 12:33 AM  Result Value Ref Range Status   MRSA by PCR NEGATIVE NEGATIVE Final    Comment:        The GeneXpert MRSA Assay (FDA approved for NASAL specimens only), is one component of a comprehensive MRSA colonization surveillance program. It is not intended to diagnose MRSA infection nor to guide or monitor treatment for MRSA infections.      Labs: Basic Metabolic Panel:  Recent Labs Lab 01/10/16 0133 01/10/16 0421 01/10/16 0955 01/11/16 0330 01/12/16 0542  NA 133* 137 137 135 135  K 5.8* 6.0* 5.6* 4.6 4.5  CL 104 107 108 105 105  CO2 23 23 23 22  20*  GLUCOSE 156* 106* 88 120* 92  BUN 38* 40* 44* 50* 54*  CREATININE 6.10* 6.34* 6.79* 7.36* 7.32*  CALCIUM 7.8* 8.1* 7.9* 7.5* 7.8*    Liver Function Tests:  Recent Labs Lab 01/09/16 1654 01/10/16 0133  AST 23 42*  ALT 23 32  ALKPHOS 91 66  BILITOT 0.7 1.3*  PROT 7.7 6.8  ALBUMIN 3.4* 3.0*    Recent Labs Lab 01/09/16 1654  LIPASE 53*   No results for input(s): AMMONIA in the last 168 hours. CBC:  Recent Labs Lab 01/09/16 1654 01/10/16 0421 01/10/16 0500 01/11/16 0330  WBC 2.2* 12.4* DUP SEE [id number] 10.7*  NEUTROABS  --  11.7* PENDING 10.3*  HGB 10.7* 9.4* DUP SEE [id number] 9.2*  HCT 32.5* 28.0* DUP SEE [id number] 27.6*  MCV 98.8 97.9 DUP SEE [id number] 98.6  PLT 226 201 DUP SEE [id number] 159    CBG:  Recent Labs Lab 01/10/16 0743 01/10/16 1150 01/10/16 1548 01/11/16 0732 01/12/16 0747  GLUCAP 110* 107* 109* 118* 91     SIGNED: Time coordinating discharge: 30 minutes  MAGICK-MYERS, ISKRA, MD  [facility] 01/12/2016, 11:38 AM Pager [phone]  If 7PM-7AM, please contact night-coverage [email or web address] Password [email or web address]

## 2016-01-12 NOTE — Progress Notes (Signed)
Admit: 01/09/2016 LOS: 3  44M w/ AoCKD5 in setting of R pyelonephritis +HN; GNR bacteremia; hx/o myeloma  Subjective:  Feels well, walking in halls  Afebrile. Stable BP   08/15 0701 - 08/16 0700 In: 615 [P.O.:240; I.V.:375] Out: 725 [Urine:725]  Filed Weights   01/09/16 1832 01/11/16 0500 01/11/16 1430  Weight: 123.4 kg (272 lb) 125.8 kg (277 lb 5.4 oz) 125.2 kg (276 lb)    Scheduled Meds: . amLODipine  2.5 mg Oral Daily  . aspirin  325 mg Oral QHS  . b complex-vitamin c-folic acid  1 tablet Oral Daily  . calcitRIOL  0.5 mcg Oral Once per day on Mon Tue Wed Thu Fri  . [START ON 01/15/2016] calcitRIOL  0.5 mcg Oral 2 times per day on Sun Sat  . cefTRIAXone (ROCEPHIN)  IV  2 g Intravenous Q24H  . doxazosin  4 mg Oral QHS  . escitalopram  10 mg Oral QHS  . febuxostat  40 mg Oral Daily  . fluticasone  1 spray Each Nare Daily  . loratadine  10 mg Oral Daily  . pantoprazole  40 mg Oral Daily  . pneumococcal 23 valent vaccine  0.5 mL Intramuscular Tomorrow-1000  . senna  1 tablet Oral QPM  . sodium bicarbonate  1,300 mg Oral QID  . sodium chloride flush  3 mL Intravenous Q12H   Continuous Infusions:  PRN Meds:.acetaminophen **OR** acetaminophen, hydrALAZINE, HYDROcodone-acetaminophen, ondansetron (ZOFRAN) IV  Current Labs: reviewed    Physical Exam:  Blood pressure (!) 143/77, pulse 84, temperature 98.8 F (37.1 C), temperature source Oral, resp. rate 18, height 6\' 1"  (1.854 m), weight 125.2 kg (276 lb), SpO2 100 %. GEN: NAD ENT: NCAT EYES: EOMI CV: RRR, no rub PULM: CTAB ABD: s/nt/nd SKIN: no rashes/lesions EXT:no LEE NEURO: nonfocal, no asterixus LUE AVF +B/T, mature  A 1. AoCKD5, BL SCr ~5?; sees Dr Cay Schillings, pager#[phone] -- [facility] 1. CT A/P 8/13 with R HN and suggestion of Pyelonephrotis 2. Urology commented that stone unlikely cause 3. Mature LUE AVF 4. Not uremic, metabolically stable, no immediate HD needs 2. GNR Bacteremia + R  pyelonephritis on Ceftriaxone; per [facility] 3. MM clinically sounds to be in remission on Revlimid 4. 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 Hyperkalemia, resolved 6. HTN, stable  Plan 1. I see no immediate need for dialysis and possibly with improvement in his other issues his renal function will return towards previous baseline 2. He would like to hold off on HD and see how he does, which I think is okay. 3. Cont calcitriol, NaHCO3 4. Daily weights, Daily Renal Panel, Strict I/Os, Avoid nephrotoxins (NSAIDs, judicious IV Contrast) 5. Will cont to closely follow.   Pearson Grippe MD 01/12/2016, 3:12 PM   Recent Labs Lab 01/10/16 0955 01/11/16 0330 01/12/16 0542  NA 137 135 135  K 5.6* 4.6 4.5  CL 108 105 105  CO2 23 22 20*  GLUCOSE 88 120* 92  BUN 44* 50* 54*  CREATININE 6.79* 7.36* 7.32*  CALCIUM 7.9* 7.5* 7.8*    Recent Labs Lab 01/10/16 0421 01/10/16 0500 01/11/16 0330  WBC 12.4* DUP SEE [id number] 10.7*  NEUTROABS 11.7* PENDING 10.3*  HGB 9.4* DUP SEE [id number] 9.2*  HCT 28.0* DUP SEE [id number] 27.6*  MCV 97.9 DUP SEE [id number] 98.6  PLT 201 DUP SEE [id number] 159

## 2016-01-12 NOTE — Progress Notes (Signed)
Physical Therapy Treatment Patient Details Name: GUNNER IODICE MRN: [id number] DOB: [birth year] Today's Date: 01/12/2016    History of Present Illness 55 y.o. male with medical history significant of multiple myeloma (on Revlimid), hypertension, GERD, gout, depression, CKD-V,  S/p of cholecystectomy last week, who presents with nausea, vomiting, abdominal pain and increased urinary frequency; diagnosed with Pyelonephritis and sepsis    PT Comments    Assisted pt OOB to amb a greater distance then to bathroom.    Follow Up Recommendations  No PT follow up     Equipment Recommendations  None recommended by PT    Recommendations for Other Services       Precautions / Restrictions Precautions Precautions: Fall Restrictions Weight Bearing Restrictions: No    Mobility  Bed Mobility Overal bed mobility: Modified Independent             General bed mobility comments: HOB raised and increased time   Transfers Overall transfer level: Needs assistance Equipment used: None   Sit to Stand: Supervision         General transfer comment: one VC on safety with turns and backward steps to toilet  Ambulation/Gait Ambulation/Gait assistance: Supervision;Min guard Ambulation Distance (Feet): 65 Feet Assistive device: None (pushing IV pole) Gait Pattern/deviations: Step-to pattern;Step-through pattern Gait velocity: decreased   General Gait Details: pt usually uses his quad cane but preferred to push IV pole for "more stability".  Tolerated increased distance.  Noted decreased stance time L LE.     Stairs            Wheelchair Mobility    Modified Rankin (Stroke Patients Only)       Balance                                    Cognition Arousal/Alertness: Awake/alert Behavior During Therapy: WFL for tasks assessed/performed Overall Cognitive Status: Within Functional Limits for tasks assessed                      Exercises       General Comments        Pertinent Vitals/Pain Pain Assessment: No/denies pain    Home Living                      Prior Function            PT Goals (current goals can now be found in the care plan section) Progress towards PT goals: Progressing toward goals    Frequency  Min 3X/week    PT Plan Current plan remains appropriate    Co-evaluation             End of Session Equipment Utilized During Treatment: Gait belt Activity Tolerance: Patient limited by fatigue Patient left: Other (comment) (in bathroom with instructions to pull light)     Time: 8592-9244 PT Time Calculation (min) (ACUTE ONLY): 18 min  Charges:  $Gait Training: 8-22 mins                    G Codes:      Rica Koyanagi  PTA [facility]  Acute  Rehab Pager      [phone]

## 2016-01-13 LAB — GLUCOSE, CAPILLARY: Glucose-Capillary: 94 mg/dL (ref 65–99)

## 2016-01-13 LAB — BASIC METABOLIC PANEL
ANION GAP: 8 (ref 5–15)
BUN: 58 mg/dL — ABNORMAL HIGH (ref 6–20)
CALCIUM: 8.1 mg/dL — AB (ref 8.9–10.3)
CO2: 24 mmol/L (ref 22–32)
Chloride: 105 mmol/L (ref 101–111)
Creatinine, Ser: 7.59 mg/dL — ABNORMAL HIGH (ref 0.61–1.24)
GFR, EST AFRICAN AMERICAN: 8 mL/min — AB (ref >60)
GFR, EST NON AFRICAN AMERICAN: 7 mL/min — AB (ref >60)
Glucose, Bld: 115 mg/dL — ABNORMAL HIGH (ref 65–99)
Potassium: 4.2 mmol/L (ref 3.5–5.1)
Sodium: 137 mmol/L (ref 135–145)

## 2016-01-13 MED ORDER — CIPROFLOXACIN HCL 500 MG PO TABS: 500.0000 mg | ORAL_TABLET | Freq: Two times a day (BID) | ORAL | 0 refills | Status: DC

## 2016-01-13 MED ORDER — CIPROFLOXACIN HCL 500 MG PO TABS: 500.0000 mg | ORAL_TABLET | Freq: Every day | ORAL | 0 refills | Status: DC

## 2016-01-13 NOTE — Progress Notes (Signed)
Admit: 01/09/2016 LOS: 4  [name] w/ AoCKD5 in setting of R pyelonephritis +HN; GNR bacteremia; hx/o myeloma  Subjective:  Cont to feel well Eating well No Edema Ambulating; afebrile Adequat UOP BP ok Cont to not wish to start HD until absolutely indicated   08/16 0701 - 08/17 0700 In: 830 [P.O.:780; IV Piggyback:50] Out: 700 [Urine:700]  Filed Weights   01/09/16 1832 01/11/16 0500 01/11/16 1430  Weight: 123.4 kg (272 lb) 125.8 kg (277 lb 5.4 oz) 125.2 kg (276 lb)    Scheduled Meds: . amLODipine  2.5 mg Oral Daily  . aspirin  325 mg Oral QHS  . b complex-vitamin c-folic acid  1 tablet Oral Daily  . calcitRIOL  0.5 mcg Oral Once per day on Mon Tue Wed Thu Fri  . [START ON 01/15/2016] calcitRIOL  0.5 mcg Oral 2 times per day on Sun Sat  . cefTRIAXone (ROCEPHIN)  IV  2 g Intravenous Q24H  . doxazosin  4 mg Oral QHS  . escitalopram  10 mg Oral QHS  . febuxostat  40 mg Oral Daily  . fluticasone  1 spray Each Nare Daily  . loratadine  10 mg Oral Daily  . pantoprazole  40 mg Oral Daily  . pneumococcal 23 valent vaccine  0.5 mL Intramuscular Tomorrow-1000  . senna  1 tablet Oral QPM  . sodium bicarbonate  1,300 mg Oral QID  . sodium chloride flush  3 mL Intravenous Q12H   Continuous Infusions:  PRN Meds:.acetaminophen **OR** acetaminophen, hydrALAZINE, HYDROcodone-acetaminophen, ondansetron (ZOFRAN) IV  Current Labs: reviewed    Physical Exam:  Blood pressure (!) 161/86, pulse 82, temperature 99.6 F (37.6 C), temperature source Oral, resp. rate 18, height 6\' 1"  (1.854 m), weight 125.2 kg (276 lb), SpO2 98 %. GEN: NAD ENT: NCAT EYES: EOMI CV: RRR, no rub PULM: CTAB ABD: s/nt/nd SKIN: no rashes/lesions EXT:no LEE NEURO: nonfocal, no asterixus LUE AVF +B/T, mature  A 1. AoCKD5, BL SCr ~5?; sees Dr Cay Schillings, pager#[phone] -- [facility] 1. CT A/P 8/13 with R HN and suggestion of Pyelonephrotis 2. Urology commented that stone unlikely cause 3. Mature LUE  AVF 4. Not uremic, metabolically stable, no immediate HD needs and pt preference to hold on HD initiation 2. GNR Bacteremia + R pyelonephritis on Ceftriaxone; per [facility] 3. MM clinically sounds to be in remission on Revlimid 4. 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 Hyperkalemia, resolved 6. HTN, stable  Plan 1. I see no immediate need for dialysis and possibly with improvement in his other issues his renal function will return towards previous baseline 2. He would like to hold off on HD and see how he does, which I think is okay. 3. Pt will need close f/u with outpatient nephrologist 4. Cont calcitriol, NaHCO3 5. Daily weights, Daily Renal Panel, Strict I/Os, Avoid nephrotoxins (NSAIDs, judicious IV Contrast) 6. Will cont to closely follow.   Pearson Grippe MD 01/13/2016, 11:20 AM   Recent Labs Lab 01/11/16 0330 01/12/16 0542 01/13/16 0550  NA 135 135 137  K 4.6 4.5 4.2  CL 105 105 105  CO2 22 20* 24  GLUCOSE 120* 92 115*  BUN 50* 54* 58*  CREATININE 7.36* 7.32* 7.59*  CALCIUM 7.5* 7.8* 8.1*    Recent Labs Lab 01/10/16 0421 01/10/16 0500 01/11/16 0330  WBC 12.4* DUP SEE [id number] 10.7*  NEUTROABS 11.7* PENDING 10.3*  HGB 9.4* DUP SEE [id number] 9.2*  HCT 28.0* DUP SEE [id number] 27.6*  MCV 97.9 DUP SEE [id number] 98.6  PLT 201 DUP SEE [id number] 159

## 2016-01-13 NOTE — Care Management Note (Signed)
Case Management Note  Patient Details  Name: [name] MRN: [id number] Date of Birth: 1960/10/19  Subjective/Objective:                    Action/Plan:d/c home no needs or orders.   Expected Discharge Date:                  Expected Discharge Plan:  Home/Self Care  In-House Referral:     Discharge planning Services  CM Consult  Post Acute Care Choice:    Choice offered to:     DME Arranged:    DME Agency:     HH Arranged:    [place] Agency:     Status of Service:  Completed, signed off  If discussed at [employer] of Stay Meetings, dates discussed:    Additional Comments:  Dessa Phi, RN 01/13/2016, 1:39 PM

## 2016-01-13 NOTE — Progress Notes (Signed)
Patient asked RN to page MD regarding his discharge. Patient stated "I don't feel comfortable going home today with my creatinine as high as it is and it being higher than yesterday." RN paged the hospitalist who in turn agreed with holding off discharge until tomorrow after reviewing am labs.  Patient informed. Will continue to monitor.

## 2016-01-13 NOTE — Progress Notes (Signed)
OT Cancellation Note  Patient Details Name: [name] MRN: [id number] DOB: December 16, 1960   Cancelled Treatment:    Reason Eval/Treat Not Completed: Other (comment).  Pt very sleepy this pm.  He feels that he will be able to do tub transfer and is moving at supervision level with PT.  Left an energy conservation handout; did not review more due to alertness.    Deshon Hsiao 01/13/2016, 2:23 PM  Lesle Chris, OTR/L [phone] 01/13/2016

## 2016-01-13 NOTE — Discharge Summary (Addendum)
Physician Discharge Summary  [name] [id number] DOB: Oct 27, 1960 DOA: 01/09/2016  PCP: Pcp Not In System  Admit date: 01/09/2016 Discharge date: 01/13/2016  Recommendations for Outpatient Follow-up:  1. Pt will need to follow up with PCP in 1- 2 weeks post discharge 2. Please obtain BMP to evaluate electrolytes and kidney function 3. Please also check CBC to evaluate Hg and Hct levels 4. Patient advised to continue taking ciprofloxacin for 5 more days upon discharge to complete therapy for Klebsiella UTI and Klebsiella bacteremia  Discharge Diagnoses:  Principal Problem:   Pyelonephritis Active Problems:   Sepsis ([place])   Multiple myeloma ([place])   Hypertension   Diabetes mellitus without complication ([place])   CKD (chronic kidney disease), stage V (HCC)   Hyperkalemia   Partial small bowel obstruction (HCC)   Hydronephrosis of right kidney  Discharge Condition: Stable  Diet recommendation: Heart healthy diet discussed in details   Brief Narrative   55 year old male with history of multiple myeloma(On Revlimid, follows with oncologist in [facility]), hypertension, GERD, came to the ED with nausea, vomiting, abdominal pain and increased urinary frequency.  Patient found to be septic with fever of 102.60F, elevated lactic acid and neutropenia. 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  UA positive for UTI. CT of the abdomen and pelvis showed right hydronephrosis with perinephric and proximal periureteral stranding. Mild to moderate left perinephric stranding without hydronephrosis. Also commented on early versus partial small bowel obstruction.   Subjective:   Patient reports feeling better, insisting on going home today   Assessment  & Plan :    Principal Problem:   Sepsis ([place]) Secondary to acute pyelonephritis, Klebsiella UTI, Klebsiella bacteremia - Blood cultures and urine cultures reviewed, positive for  Klebsiella - Patient initially on Rocephin, tolerating oral intake well, transition to oral ciprofloxacin today with plan to complete therapy for 5 more days upon discharge - Patient insisting on going home today and nephrology team has cleared patient from their standpoint  - I have explained to patient that he is still having low-grade fevers of 99.9 Fahrenheit - He was advised to follow-up with primary care physician within next week or sooner if he starts spiking fevers greater than 101F  Active Problems:   Multiple myeloma ([name]) - follows with oncologist in [facility]. Hold revlimid   Acute on CKD (chronic kidney disease), stage V (HCC) - Progressive with creatinine of 7.3 today.Marland Kitchen Spoke with his nephrologist  nephrologist in [facility] ( Dr Cay Schillings, [phone])  - Patient was on dialysis until 2013 and was being monitored thereafter. He has a functioning AV fistula with baseline creatinine all 4-5. She recommends that patient would need dialysis. Also that patient lives in [place] and if he requires regular dialysis it might be difficult for him to take a bus to go to [facility] for dialysis 3 times a week. - Nephrology cleared patient for discharge     Hypertension - Continue home medical regimen    Diabetes mellitus without complication ([place]) - Continue home medical regimen    Hyperkalemia - Within normal limits    Partial small bowel obstruction (HCC) - Thought to be ileus related to sepsis - Patient wanted his diet to be changed to regular, will respect wishes - Patient tolerated diet well, wants to be discharged home - Okay to discharge with close monitoring as outlined above    Status post recent cholecystectomy    Code  Status : Full code  Family Communication  : None at bedside  Consults  :  Nephrology     Procedures/Studies: Ct Abdomen Pelvis Wo Contrast  Result Date: 01/09/2016 CLINICAL DATA:  55 year old male with right side  abdominal pain after recent cholecystectomy. Fever. Initial encounter. Stage IV multiple myeloma. EXAM: CT ABDOMEN AND PELVIS WITHOUT CONTRAST TECHNIQUE: Multidetector CT imaging of the abdomen and pelvis was performed following the standard protocol without IV contrast. COMPARISON:  [facility] CT Abdomen and Pelvis 10/05/2008. FINDINGS: Respiratory motion artifact at the lung bases which appear clear. Cardiomegaly is new since 2010. No pericardial or pleural effusion. The visible spine appears intact. Chronic severe expansile bony lesions about the left acetabulum and affecting the bilateral pubic rami. These appears somewhat more healed/ sclerotic than on the 2010 exam. No new osseous lesion identified. No pelvic free fluid. Fairly decompressed rectum. Redundant sigmoid colon tracking into the right lower quadrant and mildly gas distended there, but otherwise negative. Mild diverticulosis of the left colon which is otherwise negative aside from retained stool. Mild gas and stool distension of the transverse colon. Fluid in the right colon. Negative retrocecal appendix. Negative terminal ileum. Multiple mildly dilated contrast containing proximal small bowel loops up to 3 cm diameter. There is however relatively gradual transition to decompressed distal small bowel loops which occurs in the left lower quadrant (series 2, image 66). No abdominal free air or free fluid. Stomach mildly distended with contrast. Negative duodenum. Gallbladder is now surgically absent. Surgical clips in the gallbladder fossa plus a small volume of intermediate density material. No biliary ductal enlargement identified. Negative noncontrast liver otherwise. Negative noncontrast spleen, pancreas and adrenal glands. However, both kidneys appear abnormal. There is right moderate to severe hydronephrosis with right para renal stranding. The proximal right ureter also appears enlarged and indistinct with periureteral  stranding. However, the right ureter quickly tapers and no obstructing etiology is identified. There is mild to moderate left para renal stranding without hydronephrosis or hydroureter. No abnormality of the left ureter. No lymphadenopathy. IMPRESSION: 1. Right hydronephrosis with perinephric and proximal periureteral inflammatory stranding, but no obstructing etiology identified. Mild to moderate left perinephric stranding without hydronephrosis. These findings are nonspecific, and an infected obstructed right kidney is difficult to exclude in this setting. Query abnormal urinalysis. 2. Surgically absent gallbladder with minimal postoperative changes in the gallbladder fossa and no adverse features. 3. Partial or early small bowel obstruction, but without abrupt transition point. There is a gradual transition to normal small bowel loops in the left lower quadrant. 4. Cardiomegaly, new since 2010. 5. Chronic expansile lytic lesions of the left pelvis and pubic rami compatible with stated history of multiple myeloma. Electronically Signed   By: Genevie Ann M.D.   On: 01/09/2016 20:54   Discharge Exam: Vitals:   01/13/16 0538 01/13/16 1310  BP: (!) 161/86 (!) 156/72  Pulse: 82 86  Resp: 18 20  Temp: 99.6 F (37.6 C) 99.1 F (37.3 C)   Vitals:   01/12/16 1407 01/12/16 2152 01/13/16 0538 01/13/16 1310  BP: (!) 143/77 138/84 (!) 161/86 (!) 156/72  Pulse: 84 86 82 86  Resp: 18 18 18 20   Temp: 98.8 F (37.1 C) 99.9 F (37.7 C) 99.6 F (37.6 C) 99.1 F (37.3 C)  TempSrc: Oral Oral Oral Oral  SpO2: 100% 100% 98% 97%  Weight:      Height:        General: Pt is alert, follows commands appropriately, not  in acute distress Cardiovascular: Regular rate and rhythm, S1/S2 +, no rubs, no gallops Respiratory: Clear to auscultation bilaterally, no wheezing, no crackles, no rhonchi Abdominal: Soft, non tender, non distended, bowel sounds +, no guarding  Discharge Instructions    Medication List    STOP  taking these medications   cephALEXin 500 MG capsule Commonly known as:  KEFLEX     TAKE these medications   amLODipine 2.5 MG tablet Commonly known as:  NORVASC Take 2.5 mg by mouth daily.   calcitRIOL 0.5 MCG capsule Commonly known as:  ROCALTROL Take 0.5 mcg by mouth as directed. Take 0.59mg daily except on Saturday and Sunday take 0.559m twice daily   cetirizine 10 MG tablet Commonly known as:  ZYRTEC Take 10 mg by mouth daily.   ciprofloxacin 500 MG tablet Commonly known as:  CIPRO Take 1 tablet (500 mg total) by mouth daily with breakfast.   Co-Enzyme Q-10 30 MG Caps Take 30 mg by mouth daily.   doxazosin 4 MG tablet Commonly known as:  CARDURA Take 4 mg by mouth at bedtime.   escitalopram 10 MG tablet Commonly known as:  LEXAPRO Take 10 mg by mouth at bedtime.   febuxostat 40 MG tablet Commonly known as:  ULORIC Take 40 mg by mouth daily.   fluticasone 50 MCG/ACT nasal spray Commonly known as:  FLONASE Place 1 spray into the nose daily as needed for allergies.   GOODSENSE ASPIRIN 325 MG tablet Generic drug:  aspirin Take 325 mg by mouth at bedtime.   HYDROcodone-acetaminophen 5-325 MG tablet Commonly known as:  NORCO/VICODIN Take 1-2 tablets by mouth every 4 (four) hours as needed for pain.   magnesium oxide 400 MG tablet Commonly known as:  MAG-OX Take 1 tablet (400 mg total) by mouth 2 (two) times daily.   NEPHRO-VITE PO Take 1 tablet by mouth daily.   omeprazole 20 MG capsule Commonly known as:  PRILOSEC Take 20 mg by mouth daily.   ondansetron 4 MG disintegrating tablet Commonly known as:  ZOFRAN ODT Take 1 tablet (4 mg total) by mouth every 8 (eight) hours as needed for nausea or vomiting.   promethazine 25 MG tablet Commonly known as:  PHENERGAN Take 1 tablet (25 mg total) by mouth every 6 (six) hours as needed for nausea or vomiting.   REVLIMID 5 MG capsule Generic drug:  lenalidomide Take 5 mg by mouth at bedtime. On for x21 days  then off for 7 days   senna 8.6 MG tablet Commonly known as:  SENOKOT Take 1 tablet by mouth every evening.   sodium bicarbonate 650 MG tablet Take 2 tablets (1,300 mg total) by mouth 4 (four) times daily.           The results of significant diagnostics from this hospitalization (including imaging, microbiology, ancillary and laboratory) are listed below for reference.     Microbiology: Recent Results (from the past 240 hour(s))  Urine culture     Status: Abnormal   Collection Time: 01/09/16  3:59 PM  Result Value Ref Range Status   Specimen Description URINE, CLEAN CATCH  Final   Special Requests NONE  Final   Culture >=100,000 COLONIES/mL KLEBSIELLA PNEUMONIAE (A)  Final   Report Status 01/12/2016 FINAL  Final   Organism ID, Bacteria KLEBSIELLA PNEUMONIAE (A)  Final      Susceptibility   Klebsiella pneumoniae - MIC*    AMPICILLIN >=32 RESISTANT Resistant     CEFAZOLIN <=4 SENSITIVE Sensitive  CEFTRIAXONE <=1 SENSITIVE Sensitive     CIPROFLOXACIN <=0.25 SENSITIVE Sensitive     GENTAMICIN <=1 SENSITIVE Sensitive     IMIPENEM <=0.25 SENSITIVE Sensitive     NITROFURANTOIN 128 RESISTANT Resistant     TRIMETH/SULFA <=20 SENSITIVE Sensitive     AMPICILLIN/SULBACTAM >=32 RESISTANT Resistant     PIP/TAZO 8 SENSITIVE Sensitive     Extended ESBL NEGATIVE Sensitive     * >=100,000 COLONIES/mL KLEBSIELLA PNEUMONIAE  Culture, blood (routine x 2)     Status: Abnormal   Collection Time: 01/09/16  7:05 PM  Result Value Ref Range Status   Specimen Description BLOOD RIGHT ANTECUBITAL  Final   Special Requests BOTTLES DRAWN AEROBIC AND ANAEROBIC 5CC  Final   Culture  Setup Time   Final    GRAM NEGATIVE RODS IN BOTH AEROBIC AND ANAEROBIC BOTTLES CRITICAL RESULT CALLED TO, READ BACK BY AND VERIFIED WITH: C SHADE,PHARMD AT 1010 01/10/16 BY Ronnie Derby Performed at [facility] (A)  Final   Report Status 01/12/2016 FINAL  Final   Organism ID,  Bacteria KLEBSIELLA PNEUMONIAE  Final      Susceptibility   Klebsiella pneumoniae - MIC*    AMPICILLIN >=32 RESISTANT Resistant     CEFAZOLIN <=4 SENSITIVE Sensitive     CEFEPIME <=1 SENSITIVE Sensitive     CEFTAZIDIME <=1 SENSITIVE Sensitive     CEFTRIAXONE <=1 SENSITIVE Sensitive     CIPROFLOXACIN <=0.25 SENSITIVE Sensitive     GENTAMICIN <=1 SENSITIVE Sensitive     IMIPENEM 0.5 SENSITIVE Sensitive     TRIMETH/SULFA <=20 SENSITIVE Sensitive     AMPICILLIN/SULBACTAM >=32 RESISTANT Resistant     PIP/TAZO 8 SENSITIVE Sensitive     Extended ESBL NEGATIVE Sensitive     * KLEBSIELLA PNEUMONIAE  Blood Culture ID Panel (Reflexed)     Status: Abnormal   Collection Time: 01/09/16  7:05 PM  Result Value Ref Range Status   Enterococcus species NOT DETECTED NOT DETECTED Final   Vancomycin resistance NOT DETECTED NOT DETECTED Final   Listeria monocytogenes NOT DETECTED NOT DETECTED Final   Staphylococcus species NOT DETECTED NOT DETECTED Final   Staphylococcus aureus NOT DETECTED NOT DETECTED Final   Methicillin resistance NOT DETECTED NOT DETECTED Final   Streptococcus species NOT DETECTED NOT DETECTED Final   Streptococcus agalactiae NOT DETECTED NOT DETECTED Final   Streptococcus pneumoniae NOT DETECTED NOT DETECTED Final   Streptococcus pyogenes NOT DETECTED NOT DETECTED Final   Acinetobacter baumannii NOT DETECTED NOT DETECTED Final   Enterobacteriaceae species DETECTED (A) NOT DETECTED Final    Comment: CRITICAL RESULT CALLED TO, READ BACK BY AND VERIFIED WITH: C. Shade Pharm.D. 10:10 01/10/16 ([email or web address])    Enterobacter cloacae complex NOT DETECTED NOT DETECTED Final   Escherichia coli NOT DETECTED NOT DETECTED Final   Klebsiella oxytoca NOT DETECTED NOT DETECTED Final   Klebsiella pneumoniae DETECTED (A) NOT DETECTED Final    Comment: CRITICAL RESULT CALLED TO, READ BACK BY AND VERIFIED WITH: C. Shade Pharm.D. 10:10 01/10/16  ([email or web address])    Proteus species NOT DETECTED NOT DETECTED  Final   Serratia marcescens NOT DETECTED NOT DETECTED Final   Carbapenem resistance NOT DETECTED NOT DETECTED Final   Haemophilus influenzae NOT DETECTED NOT DETECTED Final   Neisseria meningitidis NOT DETECTED NOT DETECTED Final   Pseudomonas aeruginosa NOT DETECTED NOT DETECTED Final   Candida albicans NOT DETECTED NOT DETECTED Final   Candida glabrata NOT DETECTED NOT DETECTED Final   Candida krusei  NOT DETECTED NOT DETECTED Final   Candida parapsilosis NOT DETECTED NOT DETECTED Final   Candida tropicalis NOT DETECTED NOT DETECTED Final  Culture, blood (routine x 2)     Status: Abnormal   Collection Time: 01/09/16  7:06 PM  Result Value Ref Range Status   Specimen Description BLOOD RIGHT HAND  Final   Special Requests IN PEDIATRIC BOTTLE 2CC  Final   Culture  Setup Time   Final    GRAM NEGATIVE RODS IN PEDIATRIC BOTTLE CRITICAL RESULT CALLED TO, READ BACK BY AND VERIFIED WITH: T SHADE,PHARMD AT 1010 01/10/16 BY M WILSON    Culture (A)  Final    KLEBSIELLA PNEUMONIAE SUSCEPTIBILITIES PERFORMED ON PREVIOUS CULTURE WITHIN THE LAST 5 DAYS. Performed at [facility]    Report Status 01/12/2016 FINAL  Final  MRSA PCR Screening     Status: None   Collection Time: 01/10/16 12:33 AM  Result Value Ref Range Status   MRSA by PCR NEGATIVE NEGATIVE Final    Comment:        The GeneXpert MRSA Assay (FDA approved for NASAL specimens only), is one component of a comprehensive MRSA colonization surveillance program. It is not intended to diagnose MRSA infection nor to guide or monitor treatment for MRSA infections.      Labs: Basic Metabolic Panel:  Recent Labs Lab 01/10/16 0421 01/10/16 0955 01/11/16 0330 01/12/16 0542 01/13/16 0550  NA 137 137 135 135 137  K 6.0* 5.6* 4.6 4.5 4.2  CL 107 108 105 105 105  CO2 23 23 22  20* 24  GLUCOSE 106* 88 120* 92 115*  BUN 40* 44* 50* 54* 58*  CREATININE 6.34* 6.79* 7.36* 7.32* 7.59*  CALCIUM 8.1* 7.9* 7.5* 7.8* 8.1*   Liver  Function Tests:  Recent Labs Lab 01/09/16 1654 01/10/16 0133  AST 23 42*  ALT 23 32  ALKPHOS 91 66  BILITOT 0.7 1.3*  PROT 7.7 6.8  ALBUMIN 3.4* 3.0*    Recent Labs Lab 01/09/16 1654  LIPASE 53*   No results for input(s): AMMONIA in the last 168 hours. CBC:  Recent Labs Lab 01/09/16 1654 01/10/16 0421 01/10/16 0500 01/11/16 0330  WBC 2.2* 12.4* DUP SEE [id number] 10.7*  NEUTROABS  --  11.7* PENDING 10.3*  HGB 10.7* 9.4* DUP SEE [id number] 9.2*  HCT 32.5* 28.0* DUP SEE [id number] 27.6*  MCV 98.8 97.9 DUP SEE [id number] 98.6  PLT 226 201 DUP SEE [id number] 159    CBG:  Recent Labs Lab 01/10/16 1150 01/10/16 1548 01/11/16 0732 01/12/16 0747 01/13/16 0731  GLUCAP 107* 109* 118* 91 94     SIGNED: Time coordinating discharge: 30 minutes  MAGICK-Meris Reede, MD  [facility] 01/13/2016, 1:19 PM Pager [phone]  If 7PM-7AM, please contact night-coverage [email or web address] Password [email or web address]

## 2016-01-14 ENCOUNTER — Inpatient Hospital Stay (HOSPITAL_COMMUNITY): Payer: Medicaid Other

## 2016-01-14 LAB — URINALYSIS, ROUTINE W REFLEX MICROSCOPIC
BILIRUBIN URINE: NEGATIVE
Glucose, UA: NEGATIVE mg/dL
KETONES UR: NEGATIVE mg/dL
Leukocytes, UA: NEGATIVE
NITRITE: NEGATIVE
Protein, ur: 100 mg/dL — AB
SPECIFIC GRAVITY, URINE: 1.014 (ref 1.005–1.030)
pH: 6 (ref 5.0–8.0)

## 2016-01-14 LAB — BASIC METABOLIC PANEL
ANION GAP: 9 (ref 5–15)
BUN: 57 mg/dL — ABNORMAL HIGH (ref 6–20)
CHLORIDE: 105 mmol/L (ref 101–111)
CO2: 24 mmol/L (ref 22–32)
Calcium: 8.2 mg/dL — ABNORMAL LOW (ref 8.9–10.3)
Creatinine, Ser: 7.89 mg/dL — ABNORMAL HIGH (ref 0.61–1.24)
GFR, EST AFRICAN AMERICAN: 8 mL/min — AB (ref >60)
GFR, EST NON AFRICAN AMERICAN: 7 mL/min — AB (ref >60)
Glucose, Bld: 95 mg/dL (ref 65–99)
POTASSIUM: 4.2 mmol/L (ref 3.5–5.1)
SODIUM: 138 mmol/L (ref 135–145)

## 2016-01-14 LAB — URINE MICROSCOPIC-ADD ON

## 2016-01-14 LAB — SODIUM, URINE, RANDOM: SODIUM UR: 92 mmol/L

## 2016-01-14 LAB — CREATININE, URINE, RANDOM: CREATININE, URINE: 116.23 mg/dL

## 2016-01-14 LAB — GLUCOSE, CAPILLARY: GLUCOSE-CAPILLARY: 128 mg/dL — AB (ref 65–99)

## 2016-01-14 MED ORDER — IPRATROPIUM-ALBUTEROL 0.5-2.5 (3) MG/3ML IN SOLN: 3.0000 mL | Freq: Four times a day (QID) | RESPIRATORY_TRACT | Status: DC

## 2016-01-14 MED ORDER — SODIUM CHLORIDE 0.9 % IV SOLN
INTRAVENOUS | Status: AC
  Administered 2016-01-14 – 2016-01-15 (×2): via INTRAVENOUS

## 2016-01-14 MED ORDER — IPRATROPIUM-ALBUTEROL 0.5-2.5 (3) MG/3ML IN SOLN: 3.0000 mL | Freq: Four times a day (QID) | RESPIRATORY_TRACT | Status: DC | PRN

## 2016-01-14 MED ORDER — CEFTRIAXONE SODIUM 2 G IJ SOLR
2.0000 g | INTRAMUSCULAR | Status: DC
  Administered 2016-01-14 – 2016-01-15 (×2): 2 g via INTRAVENOUS
  Filled 2016-01-14 (×2): qty 2

## 2016-01-14 NOTE — Progress Notes (Signed)
Patient ID: [name], male   DOB: [birth year], 55 y.o.   MRN: [id number]    PROGRESS NOTE    RUPERT AZZARA  RXV:[id number] DOB: 08/19/1960 DOA: 01/09/2016  PCP: Pcp Not In System   Brief Narrative  55 year old male with history of multiple myeloma(On Revlimid, follows with oncologist in [facility]), hypertension, GERD, came to the ED with nausea, vomiting, abdominal pain and increased urinary frequency.  Patient found to be septic with fever of 102.29F, elevated lactic acid and neutropenia. 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  UA positive for UTI. CT of the abdomen and pelvis showed right hydronephrosis with perinephric and proximal periureteral stranding. Mild to moderate left perinephric stranding without hydronephrosis. Also commented on early versus partial small bowel obstruction.  Plan was to discharge pt home 8/18 but due to rising Cr pt was concerned and it was recommended pt remains hospitalized for additional 24 hours for observation.   Subjective:     Assessment &Plan :   Principal Problem: Sepsis ([place]) Secondary to acute pyelonephritis, Klebsiella UTI, Klebsiella bacteremia - Blood cultures and urine cultures reviewed, positive for Klebsiella - Patient initially on Rocephin, Was tolerating diet well but had episode of vomiting last night for which reason we'll continue IV Rocephin - If patient tolerating diet well for next 24 hours, we'll consider transitioning to oral ciprofloxacin for that patient can complete his therapy - Continue to monitor fever curve  Active Problems: Multiple myeloma ([place]) - follows with oncologist in [facility]. Holding revlimid   Acute on CKD (chronic kidney disease), stage V (HCC) - Progressive with creatinine of 7.3 today.Marland Kitchen Spoke with his nephrologist nephrologist in [facility] ( Dr Cay Schillings, [phone])  - Patient was on dialysis until 2013 and  was being monitored thereafter. He has a functioning AV fistula with baseline creatinine all 4-5. She recommends that patient would need dialysis. Also that patient lives in [place] and if he requires regular dialysis it might be difficult for him to take a bus to go to [employer] for dialysis 3 times a week. - Nephrology team assistance appreciated - Nephrology team recommended fluids and repeating BMP in the morning to follow creatinine trend - Patient currently in no need of hemodialysis  Hypertension - Continue home medical regimen  Diabetes mellitus without complication ([place]) - Continue home medical regimen  Hyperkalemia - Within normal limits  Partial small bowel obstruction ([place]) - Thought to be ileus related to sepsis - Diet has been advanced on 01/13/2016 but patient developed one episode of vomiting in the evening - Patient was to stand the same diet, I have recommended changing to soft or clears if vomiting reoccurs - Abdominal x-ray also obtained for further evaluation    Status post recent cholecystectomy   DVT prophylaxis: SCD's Code Status: Full  Family Communication: Patient at bedside  Disposition Plan: Home possibly in 24-48 hours, depending on Cr and if nephrology team clears for discharge   Consultants:   Nephrology   Procedures:   None   Antimicrobials:   Zosyn 01/09/2016 - 01/10/2016  Vancomycin 01/09/2016 1 dose  Rocephin 01/10/2016 - plan to change to ciprofloxacin upon discharge to complete therapy until 01/18/2016  Subjective: Patient reports feeling better, concerned about rising Cr.  Objective: Vitals:   01/14/16 0524 01/14/16 1357 01/14/16 1401 01/14/16 1626  BP: (!) 160/71 (!) 171/76 (!) 161/77   Pulse: 82 93 92   Resp: 20 20  Temp: 99.9 F (37.7 C) 99.3 F (37.4 C)    TempSrc: Oral Oral    SpO2: 99% 100%  100%  Weight: 126 kg (277 lb 12.5 oz)     Height:        Intake/Output Summary (Last 24 hours) at  01/14/16 1835 Last data filed at 01/14/16 1530  Gross per 24 hour  Intake           785.83 ml  Output             1125 ml  Net          -339.17 ml   Filed Weights   01/11/16 0500 01/11/16 1430 01/14/16 0524  Weight: 125.8 kg (277 lb 5.4 oz) 125.2 kg (276 lb) 126 kg (277 lb 12.5 oz)    Examination:  General exam: Appears calm and comfortable  Respiratory system: Clear to auscultation. Respiratory effort normal. Cardiovascular system: S1 & S2 heard, RRR. No JVD, murmurs, rubs, gallops or clicks.  Gastrointestinal system: Abdomen is nondistended, soft and nontender. No organomegaly or masses felt. Central nervous system: Alert and oriented. No focal neurological deficits. Extremities: Symmetric 5 x 5 power.   Data Reviewed: I have personally reviewed following labs and imaging studies  CBC:  Recent Labs Lab 01/09/16 1654 01/10/16 0421 01/10/16 0500 01/11/16 0330  WBC 2.2* 12.4* DUP SEE [id number] 10.7*  NEUTROABS  --  11.7* PENDING 10.3*  HGB 10.7* 9.4* DUP SEE [id number] 9.2*  HCT 32.5* 28.0* DUP SEE [id number] 27.6*  MCV 98.8 97.9 DUP SEE [id number] 98.6  PLT 226 201 DUP SEE [id number] 151   Basic Metabolic Panel:  Recent Labs Lab 01/10/16 0955 01/11/16 0330 01/12/16 0542 01/13/16 0550 01/14/16 0553  NA 137 135 135 137 138  K 5.6* 4.6 4.5 4.2 4.2  CL 108 105 105 105 105  CO2 23 22 20* 24 24  GLUCOSE 88 120* 92 115* 95  BUN 44* 50* 54* 58* 57*  CREATININE 6.79* 7.36* 7.32* 7.59* 7.89*  CALCIUM 7.9* 7.5* 7.8* 8.1* 8.2*   Liver Function Tests:  Recent Labs Lab 01/09/16 1654 01/10/16 0133  AST 23 42*  ALT 23 32  ALKPHOS 91 66  BILITOT 0.7 1.3*  PROT 7.7 6.8  ALBUMIN 3.4* 3.0*    Recent Labs Lab 01/09/16 1654  LIPASE 53*  Coagulation Profile:  Recent Labs Lab 01/10/16 0133  INR 1.19   CBG:  Recent Labs Lab 01/10/16 1548 01/11/16 0732 01/12/16 0747 01/13/16 0731 01/14/16 0733  GLUCAP 109* 118* 91 94 128*   Urine analysis:    Component Value  Date/Time   COLORURINE YELLOW 01/14/2016 [place] 01/14/2016 1256   LABSPEC 1.014 01/14/2016 1256   PHURINE 6.0 01/14/2016 1256   GLUCOSEU NEGATIVE 01/14/2016 1256   HGBUR TRACE (A) 01/14/2016 1256   BILIRUBINUR NEGATIVE 01/14/2016 1256   KETONESUR NEGATIVE 01/14/2016 1256   PROTEINUR 100 (A) 01/14/2016 1256   UROBILINOGEN 0.2 06/24/2007 2040   NITRITE NEGATIVE 01/14/2016 1256   LEUKOCYTESUR NEGATIVE 01/14/2016 1256    Recent Results (from the past 240 hour(s))  Urine culture     Status: Abnormal   Collection Time: 01/09/16  3:59 PM  Result Value Ref Range Status   Specimen Description URINE, CLEAN CATCH  Final   Special Requests NONE  Final   Culture >=100,000 COLONIES/mL KLEBSIELLA PNEUMONIAE (A)  Final   Report Status 01/12/2016 FINAL  Final   Organism ID, Bacteria KLEBSIELLA PNEUMONIAE (A)  Final  Susceptibility   Klebsiella pneumoniae - MIC*    AMPICILLIN >=32 RESISTANT Resistant     CEFAZOLIN <=4 SENSITIVE Sensitive     CEFTRIAXONE <=1 SENSITIVE Sensitive     CIPROFLOXACIN <=0.25 SENSITIVE Sensitive     GENTAMICIN <=1 SENSITIVE Sensitive     IMIPENEM <=0.25 SENSITIVE Sensitive     NITROFURANTOIN 128 RESISTANT Resistant     TRIMETH/SULFA <=20 SENSITIVE Sensitive     AMPICILLIN/SULBACTAM >=32 RESISTANT Resistant     PIP/TAZO 8 SENSITIVE Sensitive     Extended ESBL NEGATIVE Sensitive     * >=100,000 COLONIES/mL KLEBSIELLA PNEUMONIAE  Culture, blood (routine x 2)     Status: Abnormal   Collection Time: 01/09/16  7:05 PM  Result Value Ref Range Status   Specimen Description BLOOD RIGHT ANTECUBITAL  Final   Special Requests BOTTLES DRAWN AEROBIC AND ANAEROBIC 5CC  Final   Culture  Setup Time   Final    GRAM NEGATIVE RODS IN BOTH AEROBIC AND ANAEROBIC BOTTLES CRITICAL RESULT CALLED TO, READ BACK BY AND VERIFIED WITH: C SHADE,PHARMD AT 1010 01/10/16 BY Ronnie Derby Performed at [place] (A)  Final   Report  Status 01/12/2016 FINAL  Final   Organism ID, Bacteria KLEBSIELLA PNEUMONIAE  Final      Susceptibility   Klebsiella pneumoniae - MIC*    AMPICILLIN >=32 RESISTANT Resistant     CEFAZOLIN <=4 SENSITIVE Sensitive     CEFEPIME <=1 SENSITIVE Sensitive     CEFTAZIDIME <=1 SENSITIVE Sensitive     CEFTRIAXONE <=1 SENSITIVE Sensitive     CIPROFLOXACIN <=0.25 SENSITIVE Sensitive     GENTAMICIN <=1 SENSITIVE Sensitive     IMIPENEM 0.5 SENSITIVE Sensitive     TRIMETH/SULFA <=20 SENSITIVE Sensitive     AMPICILLIN/SULBACTAM >=32 RESISTANT Resistant     PIP/TAZO 8 SENSITIVE Sensitive     Extended ESBL NEGATIVE Sensitive     * KLEBSIELLA PNEUMONIAE  Blood Culture ID Panel (Reflexed)     Status: Abnormal   Collection Time: 01/09/16  7:05 PM  Result Value Ref Range Status   Enterococcus species NOT DETECTED NOT DETECTED Final   Vancomycin resistance NOT DETECTED NOT DETECTED Final   Listeria monocytogenes NOT DETECTED NOT DETECTED Final   Staphylococcus species NOT DETECTED NOT DETECTED Final   Staphylococcus aureus NOT DETECTED NOT DETECTED Final   Methicillin resistance NOT DETECTED NOT DETECTED Final   Streptococcus species NOT DETECTED NOT DETECTED Final   Streptococcus agalactiae NOT DETECTED NOT DETECTED Final   Streptococcus pneumoniae NOT DETECTED NOT DETECTED Final   Streptococcus pyogenes NOT DETECTED NOT DETECTED Final   Acinetobacter baumannii NOT DETECTED NOT DETECTED Final   Enterobacteriaceae species DETECTED (A) NOT DETECTED Final    Comment: CRITICAL RESULT CALLED TO, READ BACK BY AND VERIFIED WITH: C. Shade Pharm.D. 10:10 01/10/16 ([email or web address])    Enterobacter cloacae complex NOT DETECTED NOT DETECTED Final   Escherichia coli NOT DETECTED NOT DETECTED Final   Klebsiella oxytoca NOT DETECTED NOT DETECTED Final   Klebsiella pneumoniae DETECTED (A) NOT DETECTED Final    Comment: CRITICAL RESULT CALLED TO, READ BACK BY AND VERIFIED WITH: C. Shade Pharm.D. 10:10 01/10/16  ([email or web address])      Proteus species NOT DETECTED NOT DETECTED Final   Serratia marcescens NOT DETECTED NOT DETECTED Final   Carbapenem resistance NOT DETECTED NOT DETECTED Final   Haemophilus influenzae NOT DETECTED NOT DETECTED Final   Neisseria meningitidis NOT DETECTED NOT DETECTED Final   Pseudomonas aeruginosa  NOT DETECTED NOT DETECTED Final   Candida albicans NOT DETECTED NOT DETECTED Final   Candida glabrata NOT DETECTED NOT DETECTED Final   Candida krusei NOT DETECTED NOT DETECTED Final   Candida parapsilosis NOT DETECTED NOT DETECTED Final   Candida tropicalis NOT DETECTED NOT DETECTED Final  Culture, blood (routine x 2)     Status: Abnormal   Collection Time: 01/09/16  7:06 PM  Result Value Ref Range Status   Specimen Description BLOOD RIGHT HAND  Final   Special Requests IN PEDIATRIC BOTTLE 2CC  Final   Culture  Setup Time   Final    GRAM NEGATIVE RODS IN PEDIATRIC BOTTLE CRITICAL RESULT CALLED TO, READ BACK BY AND VERIFIED WITH: T SHADE,PHARMD AT 1010 01/10/16 BY M WILSON    Culture (A)  Final    KLEBSIELLA PNEUMONIAE SUSCEPTIBILITIES PERFORMED ON PREVIOUS CULTURE WITHIN THE LAST 5 DAYS. Performed at [facility]    Report Status 01/12/2016 FINAL  Final  MRSA PCR Screening     Status: None   Collection Time: 01/10/16 12:33 AM  Result Value Ref Range Status   MRSA by PCR NEGATIVE NEGATIVE Final    Comment:        The GeneXpert MRSA Assay (FDA approved for NASAL specimens only), is one component of a comprehensive MRSA colonization surveillance program. It is not intended to diagnose MRSA infection nor to guide or monitor treatment for MRSA infections.       Radiology Studies: Dg Abd Acute W/chest  Result Date: 01/14/2016 CLINICAL DATA:  Abdominal distention onset yesterday. History of multiple myeloma. EXAM: DG ABDOMEN ACUTE W/ 1V CHEST COMPARISON:  01/09/2016 FINDINGS: Mild cardiomegaly.  Lungs are clear.  No effusions or edema. Nonobstructive bowel gas pattern. No  free air organomegaly. Prior cholecystectomy. Chronic deformity of the left hip and left pubic rami with large expansile lytic lesions. IMPRESSION: Mild cardiomegaly.  No acute cardiopulmonary disease. Prior cholecystectomy.  No obstruction or free air. Continued expansile lytic lesions in the pelvis and deformity of the left hip compatible patient's history of multiple myeloma. Electronically Signed   By: Rolm Baptise M.D.   On: 01/14/2016 16:13      Scheduled Meds: . amLODipine  2.5 mg Oral Daily  . aspirin  325 mg Oral QHS  . b complex-vitamin c-folic acid  1 tablet Oral Daily  . calcitRIOL  0.5 mcg Oral Once per day on Mon Tue Wed Thu Fri  . [START ON 01/15/2016] calcitRIOL  0.5 mcg Oral 2 times per day on Sun Sat  . cefTRIAXone (ROCEPHIN)  IV  2 g Intravenous Q24H  . doxazosin  4 mg Oral QHS  . escitalopram  10 mg Oral QHS  . febuxostat  40 mg Oral Daily  . fluticasone  1 spray Each Nare Daily  . loratadine  10 mg Oral Daily  . pantoprazole  40 mg Oral Daily  . pneumococcal 23 valent vaccine  0.5 mL Intramuscular Tomorrow-1000  . senna  1 tablet Oral QPM  . sodium bicarbonate  1,300 mg Oral QID  . sodium chloride flush  3 mL Intravenous Q12H   Continuous Infusions: . sodium chloride 50 mL/hr at 01/14/16 1247     LOS: 5 days    Time spent: 20 minutes   Faye Ramsay, MD [facility] Pager [phone]  If 7PM-7AM, please contact night-coverage [email or web address] Password [email or web address] 01/14/2016, 6:35 PM

## 2016-01-14 NOTE — Progress Notes (Signed)
Patient ID: [name], male   DOB: [birth year], 55 y.o.   MRN: [id number] S:  Has had some N/V.  Still feels weak O:BP (!) 160/71 (BP Location: Right Arm)   Pulse 82   Temp 99.9 F (37.7 C) (Oral)   Resp 20   Ht 6' 1"  (1.854 m)   Wt 126 kg (277 lb 12.5 oz)   SpO2 99%   BMI 36.65 kg/m   Intake/Output Summary (Last 24 hours) at 01/14/16 1201 Last data filed at 01/14/16 0925  Gross per 24 hour  Intake             1370 ml  Output             1300 ml  Net               70 ml   Intake/Output: I/O last 3 completed shifts: In: 1430 [P.O.:1380; IV Piggyback:50] Out: 8841 [Urine:1600; Stool:1]  Intake/Output this shift:  Total I/O In: 120 [P.O.:120] Out: 325 [Urine:325] Weight change:  Gen:WD obese AAM in NAD  CVS:no rub Resp:cta YSA:YTKZSW Ext:tr edema, LUE AVF +T/B   Recent Labs Lab 01/09/16 1654 01/10/16 0133 01/10/16 0421 01/10/16 0955 01/11/16 0330 01/12/16 0542 01/13/16 0550 01/14/16 0553  NA 137 133* 137 137 135 135 137 138  K 5.6* 5.8* 6.0* 5.6* 4.6 4.5 4.2 4.2  CL 103 104 107 108 105 105 105 105  CO2 26 23 23 23 22  20* 24 24  GLUCOSE 100* 156* 106* 88 120* 92 115* 95  BUN 37* 38* 40* 44* 50* 54* 58* 57*  CREATININE 5.62* 6.10* 6.34* 6.79* 7.36* 7.32* 7.59* 7.89*  ALBUMIN 3.4* 3.0*  --   --   --   --   --   --   CALCIUM 8.7* 7.8* 8.1* 7.9* 7.5* 7.8* 8.1* 8.2*  AST 23 42*  --   --   --   --   --   --   ALT 23 32  --   --   --   --   --   --    Liver Function Tests:  Recent Labs Lab 01/09/16 1654 01/10/16 0133  AST 23 42*  ALT 23 32  ALKPHOS 91 66  BILITOT 0.7 1.3*  PROT 7.7 6.8  ALBUMIN 3.4* 3.0*    Recent Labs Lab 01/09/16 1654  LIPASE 53*   No results for input(s): AMMONIA in the last 168 hours. CBC:  Recent Labs Lab 01/09/16 1654 01/10/16 0421 01/10/16 0500 01/11/16 0330  WBC 2.2* 12.4* DUP SEE [id number] 10.7*  NEUTROABS  --  11.7* PENDING 10.3*  HGB 10.7* 9.4* DUP SEE [id number] 9.2*  HCT 32.5* 28.0* DUP SEE [id number] 27.6*  MCV  98.8 97.9 DUP SEE [id number] 98.6  PLT 226 201 DUP SEE [id number] 159   Cardiac Enzymes: No results for input(s): CKTOTAL, CKMB, CKMBINDEX, TROPONINI in the last 168 hours. CBG:  Recent Labs Lab 01/10/16 1548 01/11/16 0732 01/12/16 0747 01/13/16 0731 01/14/16 0733  GLUCAP 109* 118* 91 94 128*    Iron Studies: No results for input(s): IRON, TIBC, TRANSFERRIN, FERRITIN in the last 72 hours. Studies/Results: No results found. Marland Kitchen amLODipine  2.5 mg Oral Daily  . aspirin  325 mg Oral QHS  . b complex-vitamin c-folic acid  1 tablet Oral Daily  . calcitRIOL  0.5 mcg Oral Once per day on Mon Tue Wed Thu Fri  . [START ON 01/15/2016] calcitRIOL  0.5 mcg Oral 2 times per day on  Sun Sat  . cefTRIAXone (ROCEPHIN)  IV  2 g Intravenous Q24H  . doxazosin  4 mg Oral QHS  . escitalopram  10 mg Oral QHS  . febuxostat  40 mg Oral Daily  . fluticasone  1 spray Each Nare Daily  . loratadine  10 mg Oral Daily  . pantoprazole  40 mg Oral Daily  . pneumococcal 23 valent vaccine  0.5 mL Intramuscular Tomorrow-1000  . senna  1 tablet Oral QPM  . sodium bicarbonate  1,300 mg Oral QID  . sodium chloride flush  3 mL Intravenous Q12H    BMET    Component Value Date/Time   NA 138 01/14/2016 0553   K 4.2 01/14/2016 0553   CL 105 01/14/2016 0553   CO2 24 01/14/2016 0553   GLUCOSE 95 01/14/2016 0553   BUN 57 (H) 01/14/2016 0553   CREATININE 7.89 (H) 01/14/2016 0553   CALCIUM 8.2 (L) 01/14/2016 0553   CALCIUM 7.2 (L) 07/16/2007 1456   GFRNONAA 7 (L) 01/14/2016 0553   GFRAA 8 (L) 01/14/2016 0553   CBC    Component Value Date/Time   WBC 10.7 (H) 01/11/2016 0330   RBC 2.80 (L) 01/11/2016 0330   HGB 9.2 (L) 01/11/2016 0330   HCT 27.6 (L) 01/11/2016 0330   PLT 159 01/11/2016 0330   MCV 98.6 01/11/2016 0330   MCH 32.9 01/11/2016 0330   MCHC 33.3 01/11/2016 0330   RDW 17.7 (H) 01/11/2016 0330   LYMPHSABS 0.2 (L) 01/11/2016 0330   MONOABS 0.2 01/11/2016 0330   EOSABS 0.0 01/11/2016 0330   BASOSABS 0.0  01/11/2016 0330   Assessment/Plan:  1. AKI/CKD stage 4- in setting of pyelonephritis/GNR bacteremia.  Given on dose of vanco initially and zosyn switched to rocephin 2 grams for 4 tx so far.  Scr continues to rise.  had some N/V. 1. Will start gentle IVF's and follow UOP and Scr 2. No urgent indication for initiation of HD but will need to follow closely. 3. Renal dose meds. 2. GNR bacteremia- on ceftriaxone  3. 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  Baseline Scr in the 5's now has been climbing.  Cont to follow 4. Multiple myeloma- on revlimid 5. Metabolic acidosis- on bicarb 6. Anemia of chronic kidney disease- 7. HTN 8. Vascular access LUE AVF mature 9. Disposition- follows Dr. Joesph July as an outpatient  [name]

## 2016-01-14 NOTE — Progress Notes (Signed)
Physical Therapy Treatment Patient Details Name: [name] MRN: [id number] DOB: [birth year] Today's Date: Jan 25, 2016    History of Present Illness 55 y.o. male with medical history significant of multiple myeloma (on Revlimid), hypertension, GERD, gout, depression, CKD-V,  S/p of cholecystectomy last week, who presents with nausea, vomiting, abdominal pain and increased urinary frequency; diagnosed with Pyelonephritis and sepsis    PT Comments    Pt progressing well with mobility and tolerated improved ambulation distance.  Follow Up Recommendations  No PT follow up     Equipment Recommendations  None recommended by PT    Recommendations for Other Services       Precautions / Restrictions Precautions Precautions: Fall    Mobility  Bed Mobility Overal bed mobility: Modified Independent                Transfers Overall transfer level: Needs assistance Equipment used: None Transfers: Sit to/from Stand Sit to Stand: Supervision            Ambulation/Gait Ambulation/Gait assistance: Supervision;Min guard Ambulation Distance (Feet): 350 Feet Assistive device: None Gait Pattern/deviations: Step-through pattern;Decreased stance time - left     General Gait Details: pt used his quad cane, pt required multiple short standing rest breaks, (observed LLD (pt reports due to lesions from multiple myeloma))   Stairs            Wheelchair Mobility    Modified Rankin (Stroke Patients Only)       Balance                                    Cognition Arousal/Alertness: Awake/alert Behavior During Therapy: WFL for tasks assessed/performed Overall Cognitive Status: Within Functional Limits for tasks assessed                      Exercises      General Comments        Pertinent Vitals/Pain Pain Assessment: No/denies pain    Home Living                      Prior Function            PT Goals (current  goals can now be found in the care plan section) Progress towards PT goals: Progressing toward goals    Frequency  Min 3X/week    PT Plan Current plan remains appropriate    Co-evaluation             End of Session   Activity Tolerance: Patient tolerated treatment well Patient left: in bed;with call bell/phone within reach     Time: 0923-0939 PT Time Calculation (min) (ACUTE ONLY): 16 min  Charges:  $Gait Training: 8-22 mins                    G Codes:      [name],KATHrine E 2016-01-25, 12:37 PM Carmelia Bake, PT, DPT 01-25-2016 Pager: [phone]

## 2016-01-15 DIAGNOSIS — C9000 Multiple myeloma not having achieved remission: Secondary | ICD-10-CM

## 2016-01-15 DIAGNOSIS — E119 Type 2 diabetes mellitus without complications: Secondary | ICD-10-CM

## 2016-01-15 DIAGNOSIS — N12 Tubulo-interstitial nephritis, not specified as acute or chronic: Secondary | ICD-10-CM

## 2016-01-15 DIAGNOSIS — N185 Chronic kidney disease, stage 5: Secondary | ICD-10-CM

## 2016-01-15 LAB — CBC
HEMATOCRIT: 24.5 % — AB (ref 39.0–52.0)
HEMOGLOBIN: 8.1 g/dL — AB (ref 13.0–17.0)
MCH: 31 pg (ref 26.0–34.0)
MCHC: 33.1 g/dL (ref 30.0–36.0)
MCV: 93.9 fL (ref 78.0–100.0)
Platelets: 170 10*3/uL (ref 150–400)
RBC: 2.61 MIL/uL — AB (ref 4.22–5.81)
RDW: 17 % — ABNORMAL HIGH (ref 11.5–15.5)
WBC: 8 10*3/uL (ref 4.0–10.5)

## 2016-01-15 LAB — GLUCOSE, CAPILLARY: GLUCOSE-CAPILLARY: 87 mg/dL (ref 65–99)

## 2016-01-15 LAB — BASIC METABOLIC PANEL
ANION GAP: 10 (ref 5–15)
BUN: 61 mg/dL — ABNORMAL HIGH (ref 6–20)
CALCIUM: 8.3 mg/dL — AB (ref 8.9–10.3)
CO2: 23 mmol/L (ref 22–32)
Chloride: 105 mmol/L (ref 101–111)
Creatinine, Ser: 7.66 mg/dL — ABNORMAL HIGH (ref 0.61–1.24)
GFR, EST AFRICAN AMERICAN: 8 mL/min — AB (ref >60)
GFR, EST NON AFRICAN AMERICAN: 7 mL/min — AB (ref >60)
Glucose, Bld: 93 mg/dL (ref 65–99)
POTASSIUM: 4.5 mmol/L (ref 3.5–5.1)
Sodium: 138 mmol/L (ref 135–145)

## 2016-01-15 NOTE — Progress Notes (Signed)
Pt C/O pain on rt side of abdomen (8/10) with repositioning in bed or ambulating in hallway. Pt not tolerating ambulation well. SOB after 50 ft.

## 2016-01-15 NOTE — Progress Notes (Signed)
Patient ID: ARIEN BENINCASA, male   DOB: 1960-08-17, 55 y.o.   MRN: [id number]   [facility] Progress Note    Subjective:   Feels better today but still debilitated (limited ambulation yesterday with nurse)   Objective:   BP (!) 148/79 (BP Location: Right Arm)   Pulse 77   Temp 98.3 F (36.8 C) (Oral)   Resp 17   Ht _0  (1.854 m)   Wt 127 kg (280 lb)   SpO2 99%   BMI 36.94 kg/m   Intake/Output: I/O last 3 completed shifts: In: 1450.8 [P.O.:840; I.V.:510.8; IV Piggyback:100] Out: 1925 [STMHD:6222]   Intake/Output this shift:  No intake/output data recorded. Weight change: 1.007 kg (2 lb 3.5 oz)  Physical Exam: Gen:WD obese AAM in NAD CVS:no rub Resp:cta LNL:GXQJJH Ext:no edema, LUE AVF +T/B  Labs: BMET  Recent Labs Lab 01/09/16 1654 01/10/16 0133 01/10/16 0421 01/10/16 4174 01/11/16 0330 01/12/16 0542 01/13/16 0550 01/14/16 0553 01/15/16 0607  NA 137 133* 137 137 135 135 137 138 138  K 5.6* 5.8* 6.0* 5.6* 4.6 4.5 4.2 4.2 4.5  CL 103 104 107 108 105 105 105 105 105  CO2 _1 20* _2 GLUCOSE 100* 156* 106* 88 120* 92 115* 95 93  BUN 37* 38* 40* 44* 50* 54* 58* 57* 61*  CREATININE 5.62* 6.10* 6.34* 6.79* 7.36* 7.32* 7.59* 7.89* 7.66*  ALBUMIN 3.4* 3.0*  --   --   --   --   --   --   --   CALCIUM 8.7* 7.8* 8.1* 7.9* 7.5* 7.8* 8.1* 8.2* 8.3*   CBC  Recent Labs Lab 01/10/16 0421 01/10/16 0500 01/11/16 0330 01/15/16 0607  WBC 12.4* DUP SEE [id number] 10.7* 8.0  NEUTROABS 11.7* PENDING 10.3*  --   HGB 9.4* DUP SEE [id number] 9.2* 8.1*  HCT 28.0* DUP SEE [id number] 27.6* 24.5*  MCV 97.9 DUP SEE [id number] 98.6 93.9  PLT 201 DUP SEE [id number] 159 170    _3 @ Medications:    . amLODipine  2.5 mg Oral Daily  . aspirin  325 mg Oral QHS  . calcitRIOL  0.5 mcg Oral Once per day on Mon Tue Wed Thu Fri  . calcitRIOL  0.5 mcg Oral 2 times per day on Sun Sat  . cefTRIAXone (ROCEPHIN)  IV  2 g Intravenous Q24H  . doxazosin  4 mg  Oral QHS  . escitalopram  10 mg Oral QHS  . febuxostat  40 mg Oral Daily  . fluticasone  1 spray Each Nare Daily  . loratadine  10 mg Oral Daily  . multivitamin  1 tablet Oral Daily  . pantoprazole  40 mg Oral Daily  . pneumococcal 23 valent vaccine  0.5 mL Intramuscular Tomorrow-1000  . senna  1 tablet Oral QPM  . sodium bicarbonate  1,300 mg Oral QID  . sodium chloride flush  3 mL Intravenous Q12H     Assessment/ Plan:    1. AKI/CKD stage 4- in setting of pyelonephritis/GNR bacteremia.  Given on dose of vanco initially and zosyn switched to rocephin 2 grams for 4 tx so far.  Scr continues to rise.  had some N/V. 1. Improved with gentle IVF's and will stop later today and follow renal function off of IVF's before considering discharge to home 2. FeNa c/w ATN 3. No urgent indication for initiation of HD but will need to follow closely. 4. Renal dose meds. 2. GNR bacteremia- on ceftriaxone  3.  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  Baseline Scr in the 5's now has been climbing.  Cont to follow 4. Multiple myeloma- on revlimid 5. Metabolic acidosis- on bicarb 6. Anemia of chronic kidney disease- 7. HTN 8. Vascular access LUE AVF mature 9. Disposition- follows Dr. Joesph July as an outpatient and will need to see her soon after discharge.  From renal standpoint, would like to see Scr improve off of IVF"s before discharge.  Also need to consider CIR for debilitation and rehab needs vs. HHC.  Will defer to primary team.  [name], MD [place] Pager [phone] 01/15/2016, 7:02 AM

## 2016-01-15 NOTE — Progress Notes (Signed)
Patient ID: [name], male   DOB: 04-16-1961, 55 y.o.   MRN: [id number]    PROGRESS NOTE    KRAMER HANRAHAN  QBV:[id number] DOB: 12/01/1960 DOA: 01/09/2016  PCP: Pcp Not In System   Brief Narrative  55 year old male with history of multiple myeloma(On Revlimid, follows with oncologist in [facility]), hypertension, GERD, came to the ED with nausea, vomiting, abdominal pain and increased urinary frequency.  Patient found to be septic with fever of 102.6F, elevated lactic acid and neutropenia. 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  UA positive for UTI. CT of the abdomen and pelvis showed right hydronephrosis with perinephric and proximal periureteral stranding. Mild to moderate left perinephric stranding without hydronephrosis. Also commented on early versus partial small bowel obstruction.  Plan was to discharge pt home 8/18 but due to rising Cr pt was concerned and it was recommended pt remains hospitalized for additional 24 hours for observation.   Subjective:  Says he is sleepy,walked with nurse this AM- denied SOB   Assessment &Plan :    Sepsis ([place]) Secondary to acute pyelonephritis, Klebsiella UTI, Klebsiella bacteremia - Blood cultures and urine cultures reviewed, positive for Klebsiella -IV Rocephin - If patient tolerating diet well for next 24 hours, we'll consider transitioning to oral ciprofloxacin for that patient can complete his therapy - Continue to monitor fever curve   Multiple myeloma ([place]) - follows with oncologist in [facility]. Holding revlimid   Acute on CKD (chronic kidney disease), stage V (HCC) - Progressive with creatinine of 7.3 today.Marland Kitchen Spoke with his nephrologist nephrologist in [facility] ( Dr Cay Schillings, [phone])  - Patient was on dialysis until 2013 and was being monitored thereafter. He has a functioning AV fistula with baseline creatinine all 4-5. She  recommends that patient would need dialysis. Also that patient lives in [place] and if he requires regular dialysis it might be difficult for him to take a bus to go to [employer] for dialysis 3 times a week. - Nephrology team assistance appreciated - Nephrology team recommended fluids and repeating BMP in the morning to follow creatinine trend - Patient currently in no need of hemodialysis  Hypertension - Continue home medical regimen  Diabetes mellitus without complication ([place]) - Continue home medical regimen   Partial small bowel obstruction ([place]) - Thought to be ileus related to sepsis - Diet has been advanced on 01/13/2016 but patient developed one episode of vomiting in the evening - Patient was to stand the same diet, I have recommended changing to soft or clears if vomiting reoccurs - Abdominal x-ray also obtained for further evaluation    Status post recent cholecystectomy   DVT prophylaxis: SCD's Code Status: Full  Family Communication: Patient at bedside  Disposition Plan: Home in AM?  Consultants:   Nephrology   Procedures:   None   Antimicrobials:   Zosyn 01/09/2016 - 01/10/2016  Vancomycin 01/09/2016 1 dose  Rocephin 01/10/2016 - plan to change to ciprofloxacin upon discharge to complete therapy until 01/18/2016    Objective: Vitals:   01/14/16 1401 01/14/16 1626 01/14/16 2109 01/15/16 0555  BP: (!) 161/77  (!) 163/76 (!) 148/79  Pulse: 92  88 77  Resp:   16 17  Temp:   99.2 F (37.3 C) 98.3 F (36.8 C)  TempSrc:   Oral Oral  SpO2:  100% 99% 99%  Weight:    127 kg (280 lb)  Height:  Intake/Output Summary (Last 24 hours) at 01/15/16 1345 Last data filed at 01/15/16 0554  Gross per 24 hour  Intake           610.83 ml  Output              800 ml  Net          -189.17 ml   Filed Weights   01/11/16 1430 01/14/16 0524 01/15/16 0555  Weight: 125.2 kg (276 lb) 126 kg (277 lb 12.5 oz) 127 kg (280 lb)     Examination:  General exam: Appears calm and comfortable  Respiratory system: Clear to auscultation. Respiratory effort normal. Cardiovascular system: S1 & S2 heard, RRR. No JVD, murmurs, rubs, gallops or clicks.  Gastrointestinal system: Abdomen is nondistended, soft and nontender. No organomegaly or masses felt. Central nervous system: Alert and oriented. No focal neurological deficits.    Data Reviewed: I have personally reviewed following labs and imaging studies  CBC:  Recent Labs Lab 01/09/16 1654 01/10/16 0421 01/10/16 0500 01/11/16 0330 01/15/16 0607  WBC 2.2* 12.4* DUP SEE [id number] 10.7* 8.0  NEUTROABS  --  11.7* PENDING 10.3*  --   HGB 10.7* 9.4* DUP SEE [id number] 9.2* 8.1*  HCT 32.5* 28.0* DUP SEE [id number] 27.6* 24.5*  MCV 98.8 97.9 DUP SEE [id number] 98.6 93.9  PLT 226 201 DUP SEE [id number] 159 301   Basic Metabolic Panel:  Recent Labs Lab 01/11/16 0330 01/12/16 0542 01/13/16 0550 01/14/16 0553 01/15/16 0607  NA 135 135 137 138 138  K 4.6 4.5 4.2 4.2 4.5  CL 105 105 105 105 105  CO2 22 20* _0 GLUCOSE 120* 92 115* 95 93  BUN 50* 54* 58* 57* 61*  CREATININE 7.36* 7.32* 7.59* 7.89* 7.66*  CALCIUM 7.5* 7.8* 8.1* 8.2* 8.3*   Liver Function Tests:  Recent Labs Lab 01/09/16 1654 01/10/16 0133  AST 23 42*  ALT 23 32  ALKPHOS 91 66  BILITOT 0.7 1.3*  PROT 7.7 6.8  ALBUMIN 3.4* 3.0*    Recent Labs Lab 01/09/16 1654  LIPASE 53*  Coagulation Profile:  Recent Labs Lab 01/10/16 0133  INR 1.19   CBG:  Recent Labs Lab 01/11/16 0732 01/12/16 0747 01/13/16 0731 01/14/16 0733 01/15/16 0733  GLUCAP 118* 91 94 128* 87   Urine analysis:    Component Value Date/Time   COLORURINE YELLOW 01/14/2016 [place] 01/14/2016 1256   LABSPEC 1.014 01/14/2016 1256   PHURINE 6.0 01/14/2016 1256   GLUCOSEU NEGATIVE 01/14/2016 1256   HGBUR TRACE (A) 01/14/2016 1256   BILIRUBINUR NEGATIVE 01/14/2016 1256   KETONESUR NEGATIVE 01/14/2016  1256   PROTEINUR 100 (A) 01/14/2016 1256   UROBILINOGEN 0.2 06/24/2007 2040   NITRITE NEGATIVE 01/14/2016 1256   LEUKOCYTESUR NEGATIVE 01/14/2016 1256    Recent Results (from the past 240 hour(s))  Urine culture     Status: Abnormal   Collection Time: 01/09/16  3:59 PM  Result Value Ref Range Status   Specimen Description URINE, CLEAN CATCH  Final   Special Requests NONE  Final   Culture >=100,000 COLONIES/mL KLEBSIELLA PNEUMONIAE (A)  Final   Report Status 01/12/2016 FINAL  Final   Organism ID, Bacteria KLEBSIELLA PNEUMONIAE (A)  Final      Susceptibility   Klebsiella pneumoniae - MIC*    AMPICILLIN >=32 RESISTANT Resistant     CEFAZOLIN <=4 SENSITIVE Sensitive     CEFTRIAXONE <=1 SENSITIVE Sensitive     CIPROFLOXACIN <=0.25 SENSITIVE Sensitive  GENTAMICIN <=1 SENSITIVE Sensitive     IMIPENEM <=0.25 SENSITIVE Sensitive     NITROFURANTOIN 128 RESISTANT Resistant     TRIMETH/SULFA <=20 SENSITIVE Sensitive     AMPICILLIN/SULBACTAM >=32 RESISTANT Resistant     PIP/TAZO 8 SENSITIVE Sensitive     Extended ESBL NEGATIVE Sensitive     * >=100,000 COLONIES/mL KLEBSIELLA PNEUMONIAE  Culture, blood (routine x 2)     Status: Abnormal   Collection Time: 01/09/16  7:05 PM  Result Value Ref Range Status   Specimen Description BLOOD RIGHT ANTECUBITAL  Final   Special Requests BOTTLES DRAWN AEROBIC AND ANAEROBIC 5CC  Final   Culture  Setup Time   Final    GRAM NEGATIVE RODS IN BOTH AEROBIC AND ANAEROBIC BOTTLES CRITICAL RESULT CALLED TO, READ BACK BY AND VERIFIED WITH: C SHADE,PHARMD AT 1010 01/10/16 BY Ronnie Derby Performed at [place] (A)  Final   Report Status 01/12/2016 FINAL  Final   Organism ID, Bacteria KLEBSIELLA PNEUMONIAE  Final      Susceptibility   Klebsiella pneumoniae - MIC*    AMPICILLIN >=32 RESISTANT Resistant     CEFAZOLIN <=4 SENSITIVE Sensitive     CEFEPIME <=1 SENSITIVE Sensitive     CEFTAZIDIME <=1 SENSITIVE Sensitive      CEFTRIAXONE <=1 SENSITIVE Sensitive     CIPROFLOXACIN <=0.25 SENSITIVE Sensitive     GENTAMICIN <=1 SENSITIVE Sensitive     IMIPENEM 0.5 SENSITIVE Sensitive     TRIMETH/SULFA <=20 SENSITIVE Sensitive     AMPICILLIN/SULBACTAM >=32 RESISTANT Resistant     PIP/TAZO 8 SENSITIVE Sensitive     Extended ESBL NEGATIVE Sensitive     * KLEBSIELLA PNEUMONIAE  Blood Culture ID Panel (Reflexed)     Status: Abnormal   Collection Time: 01/09/16  7:05 PM  Result Value Ref Range Status   Enterococcus species NOT DETECTED NOT DETECTED Final   Vancomycin resistance NOT DETECTED NOT DETECTED Final   Listeria monocytogenes NOT DETECTED NOT DETECTED Final   Staphylococcus species NOT DETECTED NOT DETECTED Final   Staphylococcus aureus NOT DETECTED NOT DETECTED Final   Methicillin resistance NOT DETECTED NOT DETECTED Final   Streptococcus species NOT DETECTED NOT DETECTED Final   Streptococcus agalactiae NOT DETECTED NOT DETECTED Final   Streptococcus pneumoniae NOT DETECTED NOT DETECTED Final   Streptococcus pyogenes NOT DETECTED NOT DETECTED Final   Acinetobacter baumannii NOT DETECTED NOT DETECTED Final   Enterobacteriaceae species DETECTED (A) NOT DETECTED Final    Comment: CRITICAL RESULT CALLED TO, READ BACK BY AND VERIFIED WITH: C. Shade Pharm.D. 10:10 01/10/16 ([email or web address])    Enterobacter cloacae complex NOT DETECTED NOT DETECTED Final   Escherichia coli NOT DETECTED NOT DETECTED Final   Klebsiella oxytoca NOT DETECTED NOT DETECTED Final   Klebsiella pneumoniae DETECTED (A) NOT DETECTED Final    Comment: CRITICAL RESULT CALLED TO, READ BACK BY AND VERIFIED WITH: C. Shade Pharm.D. 10:10 01/10/16  ([email or web address])    Proteus species NOT DETECTED NOT DETECTED Final   Serratia marcescens NOT DETECTED NOT DETECTED Final   Carbapenem resistance NOT DETECTED NOT DETECTED Final   Haemophilus influenzae NOT DETECTED NOT DETECTED Final   Neisseria meningitidis NOT DETECTED NOT DETECTED Final   Pseudomonas  aeruginosa NOT DETECTED NOT DETECTED Final   Candida albicans NOT DETECTED NOT DETECTED Final   Candida glabrata NOT DETECTED NOT DETECTED Final   Candida krusei NOT DETECTED NOT DETECTED Final   Candida parapsilosis NOT DETECTED NOT DETECTED Final  Candida tropicalis NOT DETECTED NOT DETECTED Final  Culture, blood (routine x 2)     Status: Abnormal   Collection Time: 01/09/16  7:06 PM  Result Value Ref Range Status   Specimen Description BLOOD RIGHT HAND  Final   Special Requests IN PEDIATRIC BOTTLE 2CC  Final   Culture  Setup Time   Final    GRAM NEGATIVE RODS IN PEDIATRIC BOTTLE CRITICAL RESULT CALLED TO, READ BACK BY AND VERIFIED WITH: T SHADE,PHARMD AT 1010 01/10/16 BY M WILSON    Culture (A)  Final    KLEBSIELLA PNEUMONIAE SUSCEPTIBILITIES PERFORMED ON PREVIOUS CULTURE WITHIN THE LAST 5 DAYS. Performed at [facility]    Report Status 01/12/2016 FINAL  Final  MRSA PCR Screening     Status: None   Collection Time: 01/10/16 12:33 AM  Result Value Ref Range Status   MRSA by PCR NEGATIVE NEGATIVE Final    Comment:        The GeneXpert MRSA Assay (FDA approved for NASAL specimens only), is one component of a comprehensive MRSA colonization surveillance program. It is not intended to diagnose MRSA infection nor to guide or monitor treatment for MRSA infections.       Radiology Studies: Dg Abd Acute W/chest  Result Date: 01/14/2016 CLINICAL DATA:  Abdominal distention onset yesterday. History of multiple myeloma. EXAM: DG ABDOMEN ACUTE W/ 1V CHEST COMPARISON:  01/09/2016 FINDINGS: Mild cardiomegaly.  Lungs are clear.  No effusions or edema. Nonobstructive bowel gas pattern. No free air organomegaly. Prior cholecystectomy. Chronic deformity of the left hip and left pubic rami with large expansile lytic lesions. IMPRESSION: Mild cardiomegaly.  No acute cardiopulmonary disease. Prior cholecystectomy.  No obstruction or free air. Continued expansile lytic lesions in the  pelvis and deformity of the left hip compatible patient's history of multiple myeloma. Electronically Signed   By: Rolm Baptise M.D.   On: 01/14/2016 16:13      Scheduled Meds: . amLODipine  2.5 mg Oral Daily  . aspirin  325 mg Oral QHS  . calcitRIOL  0.5 mcg Oral Once per day on Mon Tue Wed Thu Fri  . calcitRIOL  0.5 mcg Oral 2 times per day on Sun Sat  . cefTRIAXone (ROCEPHIN)  IV  2 g Intravenous Q24H  . doxazosin  4 mg Oral QHS  . escitalopram  10 mg Oral QHS  . febuxostat  40 mg Oral Daily  . fluticasone  1 spray Each Nare Daily  . loratadine  10 mg Oral Daily  . multivitamin  1 tablet Oral Daily  . pantoprazole  40 mg Oral Daily  . pneumococcal 23 valent vaccine  0.5 mL Intramuscular Tomorrow-1000  . senna  1 tablet Oral QPM  . sodium bicarbonate  1,300 mg Oral QID  . sodium chloride flush  3 mL Intravenous Q12H   Continuous Infusions:     LOS: 6 days    Time spent: 25 minutes   [place], DO [facility] Pager [phone]  If 7PM-7AM, please contact night-coverage [email or web address] Password [email or web address] 01/15/2016, 1:45 PM

## 2016-01-16 ENCOUNTER — Inpatient Hospital Stay (HOSPITAL_COMMUNITY): Payer: Medicaid Other | Admitting: Anesthesiology

## 2016-01-16 ENCOUNTER — Encounter (HOSPITAL_COMMUNITY)
Admission: EM | Disposition: A | Discharge status: Home / Self Care | Payer: Self-pay | Source: Home / Self Care | Attending: Internal Medicine

## 2016-01-16 ENCOUNTER — Inpatient Hospital Stay (HOSPITAL_COMMUNITY): Payer: Medicaid Other

## 2016-01-16 ENCOUNTER — Encounter (HOSPITAL_COMMUNITY): Payer: Self-pay | Admitting: Radiology

## 2016-01-16 DIAGNOSIS — N1330 Unspecified hydronephrosis: Secondary | ICD-10-CM

## 2016-01-16 DIAGNOSIS — R509 Fever, unspecified: Secondary | ICD-10-CM

## 2016-01-16 HISTORY — PX: CYSTOSCOPY WITH STENT PLACEMENT: SHX5790

## 2016-01-16 LAB — CBC
HEMATOCRIT: 23.9 % — AB (ref 39.0–52.0)
HEMOGLOBIN: 8.1 g/dL — AB (ref 13.0–17.0)
MCH: 31.9 pg (ref 26.0–34.0)
MCHC: 33.9 g/dL (ref 30.0–36.0)
MCV: 94.1 fL (ref 78.0–100.0)
Platelets: 230 10*3/uL (ref 150–400)
RBC: 2.54 MIL/uL — AB (ref 4.22–5.81)
RDW: 16.8 % — ABNORMAL HIGH (ref 11.5–15.5)
WBC: 7.5 10*3/uL (ref 4.0–10.5)

## 2016-01-16 LAB — RENAL FUNCTION PANEL
ALBUMIN: 2.5 g/dL — AB (ref 3.5–5.0)
ANION GAP: 9 (ref 5–15)
Albumin: 2.4 g/dL — ABNORMAL LOW (ref 3.5–5.0)
Anion gap: 8 (ref 5–15)
BUN: 67 mg/dL — ABNORMAL HIGH (ref 6–20)
BUN: 65 mg/dL — AB (ref 6–20)
CALCIUM: 8.4 mg/dL — AB (ref 8.9–10.3)
CHLORIDE: 104 mmol/L (ref 101–111)
CO2: 23 mmol/L (ref 22–32)
CO2: 25 mmol/L (ref 22–32)
CREATININE: 7.54 mg/dL — AB (ref 0.61–1.24)
Calcium: 8.5 mg/dL — ABNORMAL LOW (ref 8.9–10.3)
Chloride: 105 mmol/L (ref 101–111)
Creatinine, Ser: 7.83 mg/dL — ABNORMAL HIGH (ref 0.61–1.24)
GFR calc Af Amer: 8 mL/min — ABNORMAL LOW (ref >60)
GFR calc non Af Amer: 7 mL/min — ABNORMAL LOW (ref >60)
GFR calc non Af Amer: 7 mL/min — ABNORMAL LOW (ref >60)
GFR, EST AFRICAN AMERICAN: 8 mL/min — AB (ref >60)
GLUCOSE: 85 mg/dL (ref 65–99)
Glucose, Bld: 105 mg/dL — ABNORMAL HIGH (ref 65–99)
PHOSPHORUS: 5.3 mg/dL — AB (ref 2.5–4.6)
POTASSIUM: 5.3 mmol/L — AB (ref 3.5–5.1)
Phosphorus: 4.5 mg/dL (ref 2.5–4.6)
Potassium: 4.3 mmol/L (ref 3.5–5.1)
Sodium: 137 mmol/L (ref 135–145)
Sodium: 137 mmol/L (ref 135–145)

## 2016-01-16 LAB — SURGICAL PCR SCREEN
MRSA, PCR: NEGATIVE
Staphylococcus aureus: NEGATIVE

## 2016-01-16 LAB — GLUCOSE, CAPILLARY
GLUCOSE-CAPILLARY: 88 mg/dL (ref 65–99)
Glucose-Capillary: 100 mg/dL — ABNORMAL HIGH (ref 65–99)

## 2016-01-16 LAB — PROCALCITONIN: Procalcitonin: 21.06 ng/mL

## 2016-01-16 SURGERY — CYSTOSCOPY, WITH STENT INSERTION
Anesthesia: General | Laterality: Right

## 2016-01-16 MED ORDER — FENTANYL CITRATE (PF) 100 MCG/2ML IJ SOLN
25.0000 ug | INTRAMUSCULAR | Status: DC | PRN
  Administered 2016-01-16 (×2): 25 ug via INTRAVENOUS

## 2016-01-16 MED ORDER — SUCCINYLCHOLINE CHLORIDE 20 MG/ML IJ SOLN
INTRAMUSCULAR | Status: DC | PRN
  Administered 2016-01-16: 100 mg via INTRAVENOUS

## 2016-01-16 MED ORDER — SODIUM CHLORIDE 0.9 % IV SOLN
INTRAVENOUS | Status: DC | PRN
  Administered 2016-01-16: 21:00:00 via INTRAVENOUS

## 2016-01-16 MED ORDER — FENTANYL CITRATE (PF) 100 MCG/2ML IJ SOLN
INTRAMUSCULAR | Status: AC
  Filled 2016-01-16: qty 2

## 2016-01-16 MED ORDER — PROPOFOL 10 MG/ML IV BOLUS
INTRAVENOUS | Status: DC | PRN
  Administered 2016-01-16: 200 mg via INTRAVENOUS

## 2016-01-16 MED ORDER — SODIUM CHLORIDE 0.9 % IV SOLN
INTRAVENOUS | Status: DC | PRN
  Administered 2016-01-16: 50 mL

## 2016-01-16 MED ORDER — PROMETHAZINE HCL 25 MG/ML IJ SOLN: 6.2500 mg | INTRAMUSCULAR | Status: DC | PRN

## 2016-01-16 MED ORDER — FENTANYL CITRATE (PF) 100 MCG/2ML IJ SOLN
INTRAMUSCULAR | Status: DC | PRN
  Administered 2016-01-16: 100 ug via INTRAVENOUS

## 2016-01-16 MED ORDER — SODIUM CHLORIDE 0.9 % IV SOLN
INTRAVENOUS | Status: DC
  Administered 2016-01-16 – 2016-01-17 (×2): via INTRAVENOUS

## 2016-01-16 MED ORDER — PROPOFOL 10 MG/ML IV BOLUS
INTRAVENOUS | Status: AC
  Filled 2016-01-16: qty 20

## 2016-01-16 MED ORDER — ONDANSETRON HCL 4 MG/2ML IJ SOLN
INTRAMUSCULAR | Status: AC
  Filled 2016-01-16: qty 2

## 2016-01-16 MED ORDER — DIATRIZOATE MEGLUMINE & SODIUM 66-10 % PO SOLN
30.0000 mL | Freq: Once | ORAL | Status: AC
  Administered 2016-01-16: 30 mL via ORAL

## 2016-01-16 MED ORDER — ONDANSETRON HCL 4 MG/2ML IJ SOLN
INTRAMUSCULAR | Status: DC | PRN
  Administered 2016-01-16: 4 mg via INTRAVENOUS

## 2016-01-16 MED ORDER — LIDOCAINE HCL (CARDIAC) 20 MG/ML IV SOLN
INTRAVENOUS | Status: AC
  Filled 2016-01-16: qty 5

## 2016-01-16 MED ORDER — CALCIUM CHLORIDE 10 % IV SOLN
INTRAVENOUS | Status: AC
  Filled 2016-01-16: qty 10

## 2016-01-16 MED ORDER — SODIUM CHLORIDE 0.9 % IR SOLN
Status: DC | PRN
  Administered 2016-01-16: 3000 mL via INTRAVESICAL

## 2016-01-16 MED ORDER — CIPROFLOXACIN IN D5W 400 MG/200ML IV SOLN
400.0000 mg | INTRAVENOUS | Status: DC
Start: 2016-01-16 — End: 2016-01-18
  Administered 2016-01-16 – 2016-01-17 (×2): 400 mg via INTRAVENOUS
  Filled 2016-01-16 (×2): qty 200

## 2016-01-16 MED ORDER — LIDOCAINE HCL (CARDIAC) 20 MG/ML IV SOLN
INTRAVENOUS | Status: DC | PRN
  Administered 2016-01-16: 100 mg via INTRATRACHEAL

## 2016-01-16 SURGICAL SUPPLY — 11 items
BAG URO CATCHER STRL LF (MISCELLANEOUS) ×3 IMPLANT
CATH INTERMIT  6FR 70CM (CATHETERS) ×3 IMPLANT
CLOTH BEACON ORANGE TIMEOUT ST (SAFETY) ×3 IMPLANT
GLOVE BIOGEL M STRL SZ7.5 (GLOVE) ×3 IMPLANT
GOWN STRL REUS W/TWL LRG LVL3 (GOWN DISPOSABLE) ×6 IMPLANT
GUIDEWIRE STR DUAL SENSOR (WIRE) ×3 IMPLANT
MANIFOLD NEPTUNE II (INSTRUMENTS) ×3 IMPLANT
PACK CYSTO (CUSTOM PROCEDURE TRAY) ×3 IMPLANT
STENT URET 6FRX26 CONTOUR (STENTS) ×2 IMPLANT
TUBING CONNECTING 10 (TUBING) ×2 IMPLANT
TUBING CONNECTING 10' (TUBING) ×1

## 2016-01-16 NOTE — Progress Notes (Signed)
Pharmacy Antibiotic Note  [name] is a 54 y.o. male with stage IV multiple myeloma being treated with Revlimid and CKD stage IV (not on HD), s/p gallbladder removal ~2 weeks ago,  presented to the ED on 8/13 with c/o chills and vomiting.  Found to be septic with acute pyelonephritis with Klebsiella bacteremia initially treated with Vancomycin (1 dose) and Zosyn (2 doses) then narrowed to Rocephin.  Now changing to Cipro per Nephrology's recommendation as Rocephin may be contributing to ATN.  Plan:  Cipro 432m IV q24h for CrCl < 30 ml/min  F/u repeat blood cultures  When ready to transition to PO, recommend 5073mPO q24h ________________________  Height: 6' 1"  (185.4 cm) Weight: 280 lb 9.6 oz (127.3 kg) IBW/kg (Calculated) : 79.9  Temp (24hrs), Avg:100.3 F (37.9 C), Min:99 F (37.2 C), Max:101.5 F (38.6 C)   Recent Labs Lab 01/09/16 2213 01/10/16 0133 01/10/16 0421 01/10/16 0500 01/10/16 0951  01/10/16 1254 01/11/16 0330 01/12/16 0542 01/13/16 0550 01/14/16 0553 01/15/16 0607 01/16/16 0527  WBC  --   --  12.4* DUP SEE [id number]--   --   --  10.7*  --   --   --  8.0 7.5  CREATININE  --  6.10* 6.34*  --   --   < >  --  7.36* 7.32* 7.59* 7.89* 7.66* 7.83*  LATICACIDVEN 2.74* 3.3* 2.6*  --  1.6  --  1.3  --   --   --   --   --   --   < > = values in this interval not displayed.  Estimated Creatinine Clearance: 14.9 mL/min (by C-G formula based on SCr of 7.83 mg/dL).    Allergies  Allergen Reactions  . Tape Rash    Paper   Antimicrobials this admission: 8/13 vanc >> 8/13 8/14 Zosyn >> 8/14 8/14 CTX >> 8/20 8/20 Cipro >>  Levels/dose changes this admission: ---  Microbiology results: 8/13 BCx x2: klebsiella pneumo (R amp, unasyn only) 8/13 UCx: >100k klebsiella pneumo (R amp, unasyn, nitro only) 8/14 MRSA PCR: negative 8/19 BCx: sent  Thank you for allowing pharmacy to be a part of this patient's care.  ErPeggyann JubaPharmD, BCPS Pager:  [phone]/20/2017 9:03 AM

## 2016-01-16 NOTE — Transfer of Care (Signed)
Immediate Anesthesia Transfer of Care Note  Patient: [name]  Procedure(s) Performed: Procedure(s): CYSTOSCOPY WITH STENT PLACEMENT (Right)  Patient Location: PACU  Anesthesia Type:General  Level of Consciousness: awake, alert  and patient cooperative  Airway & Oxygen Therapy: Patient Spontanous Breathing and Patient connected to face mask oxygen  Post-op Assessment: Report given to RN and Post -op Vital signs reviewed and stable  Post vital signs: Reviewed and stable  Last Vitals:  Vitals:   01/16/16 0708 01/16/16 1622  BP:  (!) 150/72  Pulse:  78  Resp:  18  Temp: 37.4 C 37.4 C    Last Pain:  Vitals:   01/16/16 1622  TempSrc: Oral  PainSc:       Patients Stated Pain Goal: 2 ([id number])  Complications: No apparent anesthesia complications

## 2016-01-16 NOTE — Progress Notes (Signed)
Took over from Brook-RN, no new changes noted, patient denies any pain/distress. Will continue to monitor patient.

## 2016-01-16 NOTE — Anesthesia Procedure Notes (Signed)
Procedure Name: Intubation Date/Time: 01/16/2016 9:22 PM Performed by: Dione Booze Pre-anesthesia Checklist: Patient identified, Emergency Drugs available, Suction available and Patient being monitored Patient Re-evaluated:Patient Re-evaluated prior to inductionOxygen Delivery Method: Circle system utilized Preoxygenation: Pre-oxygenation with 100% oxygen Intubation Type: IV induction, Rapid sequence and Cricoid Pressure applied Grade View: Grade II Tube type: Oral Tube size: 7.5 mm Number of attempts: 1 Airway Equipment and Method: Stylet Placement Confirmation: ETT inserted through vocal cords under direct vision,  positive ETCO2 and breath sounds checked- equal and bilateral Secured at: 23 cm Tube secured with: Tape Dental Injury: Teeth and Oropharynx as per pre-operative assessment

## 2016-01-16 NOTE — Op Note (Signed)
Preoperative diagnosis:  1. Right hydronephrosis 2. Bacteremia  3. Renal failure  Postoperative diagnosis:  1. Right hydronephrosis  2. Bacteremia 3. Renal failure  Procedure:  1. Cystoscopy 2. Right ureteral stent placement (6 x26) 3. Right retrograde pyelography  Surgeon: Pryor Curia. M.D.  Anesthesia: General  Complications: None  Intraoperative findings: Right retrograde pyelography was performed with a 6 Fr ureteral catheter and Omnipaque contrast.  This demonstrated a filling defect in the proximal right ureter.   EBL: Minimal  Specimens: None  Indication: [name] is a 55 y.o. patient seen by Dr. Karsten Ro as a consult earlier today.  He was admitted with sepsis and has been treated for Klebsiella bacteremia and urinary tract infection.  He was noted to have right hydronephrosis on admission with an obvious stone or other source of obstruction. He has had acute on chronic renal failure that has not improved and he has remained with clinical concern of unresolving infection.  It was decided to proceed with right ureteral stent placement.  However, the patient had eaten and his procedure was delayed until this evening. After reviewing the management options for treatment, he elected to proceed with the above surgical procedure(s). We have discussed the potential benefits and risks of the procedure, side effects of the proposed treatment, the likelihood of the patient achieving the goals of the procedure, and any potential problems that might occur during the procedure or recuperation. Informed consent has been obtained.  Description of procedure:  The patient was taken to the operating room and general anesthesia was induced. IV access was extremely difficult. The patient was placed in the dorsal lithotomy position, prepped and draped in the usual sterile fashion, and preoperative antibiotics were administered. A preoperative time-out was performed.    Cystourethroscopy was performed.  The patient's urethra was examined and was unremarkable. The bladder was then systematically examined in its entirety. There was no evidence for any bladder tumors, stones, or other mucosal pathology.    Attention then turned to the right ureteral orifice.  A retrograde pyelography was performed after the right ureteral orifice was intubated with a 6 Fr ureteral catheter.  Findings are as dictated above. There was a right proximal ureteral filling defect.   A 0.38 sensor guidewire was then advanced up the right ureter into the renal pelvis under fluoroscopic guidance.  The wire was then backloaded through the cystoscope and a ureteral stent was advance over the wire using Seldinger technique.  The stent was positioned appropriately under fluoroscopic and cystoscopic guidance.  The wire was then removed with an adequate stent curl noted in the renal pelvis as well as in the bladder.  The bladder was then emptied and the procedure ended.  The patient appeared to tolerate the procedure well and without complications.  The patient was able to be awakened and transferred to the recovery unit in satisfactory condition.    Pryor Curia MD

## 2016-01-16 NOTE — Progress Notes (Signed)
Patient ID: [name], male   DOB: [birth year], 55 y.o.   MRN: [id number]    PROGRESS NOTE    [name]  YWV:[id number] DOB: 05-Dec-1960 DOA: 01/09/2016  PCP: Pcp Not In System   Brief Narrative  55 year old male with history of multiple myeloma(On Revlimid, follows with oncologist in [facility]), hypertension, GERD, came to the ED with nausea, vomiting, abdominal pain and increased urinary frequency.  Patient found to be septic with fever of 102.41F, elevated lactic acid and neutropenia. 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  UA positive for UTI. CT of the abdomen and pelvis showed right hydronephrosis with perinephric and proximal periureteral stranding. Mild to moderate left perinephric stranding without hydronephrosis. Also commented on early versus partial small bowel obstruction.   Subjective:  fever again yesterday   Assessment &Plan :   Sepsis ([place]) Secondary to acute pyelonephritis, Klebsiella UTI, Klebsiella bacteremia - Blood cultures and urine cultures reviewed, positive for Klebsiella -repeat blood cultures -IV Rocephin change to cipro - Continue to monitor fever curve  Fever -see above -repeat CT scan of abd/pelvis  Multiple myeloma ([place]) - follows with oncologist in [facility]. Holding revlimid   Acute on CKD (chronic kidney disease), stage V ([place]) - Progressive with creatinine  Spoke with his nephrologist nephrologist in [facility] ( Dr Cay Schillings, [phone])  - Patient was on dialysis until 2013 and was being monitored thereafter. He has a functioning AV fistula with baseline creatinine all 4-5. She recommends that patient would need dialysis. Also that patient lives in [place] and if he requires regular dialysis it might be difficult for him to take a bus to go to [place] for dialysis 3 times a week. - Nephrology team assistance appreciated- may need transfer to  [facility] For dialysis if not improved- discussed with Dr. Loletha Grayer  Hypertension - Continue home medical regimen  Diabetes mellitus without complication ([place]) - Continue home medical regimen  Partial small bowel obstruction ([place]) - resolved, eating well    Status post recent cholecystectomy   DVT prophylaxis: SCD's Code Status: Full  Family Communication: Patient at bedside  Disposition Plan:   Consultants:   Nephrology   Procedures:   None   Antimicrobials:   Zosyn 01/09/2016 - 01/10/2016  Vancomycin 01/09/2016 1 dose Rocephin 01/10/2016 - 8/20 cipro   Objective: Vitals:   01/15/16 2024 01/15/16 2126 01/16/16 0614 01/16/16 0708  BP: (!) 149/69  (!) 165/77   Pulse: 88  80   Resp: 16  18   Temp: (!) 101.5 F (38.6 C) 99.9 F (37.7 C) 99 F (37.2 C) 99.3 F (37.4 C)  TempSrc: Oral Oral Oral Oral  SpO2: 98%  100%   Weight:   127.3 kg (280 lb 9.6 oz)   Height:        Intake/Output Summary (Last 24 hours) at 01/16/16 0850 Last data filed at 01/16/16 0828  Gross per 24 hour  Intake                0 ml  Output             1650 ml  Net            -1650 ml   Filed Weights   01/14/16 0524 01/15/16 0555 01/16/16 0614  Weight: 126 kg (277 lb 12.5 oz) 127 kg (280 lb) 127.3 kg (280 lb 9.6 oz)    Examination:  General exam: Appears calm and  comfortable  Respiratory system: Clear to auscultation. Respiratory effort normal. Cardiovascular system: S1 & S2 heard, RRR. No JVD, murmurs, rubs, gallops or clicks.  Gastrointestinal system: Abdomen is nondistended, soft and nontender. No organomegaly or masses felt. Central nervous system: Alert and oriented. No focal neurological deficits.    Data Reviewed: I have personally reviewed following labs and imaging studies  CBC:  Recent Labs Lab 01/10/16 0421 01/10/16 0500 01/11/16 0330 01/15/16 0607 01/16/16 0527  WBC 12.4* DUP SEE [id number] 10.7* 8.0 7.5  NEUTROABS 11.7* PENDING 10.3*  --   --   HGB 9.4* DUP SEE  [id number] 9.2* 8.1* 8.1*  HCT 28.0* DUP SEE [id number] 27.6* 24.5* 23.9*  MCV 97.9 DUP SEE [id number] 98.6 93.9 94.1  PLT 201 DUP SEE [id number] 159 170 527   Basic Metabolic Panel:  Recent Labs Lab 01/12/16 0542 01/13/16 0550 01/14/16 0553 01/15/16 0607 01/16/16 0527  NA 135 137 138 138 137  K 4.5 4.2 4.2 4.5 5.3*  CL 105 105 105 105 105  CO2 20* 24 24 23 23   GLUCOSE 92 115* 95 93 85  BUN 54* 58* 57* 61* 67*  CREATININE 7.32* 7.59* 7.89* 7.66* 7.83*  CALCIUM 7.8* 8.1* 8.2* 8.3* 8.4*  PHOS  --   --   --   --  5.3*   Liver Function Tests:  Recent Labs Lab 01/09/16 1654 01/10/16 0133 01/16/16 0527  AST 23 42*  --   ALT 23 32  --   ALKPHOS 91 66  --   BILITOT 0.7 1.3*  --   PROT 7.7 6.8  --   ALBUMIN 3.4* 3.0* 2.5*    Recent Labs Lab 01/09/16 1654  LIPASE 53*  Coagulation Profile:  Recent Labs Lab 01/10/16 0133  INR 1.19   CBG:  Recent Labs Lab 01/12/16 0747 01/13/16 0731 01/14/16 0733 01/15/16 0733 01/16/16 0732  GLUCAP 91 94 128* 87 88   Urine analysis:    Component Value Date/Time   COLORURINE YELLOW 01/14/2016 1256   APPEARANCEUR CLEAR 01/14/2016 1256   LABSPEC 1.014 01/14/2016 1256   PHURINE 6.0 01/14/2016 1256   GLUCOSEU NEGATIVE 01/14/2016 1256   HGBUR TRACE (A) 01/14/2016 1256   BILIRUBINUR NEGATIVE 01/14/2016 1256   KETONESUR NEGATIVE 01/14/2016 1256   PROTEINUR 100 (A) 01/14/2016 1256   UROBILINOGEN 0.2 06/24/2007 2040   NITRITE NEGATIVE 01/14/2016 1256   LEUKOCYTESUR NEGATIVE 01/14/2016 1256    Recent Results (from the past 240 hour(s))  Urine culture     Status: Abnormal   Collection Time: 01/09/16  3:59 PM  Result Value Ref Range Status   Specimen Description URINE, CLEAN CATCH  Final   Special Requests NONE  Final   Culture >=100,000 COLONIES/mL KLEBSIELLA PNEUMONIAE (A)  Final   Report Status 01/12/2016 FINAL  Final   Organism ID, Bacteria KLEBSIELLA PNEUMONIAE (A)  Final      Susceptibility   Klebsiella pneumoniae - MIC*     AMPICILLIN >=32 RESISTANT Resistant     CEFAZOLIN <=4 SENSITIVE Sensitive     CEFTRIAXONE <=1 SENSITIVE Sensitive     CIPROFLOXACIN <=0.25 SENSITIVE Sensitive     GENTAMICIN <=1 SENSITIVE Sensitive     IMIPENEM <=0.25 SENSITIVE Sensitive     NITROFURANTOIN 128 RESISTANT Resistant     TRIMETH/SULFA <=20 SENSITIVE Sensitive     AMPICILLIN/SULBACTAM >=32 RESISTANT Resistant     PIP/TAZO 8 SENSITIVE Sensitive     Extended ESBL NEGATIVE Sensitive     * >=100,000 COLONIES/mL KLEBSIELLA PNEUMONIAE  Culture, blood (  routine x 2)     Status: Abnormal   Collection Time: 01/09/16  7:05 PM  Result Value Ref Range Status   Specimen Description BLOOD RIGHT ANTECUBITAL  Final   Special Requests BOTTLES DRAWN AEROBIC AND ANAEROBIC 5CC  Final   Culture  Setup Time   Final    GRAM NEGATIVE RODS IN BOTH AEROBIC AND ANAEROBIC BOTTLES CRITICAL RESULT CALLED TO, READ BACK BY AND VERIFIED WITH: C SHADE,PHARMD AT 1010 01/10/16 BY Ronnie Derby Performed at [place] (A)  Final   Report Status 01/12/2016 FINAL  Final   Organism ID, Bacteria KLEBSIELLA PNEUMONIAE  Final      Susceptibility   Klebsiella pneumoniae - MIC*    AMPICILLIN >=32 RESISTANT Resistant     CEFAZOLIN <=4 SENSITIVE Sensitive     CEFEPIME <=1 SENSITIVE Sensitive     CEFTAZIDIME <=1 SENSITIVE Sensitive     CEFTRIAXONE <=1 SENSITIVE Sensitive     CIPROFLOXACIN <=0.25 SENSITIVE Sensitive     GENTAMICIN <=1 SENSITIVE Sensitive     IMIPENEM 0.5 SENSITIVE Sensitive     TRIMETH/SULFA <=20 SENSITIVE Sensitive     AMPICILLIN/SULBACTAM >=32 RESISTANT Resistant     PIP/TAZO 8 SENSITIVE Sensitive     Extended ESBL NEGATIVE Sensitive     * KLEBSIELLA PNEUMONIAE  Blood Culture ID Panel (Reflexed)     Status: Abnormal   Collection Time: 01/09/16  7:05 PM  Result Value Ref Range Status   Enterococcus species NOT DETECTED NOT DETECTED Final   Vancomycin resistance NOT DETECTED NOT DETECTED Final   Listeria  monocytogenes NOT DETECTED NOT DETECTED Final   Staphylococcus species NOT DETECTED NOT DETECTED Final   Staphylococcus aureus NOT DETECTED NOT DETECTED Final   Methicillin resistance NOT DETECTED NOT DETECTED Final   Streptococcus species NOT DETECTED NOT DETECTED Final   Streptococcus agalactiae NOT DETECTED NOT DETECTED Final   Streptococcus pneumoniae NOT DETECTED NOT DETECTED Final   Streptococcus pyogenes NOT DETECTED NOT DETECTED Final   Acinetobacter baumannii NOT DETECTED NOT DETECTED Final   Enterobacteriaceae species DETECTED (A) NOT DETECTED Final    Comment: CRITICAL RESULT CALLED TO, READ BACK BY AND VERIFIED WITH: C. Shade Pharm.D. 10:10 01/10/16 ([email or web address])    Enterobacter cloacae complex NOT DETECTED NOT DETECTED Final   Escherichia coli NOT DETECTED NOT DETECTED Final   Klebsiella oxytoca NOT DETECTED NOT DETECTED Final   Klebsiella pneumoniae DETECTED (A) NOT DETECTED Final    Comment: CRITICAL RESULT CALLED TO, READ BACK BY AND VERIFIED WITH: C. Shade Pharm.D. 10:10 01/10/16  ([email or web address])    Proteus species NOT DETECTED NOT DETECTED Final   Serratia marcescens NOT DETECTED NOT DETECTED Final   Carbapenem resistance NOT DETECTED NOT DETECTED Final   Haemophilus influenzae NOT DETECTED NOT DETECTED Final   Neisseria meningitidis NOT DETECTED NOT DETECTED Final   Pseudomonas aeruginosa NOT DETECTED NOT DETECTED Final   Candida albicans NOT DETECTED NOT DETECTED Final   Candida glabrata NOT DETECTED NOT DETECTED Final   Candida krusei NOT DETECTED NOT DETECTED Final   Candida parapsilosis NOT DETECTED NOT DETECTED Final   Candida tropicalis NOT DETECTED NOT DETECTED Final  Culture, blood (routine x 2)     Status: Abnormal   Collection Time: 01/09/16  7:06 PM  Result Value Ref Range Status   Specimen Description BLOOD RIGHT HAND  Final   Special Requests IN PEDIATRIC BOTTLE 2CC  Final   Culture  Setup Time   Final    GRAM  NEGATIVE RODS IN PEDIATRIC BOTTLE CRITICAL  RESULT CALLED TO, READ BACK BY AND VERIFIED WITH: T SHADE,PHARMD AT 1010 01/10/16 BY M WILSON    Culture (A)  Final    KLEBSIELLA PNEUMONIAE SUSCEPTIBILITIES PERFORMED ON PREVIOUS CULTURE WITHIN THE LAST 5 DAYS. Performed at [facility]    Report Status 01/12/2016 FINAL  Final  MRSA PCR Screening     Status: None   Collection Time: 01/10/16 12:33 AM  Result Value Ref Range Status   MRSA by PCR NEGATIVE NEGATIVE Final    Comment:        The GeneXpert MRSA Assay (FDA approved for NASAL specimens only), is one component of a comprehensive MRSA colonization surveillance program. It is not intended to diagnose MRSA infection nor to guide or monitor treatment for MRSA infections.       Radiology Studies: Dg Abd Acute W/chest  Result Date: 01/14/2016 CLINICAL DATA:  Abdominal distention onset yesterday. History of multiple myeloma. EXAM: DG ABDOMEN ACUTE W/ 1V CHEST COMPARISON:  01/09/2016 FINDINGS: Mild cardiomegaly.  Lungs are clear.  No effusions or edema. Nonobstructive bowel gas pattern. No free air organomegaly. Prior cholecystectomy. Chronic deformity of the left hip and left pubic rami with large expansile lytic lesions. IMPRESSION: Mild cardiomegaly.  No acute cardiopulmonary disease. Prior cholecystectomy.  No obstruction or free air. Continued expansile lytic lesions in the pelvis and deformity of the left hip compatible patient's history of multiple myeloma. Electronically Signed   By: Rolm Baptise M.D.   On: 01/14/2016 16:13      Scheduled Meds: . amLODipine  2.5 mg Oral Daily  . aspirin  325 mg Oral QHS  . calcitRIOL  0.5 mcg Oral Once per day on Mon Tue Wed Thu Fri  . calcitRIOL  0.5 mcg Oral 2 times per day on Sun Sat  . doxazosin  4 mg Oral QHS  . escitalopram  10 mg Oral QHS  . febuxostat  40 mg Oral Daily  . fluticasone  1 spray Each Nare Daily  . loratadine  10 mg Oral Daily  . multivitamin  1 tablet Oral Daily  . pantoprazole  40 mg Oral Daily  .  pneumococcal 23 valent vaccine  0.5 mL Intramuscular Tomorrow-1000  . senna  1 tablet Oral QPM  . sodium bicarbonate  1,300 mg Oral QID  . sodium chloride flush  3 mL Intravenous Q12H   Continuous Infusions: . sodium chloride       LOS: 7 days    Time spent: 25 minutes   Davis, DO [facility] Pager [phone]  If 7PM-7AM, please contact night-coverage [email or web address] Password [email or web address] 01/16/2016, 8:50 AM

## 2016-01-16 NOTE — Anesthesia Preprocedure Evaluation (Addendum)
Anesthesia Evaluation  Patient identified by MRN, date of birth, ID band Patient awake    Reviewed: Allergy & Precautions, NPO status , Patient's Chart, lab work & pertinent test results  Airway Mallampati: II  TM Distance: >3 FB Neck ROM: Full    Dental no notable dental hx.    Pulmonary neg pulmonary ROS,    Pulmonary exam normal        Cardiovascular hypertension, Pt. on medications Normal cardiovascular exam     Neuro/Psych negative neurological ROS  negative psych ROS   GI/Hepatic GERD  ,  Endo/Other  diabetes, Type 2  Renal/GU CRFRenal disease (stage 4-5)     Musculoskeletal negative musculoskeletal ROS (+)   Abdominal   Peds  Hematology  (+) Blood dyscrasia (Multiple Myeloma), ,   Anesthesia Other Findings Urosepsis  Reproductive/Obstetrics                            Anesthesia Physical Anesthesia Plan  ASA: III  Anesthesia Plan: General   Post-op Pain Management:    Induction: Rapid sequence, Intravenous and Cricoid pressure planned  Airway Management Planned: Oral ETT  Additional Equipment:   Intra-op Plan:   Post-operative Plan: Extubation in OR  Informed Consent: I have reviewed the patients History and Physical, chart, labs and discussed the procedure including the risks, benefits and alternatives for the proposed anesthesia with the patient or authorized representative who has indicated his/her understanding and acceptance.   Dental advisory given  Plan Discussed with:   Anesthesia Plan Comments:        Anesthesia Quick Evaluation

## 2016-01-16 NOTE — Progress Notes (Signed)
Patient ID: [name], male   DOB: 1960/09/17, 55 y.o.   MRN: [id number] S: Had fever overnight and recurrent N/V with worsening K and Scr O:BP (!) 165/77 (BP Location: Right Arm)   Pulse 80   Temp 99.3 F (37.4 C) (Oral)   Resp 18   Ht 6' 1" (1.854 m)   Wt 127.3 kg (280 lb 9.6 oz)   SpO2 100%   BMI 37.02 kg/m   Intake/Output Summary (Last 24 hours) at 01/16/16 0841 Last data filed at 01/16/16 8366  Gross per 24 hour  Intake                0 ml  Output             1650 ml  Net            -1650 ml   Intake/Output: I/O last 3 completed shifts: In: 250 [I.V.:200; IV Piggyback:50] Out: 2947 [Urine:2350]  Intake/Output this shift:  Total I/O In: -  Out: 200 [Urine:200] Weight change: 0.272 kg (9.6 oz) MLY:YTKPT AAM in NAD CVS:no rub Resp:cta Abd:RUQ Tenderness, +bs Ext:no edema   Recent Labs Lab 01/09/16 1654 01/10/16 0133  01/10/16 0955 01/11/16 0330 01/12/16 0542 01/13/16 0550 01/14/16 0553 01/15/16 0607 01/16/16 0527  NA 137 133*  < > 137 135 135 137 138 138 137  K 5.6* 5.8*  < > 5.6* 4.6 4.5 4.2 4.2 4.5 5.3*  CL 103 104  < > 108 105 105 105 105 105 105  CO2 26 23  < > 23 22 20* _0 GLUCOSE 100* 156*  < > 88 120* 92 115* 95 93 85  BUN 37* 38*  < > 44* 50* 54* 58* 57* 61* 67*  CREATININE 5.62* 6.10*  < > 6.79* 7.36* 7.32* 7.59* 7.89* 7.66* 7.83*  ALBUMIN 3.4* 3.0*  --   --   --   --   --   --   --  2.5*  CALCIUM 8.7* 7.8*  < > 7.9* 7.5* 7.8* 8.1* 8.2* 8.3* 8.4*  PHOS  --   --   --   --   --   --   --   --   --  5.3*  AST 23 42*  --   --   --   --   --   --   --   --   ALT 23 32  --   --   --   --   --   --   --   --   < > = values in this interval not displayed. Liver Function Tests:  Recent Labs Lab 01/09/16 1654 01/10/16 0133 01/16/16 0527  AST 23 42*  --   ALT 23 32  --   ALKPHOS 91 66  --   BILITOT 0.7 1.3*  --   PROT 7.7 6.8  --   ALBUMIN 3.4* 3.0* 2.5*    Recent Labs Lab 01/09/16 1654  LIPASE 53*   No results for  input(s): AMMONIA in the last 168 hours. CBC:  Recent Labs Lab 01/10/16 0421 01/10/16 0500 01/11/16 0330 01/15/16 0607 01/16/16 0527  WBC 12.4* DUP SEE [id number] 10.7* 8.0 7.5  NEUTROABS 11.7* PENDING 10.3*  --   --   HGB 9.4* DUP SEE [id number] 9.2* 8.1* 8.1*  HCT 28.0* DUP SEE [id number] 27.6* 24.5* 23.9*  MCV 97.9 DUP SEE [id number] 98.6 93.9 94.1  PLT 201 DUP SEE [id number] 159 170 230  Cardiac Enzymes: No results for input(s): CKTOTAL, CKMB, CKMBINDEX, TROPONINI in the last 168 hours. CBG:  Recent Labs Lab 01/12/16 0747 01/13/16 0731 01/14/16 0733 01/15/16 0733 01/16/16 0732  GLUCAP 91 94 128* 87 88    Iron Studies: No results for input(s): IRON, TIBC, TRANSFERRIN, FERRITIN in the last 72 hours. Studies/Results: Dg Abd Acute W/chest  Result Date: 01/14/2016 CLINICAL DATA:  Abdominal distention onset yesterday. History of multiple myeloma. EXAM: DG ABDOMEN ACUTE W/ 1V CHEST COMPARISON:  01/09/2016 FINDINGS: Mild cardiomegaly.  Lungs are clear.  No effusions or edema. Nonobstructive bowel gas pattern. No free air organomegaly. Prior cholecystectomy. Chronic deformity of the left hip and left pubic rami with large expansile lytic lesions. IMPRESSION: Mild cardiomegaly.  No acute cardiopulmonary disease. Prior cholecystectomy.  No obstruction or free air. Continued expansile lytic lesions in the pelvis and deformity of the left hip compatible patient's history of multiple myeloma. Electronically Signed   By: Rolm Baptise M.D.   On: 01/14/2016 16:13   . amLODipine  2.5 mg Oral Daily  . aspirin  325 mg Oral QHS  . calcitRIOL  0.5 mcg Oral Once per day on Mon Tue Wed Thu Fri  . calcitRIOL  0.5 mcg Oral 2 times per day on Sun Sat  . cefTRIAXone (ROCEPHIN)  IV  2 g Intravenous Q24H  . doxazosin  4 mg Oral QHS  . escitalopram  10 mg Oral QHS  . febuxostat  40 mg Oral Daily  . fluticasone  1 spray Each Nare Daily  . loratadine  10 mg Oral Daily  . multivitamin  1 tablet Oral Daily  .  pantoprazole  40 mg Oral Daily  . pneumococcal 23 valent vaccine  0.5 mL Intramuscular Tomorrow-1000  . senna  1 tablet Oral QPM  . sodium bicarbonate  1,300 mg Oral QID  . sodium chloride flush  3 mL Intravenous Q12H    BMET    Component Value Date/Time   NA 137 01/16/2016 0527   K 5.3 (H) 01/16/2016 0527   CL 105 01/16/2016 0527   CO2 23 01/16/2016 0527   GLUCOSE 85 01/16/2016 0527   BUN 67 (H) 01/16/2016 0527   CREATININE 7.83 (H) 01/16/2016 0527   CALCIUM 8.4 (L) 01/16/2016 0527   CALCIUM 7.2 (L) 07/16/2007 1456   GFRNONAA 7 (L) 01/16/2016 0527   GFRAA 8 (L) 01/16/2016 0527   CBC    Component Value Date/Time   WBC 7.5 01/16/2016 0527   RBC 2.54 (L) 01/16/2016 0527   HGB 8.1 (L) 01/16/2016 0527   HCT 23.9 (L) 01/16/2016 0527   PLT 230 01/16/2016 0527   MCV 94.1 01/16/2016 0527   MCH 31.9 01/16/2016 0527   MCHC 33.9 01/16/2016 0527   RDW 16.8 (H) 01/16/2016 0527   LYMPHSABS 0.2 (L) 01/11/2016 0330   MONOABS 0.2 01/11/2016 0330   EOSABS 0.0 01/11/2016 0330   BASOSABS 0.0 01/11/2016 0330     Assessment/Plan: 1. AKI/CKD stage 4- in setting of pyelonephritis/GNR bacteremia. Given on dose of vanco initially and zosyn switched to rocephin 2 grams for 4 tx so far. Scr continues to rise. had some N/V. 1. Improved with gentle IVF's but after stopping Scr rose again.  Restart IVF's and recommend stopping ceftriaxone and switching to cipro in case this is AIN 2. Agree with re-imaging kidneys 3. FeNa c/w ATN 4. If renal function does not improve over the next 24 hours will transfer to [facility] for initiation of HD. 5. Renal dose meds. 2.  GNR bacteremia- on ceftriaxone  3. RUQ pain- agree with re-imaging abdomen 4. hyperkalemia- will restart IVF's and recheck later today  5. 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 Baseline Scr in the 5's now has been climbing. Cont to follow 6. Multiple myeloma- on revlimid 7. Metabolic acidosis- on bicarb 8. Anemia of chronic kidney  disease- 9. HTN 10. Vascular access LUE AVF mature 11. Disposition- follows Dr. Joesph July as an outpatient and will need to see her soon after discharge.  From renal standpoint, would like to see Scr improve off of IVF"s before discharge.  Also need to consider CIR for debilitation and rehab needs vs. HHC.  Will defer to primary team.   [name]

## 2016-01-16 NOTE — Consult Note (Signed)
Urology Consult   Physician requesting consult: Dr. Eulogio Bear  Reason for consult: right hydronephrosis  History of Present Illness: [name] is a 55 y.o. with medical history significant of multiple myeloma, hypertension, GERD, gout, depression, CKD-V who presents on 01/09/16 with abdominal pain, vomiting and fevers. He was found to have sepsis from Klebsiella UTI in addition to mild to moderate right hydronephrosis on CT imaging which is persistent on repeat imaging today despite decompressed bladder. He initially defervesced on Rocephin but spiked another fever to 101.62F yesterday while on Cipro despite sensitivity to Cipro on culture. Creatinine has risen from 5.6 to 7.8. Overall, he feels better with improved pain and nausea since admission.  He denies a history of  hematuria, UTIs, STDs, urolithiasis, GU surgery.  Past Medical History:  Diagnosis Date  . Diabetes mellitus without complication ([place])   . Hypertension   . Multiple myeloma ([place])   . Renal disorder     Past Surgical History:  Procedure Laterality Date  . CHOLECYSTECTOMY    . DIALYSIS FISTULA CREATION    . knee Left     Current Hospital Medications:  Scheduled Meds: . amLODipine  2.5 mg Oral Daily  . aspirin  325 mg Oral QHS  . calcitRIOL  0.5 mcg Oral Once per day on Mon Tue Wed Thu Fri  . calcitRIOL  0.5 mcg Oral 2 times per day on Sun Sat  . ciprofloxacin  400 mg Intravenous Q24H  . doxazosin  4 mg Oral QHS  . escitalopram  10 mg Oral QHS  . febuxostat  40 mg Oral Daily  . fluticasone  1 spray Each Nare Daily  . loratadine  10 mg Oral Daily  . multivitamin  1 tablet Oral Daily  . pantoprazole  40 mg Oral Daily  . pneumococcal 23 valent vaccine  0.5 mL Intramuscular Tomorrow-1000  . senna  1 tablet Oral QPM  . sodium bicarbonate  1,300 mg Oral QID  . sodium chloride flush  3 mL Intravenous Q12H   Continuous Infusions: . sodium chloride 50 mL/hr at 01/16/16 0905   PRN Meds:.acetaminophen  **OR** acetaminophen, hydrALAZINE, HYDROcodone-acetaminophen, ipratropium-albuterol, ondansetron (ZOFRAN) IV  Allergies:  Allergies  Allergen Reactions  . Tape Rash    Paper    Family History  Problem Relation Age of Onset  . Hypertension Mother   . Hypertension Father   . Multiple myeloma Brother     Social History:  reports that he has never smoked. He has never used smokeless tobacco. He reports that he does not drink alcohol or use drugs.  ROS: A complete review of systems was performed.  All systems are negative except for pertinent findings as noted.  Physical Exam:  Vital signs in last 24 hours: Temp:  [99 F (37.2 C)-101.5 F (38.6 C)] 99.3 F (37.4 C) (08/20 0708) Pulse Rate:  [80-88] 80 (08/20 0614) Resp:  [16-18] 18 (08/20 0614) BP: (149-165)/(69-77) 165/77 (08/20 0614) SpO2:  [98 %-100 %] 100 % (08/20 0614) Weight:  [127.3 kg (280 lb 9.6 oz)] 127.3 kg (280 lb 9.6 oz) (08/20 9924) Constitutional:  Alert and oriented, No acute distress Cardiovascular: normal peripheral perfusion Respiratory: Normal respiratory effort  GI: Abdomen is soft, tender in LUQ, nondistended. Well healed surgical incisions GU: No CVA tenderness Neurologic: Grossly intact, no focal deficits Psychiatric: Normal mood and affect  Laboratory Data:   Recent Labs  01/15/16 0607 01/16/16 0527  WBC 8.0 7.5  HGB 8.1* 8.1*  HCT 24.5* 23.9*  PLT  170 230     Recent Labs  01/14/16 0553 01/15/16 0607 01/16/16 0527  NA 138 138 137  K 4.2 4.5 5.3*  CL 105 105 105  GLUCOSE 95 93 85  BUN 57* 61* 67*  CALCIUM 8.2* 8.3* 8.4*  CREATININE 7.89* 7.66* 7.83*     Results for orders placed or performed during the hospital encounter of 01/09/16 (from the past 24 hour(s))  CBC     Status: Abnormal   Collection Time: 01/16/16  5:27 AM  Result Value Ref Range   WBC 7.5 4.0 - 10.5 K/uL   RBC 2.54 (L) 4.22 - 5.81 MIL/uL   Hemoglobin 8.1 (L) 13.0 - 17.0 g/dL   HCT 23.9 (L) 39.0 - 52.0 %    MCV 94.1 78.0 - 100.0 fL   MCH 31.9 26.0 - 34.0 pg   MCHC 33.9 30.0 - 36.0 g/dL   RDW 16.8 (H) 11.5 - 15.5 %   Platelets 230 150 - 400 K/uL  Renal function panel     Status: Abnormal   Collection Time: 01/16/16  5:27 AM  Result Value Ref Range   Sodium 137 135 - 145 mmol/L   Potassium 5.3 (H) 3.5 - 5.1 mmol/L   Chloride 105 101 - 111 mmol/L   CO2 23 22 - 32 mmol/L   Glucose, Bld 85 65 - 99 mg/dL   BUN 67 (H) 6 - 20 mg/dL   Creatinine, Ser 7.83 (H) 0.61 - 1.24 mg/dL   Calcium 8.4 (L) 8.9 - 10.3 mg/dL   Phosphorus 5.3 (H) 2.5 - 4.6 mg/dL   Albumin 2.5 (L) 3.5 - 5.0 g/dL   GFR calc non Af Amer 7 (L) >60 mL/min   GFR calc Af Amer 8 (L) >60 mL/min   Anion gap 9 5 - 15  Procalcitonin     Status: None   Collection Time: 01/16/16  5:27 AM  Result Value Ref Range   Procalcitonin 21.06 ng/mL  Glucose, capillary     Status: None   Collection Time: 01/16/16  7:32 AM  Result Value Ref Range   Glucose-Capillary 88 65 - 99 mg/dL   Comment 1 Notify RN    Comment 2 Document in Chart    Recent Results (from the past 240 hour(s))  Urine culture     Status: Abnormal   Collection Time: 01/09/16  3:59 PM  Result Value Ref Range Status   Specimen Description URINE, CLEAN CATCH  Final   Special Requests NONE  Final   Culture >=100,000 COLONIES/mL KLEBSIELLA PNEUMONIAE (A)  Final   Report Status 01/12/2016 FINAL  Final   Organism ID, Bacteria KLEBSIELLA PNEUMONIAE (A)  Final      Susceptibility   Klebsiella pneumoniae - MIC*    AMPICILLIN >=32 RESISTANT Resistant     CEFAZOLIN <=4 SENSITIVE Sensitive     CEFTRIAXONE <=1 SENSITIVE Sensitive     CIPROFLOXACIN <=0.25 SENSITIVE Sensitive     GENTAMICIN <=1 SENSITIVE Sensitive     IMIPENEM <=0.25 SENSITIVE Sensitive     NITROFURANTOIN 128 RESISTANT Resistant     TRIMETH/SULFA <=20 SENSITIVE Sensitive     AMPICILLIN/SULBACTAM >=32 RESISTANT Resistant     PIP/TAZO 8 SENSITIVE Sensitive     Extended ESBL NEGATIVE Sensitive     * >=100,000  COLONIES/mL KLEBSIELLA PNEUMONIAE  Culture, blood (routine x 2)     Status: Abnormal   Collection Time: 01/09/16  7:05 PM  Result Value Ref Range Status   Specimen Description BLOOD RIGHT ANTECUBITAL  Final  Special Requests BOTTLES DRAWN AEROBIC AND ANAEROBIC 5CC  Final   Culture  Setup Time   Final    GRAM NEGATIVE RODS IN BOTH AEROBIC AND ANAEROBIC BOTTLES CRITICAL RESULT CALLED TO, READ BACK BY AND VERIFIED WITH: C SHADE,PHARMD AT 1010 01/10/16 BY Ronnie Derby Performed at [facility] (A)  Final   Report Status 01/12/2016 FINAL  Final   Organism ID, Bacteria KLEBSIELLA PNEUMONIAE  Final      Susceptibility   Klebsiella pneumoniae - MIC*    AMPICILLIN >=32 RESISTANT Resistant     CEFAZOLIN <=4 SENSITIVE Sensitive     CEFEPIME <=1 SENSITIVE Sensitive     CEFTAZIDIME <=1 SENSITIVE Sensitive     CEFTRIAXONE <=1 SENSITIVE Sensitive     CIPROFLOXACIN <=0.25 SENSITIVE Sensitive     GENTAMICIN <=1 SENSITIVE Sensitive     IMIPENEM 0.5 SENSITIVE Sensitive     TRIMETH/SULFA <=20 SENSITIVE Sensitive     AMPICILLIN/SULBACTAM >=32 RESISTANT Resistant     PIP/TAZO 8 SENSITIVE Sensitive     Extended ESBL NEGATIVE Sensitive     * KLEBSIELLA PNEUMONIAE  Blood Culture ID Panel (Reflexed)     Status: Abnormal   Collection Time: 01/09/16  7:05 PM  Result Value Ref Range Status   Enterococcus species NOT DETECTED NOT DETECTED Final   Vancomycin resistance NOT DETECTED NOT DETECTED Final   Listeria monocytogenes NOT DETECTED NOT DETECTED Final   Staphylococcus species NOT DETECTED NOT DETECTED Final   Staphylococcus aureus NOT DETECTED NOT DETECTED Final   Methicillin resistance NOT DETECTED NOT DETECTED Final   Streptococcus species NOT DETECTED NOT DETECTED Final   Streptococcus agalactiae NOT DETECTED NOT DETECTED Final   Streptococcus pneumoniae NOT DETECTED NOT DETECTED Final   Streptococcus pyogenes NOT DETECTED NOT DETECTED Final   Acinetobacter  baumannii NOT DETECTED NOT DETECTED Final   Enterobacteriaceae species DETECTED (A) NOT DETECTED Final    Comment: CRITICAL RESULT CALLED TO, READ BACK BY AND VERIFIED WITH: C. Shade Pharm.D. 10:10 01/10/16 ([email or web address])    Enterobacter cloacae complex NOT DETECTED NOT DETECTED Final   Escherichia coli NOT DETECTED NOT DETECTED Final   Klebsiella oxytoca NOT DETECTED NOT DETECTED Final   Klebsiella pneumoniae DETECTED (A) NOT DETECTED Final    Comment: CRITICAL RESULT CALLED TO, READ BACK BY AND VERIFIED WITH: C. Shade Pharm.D. 10:10 01/10/16  ([email or web address])    Proteus species NOT DETECTED NOT DETECTED Final   Serratia marcescens NOT DETECTED NOT DETECTED Final   Carbapenem resistance NOT DETECTED NOT DETECTED Final   Haemophilus influenzae NOT DETECTED NOT DETECTED Final   Neisseria meningitidis NOT DETECTED NOT DETECTED Final   Pseudomonas aeruginosa NOT DETECTED NOT DETECTED Final   Candida albicans NOT DETECTED NOT DETECTED Final   Candida glabrata NOT DETECTED NOT DETECTED Final   Candida krusei NOT DETECTED NOT DETECTED Final   Candida parapsilosis NOT DETECTED NOT DETECTED Final   Candida tropicalis NOT DETECTED NOT DETECTED Final  Culture, blood (routine x 2)     Status: Abnormal   Collection Time: 01/09/16  7:06 PM  Result Value Ref Range Status   Specimen Description BLOOD RIGHT HAND  Final   Special Requests IN PEDIATRIC BOTTLE 2CC  Final   Culture  Setup Time   Final    GRAM NEGATIVE RODS IN PEDIATRIC BOTTLE CRITICAL RESULT CALLED TO, READ BACK BY AND VERIFIED WITH: T SHADE,PHARMD AT 1010 01/10/16 BY M WILSON    Culture (A)  Final    KLEBSIELLA  PNEUMONIAE SUSCEPTIBILITIES PERFORMED ON PREVIOUS CULTURE WITHIN THE LAST 5 DAYS. Performed at [facility]    Report Status 01/12/2016 FINAL  Final  MRSA PCR Screening     Status: None   Collection Time: 01/10/16 12:33 AM  Result Value Ref Range Status   MRSA by PCR NEGATIVE NEGATIVE Final    Comment:        The GeneXpert  MRSA Assay (FDA approved for NASAL specimens only), is one component of a comprehensive MRSA colonization surveillance program. It is not intended to diagnose MRSA infection nor to guide or monitor treatment for MRSA infections.     Renal Function:  Recent Labs  01/10/16 0955 01/11/16 0330 01/12/16 0542 01/13/16 0550 01/14/16 0553 01/15/16 0607 01/16/16 0527  CREATININE 6.79* 7.36* 7.32* 7.59* 7.89* 7.66* 7.83*   Estimated Creatinine Clearance: 14.9 mL/min (by C-G formula based on SCr of 7.83 mg/dL).  Radiologic Imaging: Ct Abdomen Pelvis Wo Contrast  Result Date: 01/16/2016 CLINICAL DATA:  Fever. History of multiple myeloma. Diabetic. Right upper quadrant pain. EXAM: CT ABDOMEN AND PELVIS WITHOUT CONTRAST TECHNIQUE: Multidetector CT imaging of the abdomen and pelvis was performed following the standard protocol without IV contrast. COMPARISON:  Plain films of 01/14/2016.  CT of 01/09/2016. FINDINGS: Lower chest: Heart size upper normal with trace pericardial fluid or thickening. Trace bilateral pleural effusion is new. Hepatobiliary: Motion degradation throughout the upper abdomen. Grossly normal noncontrast appearance of the liver. Cholecystectomy, without biliary ductal dilatation. Pancreas: Normal, without mass or ductal dilatation. Spleen: Normal in size, without focal abnormality. Adrenals/Urinary Tract: Normal adrenal glands. Left renal cortical thinning. The left-sided perinephric interstitial thickening is similar. There is increase in moderate right perinephric interstitial thickening. Interpolar right renal 8 mm lesion is too small to characterize. Moderate right-sided caliectasis with mild right hydroureter throughout. This is followed to the level of the bladder, without obstructive cause identified. No bladder calculi. There is mass-effect upon the bladder by the osseous masses within the pelvis. Similar. Stomach/Bowel: Normal stomach, without wall thickening. Scattered  colonic diverticula. Normal terminal ileum and appendix. Normal small bowel. Vascular/Lymphatic: Normal caliber of the aorta and branch vessels. No abdominopelvic adenopathy. Reproductive: Normal prostate. Other: Expansile, lytic lesions within the bony pelvis are similar to on the recent exam, consistent with the clinical history of myeloma. Example right pubic bone lesion measures 6.1 x 5.0 cm. Musculoskeletal: Degenerative disc disease at the lumbosacral junction. IMPRESSION: 1. Multifactorial degradation, including motion, lack of IV contrast. 2. Persistent right-sided hydroureteronephrosis with no obstructive stone or mass identified. Increase in right perinephric interstitial thickening which could be secondary to obstruction or concurrent infection. Clinically exclude pyelonephritis. 3. Left renal atrophy with perinephric interstitial thickening, similar. The interstitial thickening is nonspecific but likely related to renal insufficiency. 4. Trace bilateral pleural effusions, new. 5. Multiple myeloma, as before. Bony lesions within the pelvis cause mass effect upon the urinary bladder, similar. This is not felt to directly cause the right-sided hydro nephrosis. 6. No residual small bowel obstruction. Electronically Signed   By: Abigail Miyamoto M.D.   On: 01/16/2016 14:01   Dg Abd Acute W/chest  Result Date: 01/14/2016 CLINICAL DATA:  Abdominal distention onset yesterday. History of multiple myeloma. EXAM: DG ABDOMEN ACUTE W/ 1V CHEST COMPARISON:  01/09/2016 FINDINGS: Mild cardiomegaly.  Lungs are clear.  No effusions or edema. Nonobstructive bowel gas pattern. No free air organomegaly. Prior cholecystectomy. Chronic deformity of the left hip and left pubic rami with large expansile lytic lesions. IMPRESSION: Mild cardiomegaly.  No  acute cardiopulmonary disease. Prior cholecystectomy.  No obstruction or free air. Continued expansile lytic lesions in the pelvis and deformity of the left hip compatible  patient's history of multiple myeloma. Electronically Signed   By: Rolm Baptise M.D.   On: 01/14/2016 16:13    I independently reviewed the above imaging studies.  Impression/Recommendation 55 yo male with CKD stage 4 and sepsis 2/2 UTI with right hydronephrosis due to unclear etiology and worsening creatinine. Given his sepsis from a urinary source and partial obstruction of his right kidney with rising creatinine and already borderline creatinine decompression of the right kidney would be reasonable. Discussed the recommendation for right ureteral stent placement, including the risks and benefits. He agrees with this and we'll proceed accordingly.  - Plan for cystoscopy, right retrograde pyelogram, right ureteral stent placement this evening - Please keep NPO pending OR. Can resume diet post-op - Agree with IV Cipro based on cultures. Could consider also re-broadening to Rocephin if persistent febrile despite stent  Lolita Rieger 01/16/2016, 3:42 PM   Patient was seen, examined,treatment plan was discussed with the resident.  I have directly reviewed the clinical findings, lab, imaging studies and management of this patient in detail. I have made the necessary changes and/or additions to the above noted documentation, and agree with the documentation, as recorded by the resident.

## 2016-01-17 ENCOUNTER — Encounter (HOSPITAL_COMMUNITY): Payer: Self-pay | Admitting: Urology

## 2016-01-17 LAB — RENAL FUNCTION PANEL
ALBUMIN: 2.2 g/dL — AB (ref 3.5–5.0)
Anion gap: 8 (ref 5–15)
BUN: 63 mg/dL — AB (ref 6–20)
CHLORIDE: 107 mmol/L (ref 101–111)
CO2: 22 mmol/L (ref 22–32)
Calcium: 8.3 mg/dL — ABNORMAL LOW (ref 8.9–10.3)
Creatinine, Ser: 7.54 mg/dL — ABNORMAL HIGH (ref 0.61–1.24)
GFR calc Af Amer: 8 mL/min — ABNORMAL LOW (ref >60)
GFR calc non Af Amer: 7 mL/min — ABNORMAL LOW (ref >60)
GLUCOSE: 87 mg/dL (ref 65–99)
POTASSIUM: 5.1 mmol/L (ref 3.5–5.1)
Phosphorus: 5.2 mg/dL — ABNORMAL HIGH (ref 2.5–4.6)
Sodium: 137 mmol/L (ref 135–145)

## 2016-01-17 LAB — CBC
HEMATOCRIT: 22.4 % — AB (ref 39.0–52.0)
Hemoglobin: 7.6 g/dL — ABNORMAL LOW (ref 13.0–17.0)
MCH: 31.3 pg (ref 26.0–34.0)
MCHC: 33.9 g/dL (ref 30.0–36.0)
MCV: 92.2 fL (ref 78.0–100.0)
PLATELETS: 269 10*3/uL (ref 150–400)
RBC: 2.43 MIL/uL — ABNORMAL LOW (ref 4.22–5.81)
RDW: 17 % — AB (ref 11.5–15.5)
WBC: 6 10*3/uL (ref 4.0–10.5)

## 2016-01-17 LAB — GLUCOSE, CAPILLARY: Glucose-Capillary: 88 mg/dL (ref 65–99)

## 2016-01-17 MED ORDER — MENTHOL 3 MG MT LOZG
1.0000 | LOZENGE | OROMUCOSAL | Status: DC | PRN
  Filled 2016-01-17: qty 9

## 2016-01-17 NOTE — Progress Notes (Addendum)
Patient ID: [name], male   DOB: 1961-02-04, 55 y.o.   MRN: [id number]    PROGRESS NOTE    FAY SWIDER  CWC:[id number] DOB: Sep 26, 1960 DOA: 01/09/2016  PCP: Pcp Not In System   Brief Narrative  55 year old male with history of multiple myeloma(On Revlimid, follows with oncologist in [facility]), hypertension, GERD, came to the ED with nausea, vomiting, abdominal pain and increased urinary frequency.  Patient found to be septic with fever of 102.51F, elevated lactic acid and neutropenia. 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  UA positive for UTI. CT of the abdomen and pelvis showed right hydronephrosis with perinephric and proximal periureteral stranding. Mild to moderate left perinephric stranding without hydronephrosis. Also commented on early versus partial small bowel obstruction.   Subjective:  Feeling better Little nausea S/p stent   Assessment &Plan :   Sepsis ([place]) Secondary to acute pyelonephritis, Klebsiella UTI, Klebsiella bacteremia - Blood cultures and urine cultures reviewed, positive for Klebsiella -repeat blood cultures pending -IV Rocephin change to cipro - Continue to monitor fever curve s/p stent for right hydronephrosis  Fever -see above -s/p stent- to follow up outpatient  Multiple myeloma [facility]) - follows with oncologist in [facility]. Holding revlimid   Acute on CKD (chronic kidney disease), stage V ([place]) - Progressive with creatinine  Spoke with his nephrologist nephrologist in [facility] ( Dr Cay Schillings, [phone])  - Patient was on dialysis until 2013 and was being monitored thereafter. He has a functioning AV fistula with baseline creatinine all 4-5. She recommends that patient would need dialysis. Also that patient lives in [place] and if he requires regular dialysis it might be difficult for him to take a bus to go to [place] for dialysis 3 times a  week. - Nephrology team assistance appreciated- may need transfer to [facility] For dialysis if not improved- discussed with Dr. Loletha Grayer  Hypertension - Continue home medical regimen  Diabetes mellitus without complication ([place]) - Continue home medical regimen  Partial small bowel obstruction ([place]) - resolved, eating well    Status post recent cholecystectomy   DVT prophylaxis: SCD's Code Status: Full  Family Communication: Patient at bedside  Disposition Plan:   Consultants:   Nephrology   Procedures:   None   Antimicrobials:   Zosyn 01/09/2016 - 01/10/2016  Vancomycin 01/09/2016 1 dose Rocephin 01/10/2016 - 8/20 cipro   Objective: Vitals:   01/16/16 2230 01/16/16 2244 01/17/16 0314 01/17/16 0501  BP: (!) 165/76  (!) 142/77 (!) 144/72  Pulse: 87 88 78 75  Resp: (!) 21 (!) 27  18  Temp:  100.3 F (37.9 C) 98.7 F (37.1 C) 98.4 F (36.9 C)  TempSrc:   Oral Oral  SpO2: 99% 98%  99%  Weight:      Height:        Intake/Output Summary (Last 24 hours) at 01/17/16 1136 Last data filed at 01/17/16 1031  Gross per 24 hour  Intake           831.67 ml  Output             1850 ml  Net         -1018.33 ml   Filed Weights   01/14/16 0524 01/15/16 0555 01/16/16 0614  Weight: 126 kg (277 lb 12.5 oz) 127 kg (280 lb) 127.3 kg (280 lb 9.6 oz)    Examination:  General exam: Appears calm and comfortable  Respiratory system:  Clear to auscultation. Respiratory effort normal. Cardiovascular system: S1 & S2 heard, RRR. No JVD, murmurs, rubs, gallops or clicks.  Gastrointestinal system: Abdomen is nondistended, soft and nontender. No organomegaly or masses felt. Central nervous system: Alert and oriented. No focal neurological deficits.    Data Reviewed: I have personally reviewed following labs and imaging studies  CBC:  Recent Labs Lab 01/11/16 0330 01/15/16 0607 01/16/16 0527 01/17/16 0556  WBC 10.7* 8.0 7.5 6.0  NEUTROABS 10.3*  --   --   --   HGB 9.2* 8.1*  8.1* 7.6*  HCT 27.6* 24.5* 23.9* 22.4*  MCV 98.6 93.9 94.1 92.2  PLT 159 170 230 191   Basic Metabolic Panel:  Recent Labs Lab 01/14/16 0553 01/15/16 0607 01/16/16 0527 01/16/16 1546 01/17/16 0556  NA 138 138 137 137 137  K 4.2 4.5 5.3* 4.3 5.1  CL 105 105 105 104 107  CO2 _0 GLUCOSE 95 93 85 105* 87  BUN 57* 61* 67* 65* 63*  CREATININE 7.89* 7.66* 7.83* 7.54* 7.54*  CALCIUM 8.2* 8.3* 8.4* 8.5* 8.3*  PHOS  --   --  5.3* 4.5 5.2*   Liver Function Tests:  Recent Labs Lab 01/16/16 0527 01/16/16 1546 01/17/16 0556  ALBUMIN 2.5* 2.4* 2.2*   No results for input(s): LIPASE, AMYLASE in the last 168 hours.Coagulation Profile: No results for input(s): INR, PROTIME in the last 168 hours. CBG:  Recent Labs Lab 01/14/16 0733 01/15/16 0733 01/16/16 0732 01/16/16 2235 01/17/16 0739  GLUCAP 128* 87 88 100* 88   Urine analysis:    Component Value Date/Time   COLORURINE YELLOW 01/14/2016 1256   APPEARANCEUR CLEAR 01/14/2016 1256   LABSPEC 1.014 01/14/2016 1256   PHURINE 6.0 01/14/2016 1256   GLUCOSEU NEGATIVE 01/14/2016 1256   HGBUR TRACE (A) 01/14/2016 1256   BILIRUBINUR NEGATIVE 01/14/2016 1256   KETONESUR NEGATIVE 01/14/2016 1256   PROTEINUR 100 (A) 01/14/2016 1256   UROBILINOGEN 0.2 06/24/2007 2040   NITRITE NEGATIVE 01/14/2016 1256   LEUKOCYTESUR NEGATIVE 01/14/2016 1256    Recent Results (from the past 240 hour(s))  Urine culture     Status: Abnormal   Collection Time: 01/09/16  3:59 PM  Result Value Ref Range Status   Specimen Description URINE, CLEAN CATCH  Final   Special Requests NONE  Final   Culture >=100,000 COLONIES/mL KLEBSIELLA PNEUMONIAE (A)  Final   Report Status 01/12/2016 FINAL  Final   Organism ID, Bacteria KLEBSIELLA PNEUMONIAE (A)  Final      Susceptibility   Klebsiella pneumoniae - MIC*    AMPICILLIN >=32 RESISTANT Resistant     CEFAZOLIN <=4 SENSITIVE Sensitive     CEFTRIAXONE <=1 SENSITIVE Sensitive     CIPROFLOXACIN  <=0.25 SENSITIVE Sensitive     GENTAMICIN <=1 SENSITIVE Sensitive     IMIPENEM <=0.25 SENSITIVE Sensitive     NITROFURANTOIN 128 RESISTANT Resistant     TRIMETH/SULFA <=20 SENSITIVE Sensitive     AMPICILLIN/SULBACTAM >=32 RESISTANT Resistant     PIP/TAZO 8 SENSITIVE Sensitive     Extended ESBL NEGATIVE Sensitive     * >=100,000 COLONIES/mL KLEBSIELLA PNEUMONIAE  Culture, blood (routine x 2)     Status: Abnormal   Collection Time: 01/09/16  7:05 PM  Result Value Ref Range Status   Specimen Description BLOOD RIGHT ANTECUBITAL  Final   Special Requests BOTTLES DRAWN AEROBIC AND ANAEROBIC 5CC  Final   Culture  Setup Time   Final    GRAM NEGATIVE RODS  IN BOTH AEROBIC AND ANAEROBIC BOTTLES CRITICAL RESULT CALLED TO, READ BACK BY AND VERIFIED WITH: C SHADE,PHARMD AT 1010 01/10/16 BY Ronnie Derby Performed at [facility] (A)  Final   Report Status 01/12/2016 FINAL  Final   Organism ID, Bacteria KLEBSIELLA PNEUMONIAE  Final      Susceptibility   Klebsiella pneumoniae - MIC*    AMPICILLIN >=32 RESISTANT Resistant     CEFAZOLIN <=4 SENSITIVE Sensitive     CEFEPIME <=1 SENSITIVE Sensitive     CEFTAZIDIME <=1 SENSITIVE Sensitive     CEFTRIAXONE <=1 SENSITIVE Sensitive     CIPROFLOXACIN <=0.25 SENSITIVE Sensitive     GENTAMICIN <=1 SENSITIVE Sensitive     IMIPENEM 0.5 SENSITIVE Sensitive     TRIMETH/SULFA <=20 SENSITIVE Sensitive     AMPICILLIN/SULBACTAM >=32 RESISTANT Resistant     PIP/TAZO 8 SENSITIVE Sensitive     Extended ESBL NEGATIVE Sensitive     * KLEBSIELLA PNEUMONIAE  Blood Culture ID Panel (Reflexed)     Status: Abnormal   Collection Time: 01/09/16  7:05 PM  Result Value Ref Range Status   Enterococcus species NOT DETECTED NOT DETECTED Final   Vancomycin resistance NOT DETECTED NOT DETECTED Final   Listeria monocytogenes NOT DETECTED NOT DETECTED Final   Staphylococcus species NOT DETECTED NOT DETECTED Final   Staphylococcus aureus NOT  DETECTED NOT DETECTED Final   Methicillin resistance NOT DETECTED NOT DETECTED Final   Streptococcus species NOT DETECTED NOT DETECTED Final   Streptococcus agalactiae NOT DETECTED NOT DETECTED Final   Streptococcus pneumoniae NOT DETECTED NOT DETECTED Final   Streptococcus pyogenes NOT DETECTED NOT DETECTED Final   Acinetobacter baumannii NOT DETECTED NOT DETECTED Final   Enterobacteriaceae species DETECTED (A) NOT DETECTED Final    Comment: CRITICAL RESULT CALLED TO, READ BACK BY AND VERIFIED WITH: C. Shade Pharm.D. 10:10 01/10/16 ([email or web address])    Enterobacter cloacae complex NOT DETECTED NOT DETECTED Final   Escherichia coli NOT DETECTED NOT DETECTED Final   Klebsiella oxytoca NOT DETECTED NOT DETECTED Final   Klebsiella pneumoniae DETECTED (A) NOT DETECTED Final    Comment: CRITICAL RESULT CALLED TO, READ BACK BY AND VERIFIED WITH: C. Shade Pharm.D. 10:10 01/10/16  ([email or web address])    Proteus species NOT DETECTED NOT DETECTED Final   Serratia marcescens NOT DETECTED NOT DETECTED Final   Carbapenem resistance NOT DETECTED NOT DETECTED Final   Haemophilus influenzae NOT DETECTED NOT DETECTED Final   Neisseria meningitidis NOT DETECTED NOT DETECTED Final   Pseudomonas aeruginosa NOT DETECTED NOT DETECTED Final   Candida albicans NOT DETECTED NOT DETECTED Final   Candida glabrata NOT DETECTED NOT DETECTED Final   Candida krusei NOT DETECTED NOT DETECTED Final   Candida parapsilosis NOT DETECTED NOT DETECTED Final   Candida tropicalis NOT DETECTED NOT DETECTED Final  Culture, blood (routine x 2)     Status: Abnormal   Collection Time: 01/09/16  7:06 PM  Result Value Ref Range Status   Specimen Description BLOOD RIGHT HAND  Final   Special Requests IN PEDIATRIC BOTTLE 2CC  Final   Culture  Setup Time   Final    GRAM NEGATIVE RODS IN PEDIATRIC BOTTLE CRITICAL RESULT CALLED TO, READ BACK BY AND VERIFIED WITH: T SHADE,PHARMD AT 1010 01/10/16 BY M WILSON    Culture (A)  Final    KLEBSIELLA  PNEUMONIAE SUSCEPTIBILITIES PERFORMED ON PREVIOUS CULTURE WITHIN THE LAST 5 DAYS. Performed at [facility]    Report Status 01/12/2016 FINAL  Final  MRSA PCR Screening     Status: None   Collection Time: 01/10/16 12:33 AM  Result Value Ref Range Status   MRSA by PCR NEGATIVE NEGATIVE Final    Comment:        The GeneXpert MRSA Assay (FDA approved for NASAL specimens only), is one component of a comprehensive MRSA colonization surveillance program. It is not intended to diagnose MRSA infection nor to guide or monitor treatment for MRSA infections.   Culture, blood (Routine X 2) w Reflex to ID Panel     Status: None (Preliminary result)   Collection Time: 01/15/16  3:25 PM  Result Value Ref Range Status   Specimen Description BLOOD RIGHT HAND  Final   Special Requests IN PEDIATRIC BOTTLE 1 CC  Final   Culture   Final    NO GROWTH < 24 HOURS Performed at [facility]    Report Status PENDING  Incomplete  Surgical pcr screen     Status: None   Collection Time: 01/16/16  6:24 PM  Result Value Ref Range Status   MRSA, PCR NEGATIVE NEGATIVE Final   Staphylococcus aureus NEGATIVE NEGATIVE Final    Comment:        The Xpert SA Assay (FDA approved for NASAL specimens in patients over 13 years of age), is one component of a comprehensive surveillance program.  Test performance has been validated by [facility] for patients greater than or equal to 10 year old. It is not intended to diagnose infection nor to guide or monitor treatment.       Radiology Studies: Ct Abdomen Pelvis Wo Contrast  Result Date: 01/16/2016 CLINICAL DATA:  Fever. History of multiple myeloma. Diabetic. Right upper quadrant pain. EXAM: CT ABDOMEN AND PELVIS WITHOUT CONTRAST TECHNIQUE: Multidetector CT imaging of the abdomen and pelvis was performed following the standard protocol without IV contrast. COMPARISON:  Plain films of 01/14/2016.  CT of 01/09/2016. FINDINGS: Lower chest: Heart  size upper normal with trace pericardial fluid or thickening. Trace bilateral pleural effusion is new. Hepatobiliary: Motion degradation throughout the upper abdomen. Grossly normal noncontrast appearance of the liver. Cholecystectomy, without biliary ductal dilatation. Pancreas: Normal, without mass or ductal dilatation. Spleen: Normal in size, without focal abnormality. Adrenals/Urinary Tract: Normal adrenal glands. Left renal cortical thinning. The left-sided perinephric interstitial thickening is similar. There is increase in moderate right perinephric interstitial thickening. Interpolar right renal 8 mm lesion is too small to characterize. Moderate right-sided caliectasis with mild right hydroureter throughout. This is followed to the level of the bladder, without obstructive cause identified. No bladder calculi. There is mass-effect upon the bladder by the osseous masses within the pelvis. Similar. Stomach/Bowel: Normal stomach, without wall thickening. Scattered colonic diverticula. Normal terminal ileum and appendix. Normal small bowel. Vascular/Lymphatic: Normal caliber of the aorta and branch vessels. No abdominopelvic adenopathy. Reproductive: Normal prostate. Other: Expansile, lytic lesions within the bony pelvis are similar to on the recent exam, consistent with the clinical history of myeloma. Example right pubic bone lesion measures 6.1 x 5.0 cm. Musculoskeletal: Degenerative disc disease at the lumbosacral junction. IMPRESSION: 1. Multifactorial degradation, including motion, lack of IV contrast. 2. Persistent right-sided hydroureteronephrosis with no obstructive stone or mass identified. Increase in right perinephric interstitial thickening which could be secondary to obstruction or concurrent infection. Clinically exclude pyelonephritis. 3. Left renal atrophy with perinephric interstitial thickening, similar. The interstitial thickening is nonspecific but likely related to renal insufficiency. 4.  Trace bilateral pleural effusions, new. 5. Multiple myeloma, as  before. Bony lesions within the pelvis cause mass effect upon the urinary bladder, similar. This is not felt to directly cause the right-sided hydro nephrosis. 6. No residual small bowel obstruction. Electronically Signed   By: Abigail Miyamoto M.D.   On: 01/16/2016 14:01      Scheduled Meds: . amLODipine  2.5 mg Oral Daily  . aspirin  325 mg Oral QHS  . calcitRIOL  0.5 mcg Oral Once per day on Mon Tue Wed Thu Fri  . calcitRIOL  0.5 mcg Oral 2 times per day on Sun Sat  . ciprofloxacin  400 mg Intravenous Q24H  . doxazosin  4 mg Oral QHS  . escitalopram  10 mg Oral QHS  . febuxostat  40 mg Oral Daily  . fluticasone  1 spray Each Nare Daily  . loratadine  10 mg Oral Daily  . multivitamin  1 tablet Oral Daily  . pantoprazole  40 mg Oral Daily  . pneumococcal 23 valent vaccine  0.5 mL Intramuscular Tomorrow-1000  . senna  1 tablet Oral QPM  . sodium bicarbonate  1,300 mg Oral QID  . sodium chloride flush  3 mL Intravenous Q12H   Continuous Infusions: . sodium chloride 50 mL/hr at 01/17/16 0902     LOS: 8 days    Time spent: 25 minutes   [place], DO [facility] Pager [phone]  If 7PM-7AM, please contact night-coverage [email or web address] Password [facility] 01/17/2016, 11:36 AM

## 2016-01-17 NOTE — Discharge Instructions (Signed)
Bacteremia °Bacteremia is the presence of bacteria in the blood. A small amount of bacteria may not cause any symptoms. °Sometimes, the bacteria spread and cause infection in other parts of the body, such as the heart, joints, bones, or brain. Having a great amount of bacteria can cause a serious, sometimes life-threatening infection called sepsis. °CAUSES °This condition is caused by bacteria that get into the blood. Bacteria can enter the blood: °· During a dental or medical procedure. °· After you brush your teeth so hard that the gums bleed. °· Through a scrape or cut on your skin. °More severe types of bacteremia can be caused by: °· A bacterial infection, such as pneumonia, that spreads to the blood. °· Using a dirty needle. °RISK FACTORS °This condition is more likely to develop in: °· Children and elderly adults. °· People who have a long-lasting (chronic) disease or medical condition. °· People who have an artificial joint or heart valve. °· People who have heart valve disease. °· People who have a tube, such as a catheter or IV tube, that has been inserted for a medical treatment. °· People who have a weak body defense system (immune system). °· People who use IV drugs. °SYMPTOMS °Usually, this condition does not cause symptoms when it is mild. When it is more serious, it may cause: °· Fever. °· Chills. °· Racing heart. °· Shortness of breath. °· Dizziness. °· Weakness. °· Confusion. °· Nausea or vomiting. °· Diarrhea. °Bacteremia that has spread to other parts of the body may cause symptoms in those areas. °DIAGNOSIS °This condition may be diagnosed with a physical exam and tests, such as: °· A complete blood count (CBC). This test looks for signs of infection. °· Blood cultures. These look for bacteria in your blood. °· Tests of any IV tubes. These look for a source of infection. °· Urine tests. °· Imaging tests, such as an X-ray, CT scan, MRI, or heart ultrasound. °TREATMENT °If the condition is mild,  treatment is usually not needed. Usually, the body's immune system will remove the bacteria. If the condition is more serious, it may be treated with: °· Antibiotic medicines through an IV tube. These may be given for about 2 weeks. At first, the antibiotic that is given may kill most types of blood bacteria. If your test results show that a certain kind of bacteria is causing problems, the antibiotic may be changed to kill only the bacteria that are causing problems. °· Antibiotics taken by mouth. °· Removing any catheter or IV tube that is a source of infection. °· Blood pressure and breathing support, if needed. °· Surgery to control the source or spread of infection, if needed. °HOME CARE INSTRUCTIONS °· Take over-the-counter and prescription medicines only as told by your health care provider. °· If you were prescribed an antibiotic, take it as told by your health care provider. Do not stop taking the antibiotic even if you start to feel better. °· Rest at home until your condition is under control. °· Drink enough fluid to keep your urine clear or pale yellow. °· Keep all follow-up visits as told by your health care provider. This is important. °PREVENTION °Take these actions to help prevent future episodes of bacteremia: °· Get all vaccinations as recommended by your health care provider. °· Clean and cover scrapes or cuts. °· Bathe regularly. °· Wash your hands often. °· Before any dental or surgical procedure, ask your health care provider if you should take an antibiotic. °SEEK MEDICAL   CARE IF:  Your symptoms get worse.  You continue to have symptoms after treatment.  You develop new symptoms after treatment. SEEK IMMEDIATE MEDICAL CARE IF:  You have chest pain or trouble breathing.  You develop confusion, dizziness, or weakness.  You develop pale skin.   This information is not intended to replace advice given to you by your health care provider. Make sure you discuss any questions you have  with your health care provider.   Document Released: 02/26/2006 Document Revised: 02/03/2015 Document Reviewed: 07/18/2014 Elsevier Interactive Patient Education 2016 [place] stent placement instructions   Definitions:  Ureter: The duct that transports urine from the kidney to the bladder. Stent: A plastic hollow tube that is placed into the ureter, from the kidney to the bladder to prevent the ureter from swelling shut.  General instructions:  Despite the fact that no skin incisions were used, the area around the ureter and bladder is raw and irritated. The stent is a foreign body which can further irritate the bladder wall. This irritation is manifested by increased frequency of urination, both day and night, and by an increase in the urge to urinate. In some, the urge to urinate is present almost always. Sometimes the urge is strong enough that you may not be able to stop your self from urinating. This can often be controlled with medication but does not occur in everyone. A stent can safely be left in place for 3 months or greater.  You may see some blood in your urine while the stent is in place and a few days afterward. Do not be alarmed, even if the urine is clear for a while. Get off your feet and drink lots of fluids until clearing occurs. If you start to pass clots or don't improve, call us.  Diet:  You may return to your normal diet immediately. Because of the raw surface of your bladder, alcohol, spicy foods, foods high in acid and drinks with caffeine may cause irritation or frequency and should be used in moderation. To keep your urine flowing freely and avoid constipation, drink plenty of fluids during the day (8-10 glasses). Tip: Avoid cranberry juice because it is very acidic.  Activity:  Your physical activity doesn't need to be restricted. However, if you are very active, you may see some blood in the urine. We suggest that you reduce your activity under  the circumstances until the bleeding has stopped.  Bowels:  It is important to keep your bowels regular during the postoperative period. Straining with bowel movements can cause bleeding. A bowel movement every other day is reasonable. Use a mild laxative if needed, such as milk of magnesia 2-3 tablespoons, or 2 Dulcolax tablets. Call if you continue to have problems. If you had been taking narcotics for pain, before, during or after your surgery, you may be constipated. Take a laxative if necessary.  Medication:  You should resume your pre-surgery medications unless told not to. In addition you may be given an antibiotic to prevent or treat infection. Antibiotics are not always necessary. All medication should be taken as prescribed until the bottles are finished unless you are having an unusual reaction to one of the drugs.  Problems you should report to [place]:  a. Fever greater than 101F. b. Heavy bleeding, or clots (see notes above about blood in urine). c. Inability to urinate. d. Drug reactions (hives, rash, nausea, vomiting, diarrhea). e. Severe burning or pain with urination that is  not improving.

## 2016-01-17 NOTE — Progress Notes (Signed)
Physical Therapy Treatment Patient Details Name: [name] MRN: [id number] DOB: 1961/01/01 Today's Date: 01/17/2016    History of Present Illness 55 y.o. male with medical history significant of multiple myeloma (on Revlimid), hypertension, GERD, gout, depression, CKD-V,  S/p of cholecystectomy last week, who presents with nausea, vomiting, abdominal pain and increased urinary frequency; diagnosed with Pyelonephritis and sepsis    PT Comments    Progressing with mobility.   Follow Up Recommendations  No PT follow up     Equipment Recommendations  None recommended by PT    Recommendations for Other Services       Precautions / Restrictions Precautions Precautions: Fall Restrictions Weight Bearing Restrictions: No    Mobility  Bed Mobility Overal bed mobility: Modified Independent                Transfers Overall transfer level: Needs assistance Equipment used: Quad cane Transfers: Sit to/from Stand Sit to Stand: Supervision         General transfer comment: for safety  Ambulation/Gait Ambulation/Gait assistance: Min guard Ambulation Distance (Feet): 350 Feet Assistive device: Quad cane Gait Pattern/deviations: Step-to pattern;Step-through pattern;Decreased stride length     General Gait Details: close guard for safety. A couple of standing rest breaks needed. Dyspnea 2/4. O2 sats 94% on RA during ambulation   Stairs            Wheelchair Mobility    Modified Rankin (Stroke Patients Only)       Balance                                    Cognition Arousal/Alertness: Awake/alert Behavior During Therapy: WFL for tasks assessed/performed Overall Cognitive Status: Within Functional Limits for tasks assessed                      Exercises      General Comments        Pertinent Vitals/Pain Pain Assessment: No/denies pain    Home Living                      Prior Function            PT  Goals (current goals can now be found in the care plan section) Progress towards PT goals: Progressing toward goals    Frequency  Min 3X/week    PT Plan Current plan remains appropriate    Co-evaluation             End of Session   Activity Tolerance: Patient tolerated treatment well Patient left: in bed;with call bell/phone within reach     Time: 1354-1415 PT Time Calculation (min) (ACUTE ONLY): 21 min  Charges:  $Gait Training: 8-22 mins                    G Codes:      Weston Anna, MPT Pager: [phone]

## 2016-01-17 NOTE — Progress Notes (Signed)
Patient ID: [name], male   DOB: 08/24/1960, 55 y.o.   MRN: [id number]  Assessment: Right ureteral obstruction - He had a double-J stent placed last night and is tolerating this fairly well. His creatinine remains pending at this time. We discussed the need to treat the cause of his extrinsic obstruction and then remove the stent once this has occurred. I told him a stent can remain indwelling for up to 3 months. We discussed the need for follow-up as an outpatient in my office. He will remain on broad-spectrum antibiotics.  Plan: 1. Continue broad-spectrum antibiotics. 2. Follow-up as an outpatient.    Subjective: Patient reports he did have some purulent appearing urine when he urinated the first time after having his stent placed. He said that his cleared. He is not having any right flank pain. He does complain of some lower back pain across the midline. He is voiding without difficulty.  Objective: Vital signs in last 24 hours: Temp:  [98.4 F (36.9 C)-100.6 F (38.1 C)] 98.4 F (36.9 C) (08/21 0501) Pulse Rate:  [75-95] 75 (08/21 0501) Resp:  [17-27] 18 (08/21 0501) BP: (142-172)/(68-77) 144/72 (08/21 0501) SpO2:  [98 %-100 %] 99 % (08/21 0501) Weight:  [127.3 kg (280 lb 9.6 oz)] 127.3 kg (280 lb 9.6 oz) (08/20 0614)A  Intake/Output from previous day: 08/20 0701 - 08/21 0700 In: 935 [I.V.:735; IV Piggyback:200] Out: 1300 [Urine:1300] Intake/Output this shift: Total I/O In: 250 [I.V.:250] Out: 750 [Urine:750]  Past Medical History:  Diagnosis Date  . Diabetes mellitus without complication ([place])   . Hypertension   . Multiple myeloma ([place])   . Renal disorder     Physical Exam:  Lungs - Normal respiratory effort, chest expands symmetrically.  Abdomen - Soft, non-tender & non-distended.  Lab Results:  Recent Labs  01/15/16 0607 01/16/16 0527  WBC 8.0 7.5  HGB 8.1* 8.1*  HCT 24.5* 23.9*   BMET  Recent Labs  01/16/16 0527 01/16/16 1546  NA 137 137   K 5.3* 4.3  CL 105 104  CO2 23 25  GLUCOSE 85 105*  BUN 67* 65*  CREATININE 7.83* 7.54*  CALCIUM 8.4* 8.5*   No results for input(s): LABURIN in the last 72 hours. Results for orders placed or performed during the hospital encounter of 01/09/16  Urine culture     Status: Abnormal   Collection Time: 01/09/16  3:59 PM  Result Value Ref Range Status   Specimen Description URINE, CLEAN CATCH  Final   Special Requests NONE  Final   Culture >=100,000 COLONIES/mL KLEBSIELLA PNEUMONIAE (A)  Final   Report Status 01/12/2016 FINAL  Final   Organism ID, Bacteria KLEBSIELLA PNEUMONIAE (A)  Final      Susceptibility   Klebsiella pneumoniae - MIC*    AMPICILLIN >=32 RESISTANT Resistant     CEFAZOLIN <=4 SENSITIVE Sensitive     CEFTRIAXONE <=1 SENSITIVE Sensitive     CIPROFLOXACIN <=0.25 SENSITIVE Sensitive     GENTAMICIN <=1 SENSITIVE Sensitive     IMIPENEM <=0.25 SENSITIVE Sensitive     NITROFURANTOIN 128 RESISTANT Resistant     TRIMETH/SULFA <=20 SENSITIVE Sensitive     AMPICILLIN/SULBACTAM >=32 RESISTANT Resistant     PIP/TAZO 8 SENSITIVE Sensitive     Extended ESBL NEGATIVE Sensitive     * >=100,000 COLONIES/mL KLEBSIELLA PNEUMONIAE  Culture, blood (routine x 2)     Status: Abnormal   Collection Time: 01/09/16  7:05 PM  Result Value Ref Range Status   Specimen  Description BLOOD RIGHT ANTECUBITAL  Final   Special Requests BOTTLES DRAWN AEROBIC AND ANAEROBIC 5CC  Final   Culture  Setup Time   Final    GRAM NEGATIVE RODS IN BOTH AEROBIC AND ANAEROBIC BOTTLES CRITICAL RESULT CALLED TO, READ BACK BY AND VERIFIED WITH: C SHADE,PHARMD AT 1010 01/10/16 BY Ronnie Derby Performed at [facility] (A)  Final   Report Status 01/12/2016 FINAL  Final   Organism ID, Bacteria KLEBSIELLA PNEUMONIAE  Final      Susceptibility   Klebsiella pneumoniae - MIC*    AMPICILLIN >=32 RESISTANT Resistant     CEFAZOLIN <=4 SENSITIVE Sensitive     CEFEPIME <=1 SENSITIVE  Sensitive     CEFTAZIDIME <=1 SENSITIVE Sensitive     CEFTRIAXONE <=1 SENSITIVE Sensitive     CIPROFLOXACIN <=0.25 SENSITIVE Sensitive     GENTAMICIN <=1 SENSITIVE Sensitive     IMIPENEM 0.5 SENSITIVE Sensitive     TRIMETH/SULFA <=20 SENSITIVE Sensitive     AMPICILLIN/SULBACTAM >=32 RESISTANT Resistant     PIP/TAZO 8 SENSITIVE Sensitive     Extended ESBL NEGATIVE Sensitive     * KLEBSIELLA PNEUMONIAE  Blood Culture ID Panel (Reflexed)     Status: Abnormal   Collection Time: 01/09/16  7:05 PM  Result Value Ref Range Status   Enterococcus species NOT DETECTED NOT DETECTED Final   Vancomycin resistance NOT DETECTED NOT DETECTED Final   Listeria monocytogenes NOT DETECTED NOT DETECTED Final   Staphylococcus species NOT DETECTED NOT DETECTED Final   Staphylococcus aureus NOT DETECTED NOT DETECTED Final   Methicillin resistance NOT DETECTED NOT DETECTED Final   Streptococcus species NOT DETECTED NOT DETECTED Final   Streptococcus agalactiae NOT DETECTED NOT DETECTED Final   Streptococcus pneumoniae NOT DETECTED NOT DETECTED Final   Streptococcus pyogenes NOT DETECTED NOT DETECTED Final   Acinetobacter baumannii NOT DETECTED NOT DETECTED Final   Enterobacteriaceae species DETECTED (A) NOT DETECTED Final    Comment: CRITICAL RESULT CALLED TO, READ BACK BY AND VERIFIED WITH: C. Shade Pharm.D. 10:10 01/10/16 ([email or web address])    Enterobacter cloacae complex NOT DETECTED NOT DETECTED Final   Escherichia coli NOT DETECTED NOT DETECTED Final   Klebsiella oxytoca NOT DETECTED NOT DETECTED Final   Klebsiella pneumoniae DETECTED (A) NOT DETECTED Final    Comment: CRITICAL RESULT CALLED TO, READ BACK BY AND VERIFIED WITH: C. Shade Pharm.D. 10:10 01/10/16  ([email or web address])    Proteus species NOT DETECTED NOT DETECTED Final   Serratia marcescens NOT DETECTED NOT DETECTED Final   Carbapenem resistance NOT DETECTED NOT DETECTED Final   Haemophilus influenzae NOT DETECTED NOT DETECTED Final   Neisseria meningitidis  NOT DETECTED NOT DETECTED Final   Pseudomonas aeruginosa NOT DETECTED NOT DETECTED Final   Candida albicans NOT DETECTED NOT DETECTED Final   Candida glabrata NOT DETECTED NOT DETECTED Final   Candida krusei NOT DETECTED NOT DETECTED Final   Candida parapsilosis NOT DETECTED NOT DETECTED Final   Candida tropicalis NOT DETECTED NOT DETECTED Final  Culture, blood (routine x 2)     Status: Abnormal   Collection Time: 01/09/16  7:06 PM  Result Value Ref Range Status   Specimen Description BLOOD RIGHT HAND  Final   Special Requests IN PEDIATRIC BOTTLE 2CC  Final   Culture  Setup Time   Final    GRAM NEGATIVE RODS IN PEDIATRIC BOTTLE CRITICAL RESULT CALLED TO, READ BACK BY AND VERIFIED WITH: T SHADE,PHARMD AT 1010 01/10/16 BY Ronnie Derby  Culture (A)  Final    KLEBSIELLA PNEUMONIAE SUSCEPTIBILITIES PERFORMED ON PREVIOUS CULTURE WITHIN THE LAST 5 DAYS. Performed at [facility]    Report Status 01/12/2016 FINAL  Final  MRSA PCR Screening     Status: None   Collection Time: 01/10/16 12:33 AM  Result Value Ref Range Status   MRSA by PCR NEGATIVE NEGATIVE Final    Comment:        The GeneXpert MRSA Assay (FDA approved for NASAL specimens only), is one component of a comprehensive MRSA colonization surveillance program. It is not intended to diagnose MRSA infection nor to guide or monitor treatment for MRSA infections.   Culture, blood (Routine X 2) w Reflex to ID Panel     Status: None (Preliminary result)   Collection Time: 01/15/16  3:25 PM  Result Value Ref Range Status   Specimen Description BLOOD RIGHT HAND  Final   Special Requests IN PEDIATRIC BOTTLE 1 CC  Final   Culture   Final    NO GROWTH < 24 HOURS Performed at [facility]    Report Status PENDING  Incomplete  Surgical pcr screen     Status: None   Collection Time: 01/16/16  6:24 PM  Result Value Ref Range Status   MRSA, PCR NEGATIVE NEGATIVE Final   Staphylococcus aureus NEGATIVE NEGATIVE Final     Comment:        The Xpert SA Assay (FDA approved for NASAL specimens in patients over 43 years of age), is one component of a comprehensive surveillance program.  Test performance has been validated by [facility] for patients greater than or equal to 24 year old. It is not intended to diagnose infection nor to guide or monitor treatment.     Studies/Results: Ct Abdomen Pelvis Wo Contrast  Result Date: 01/16/2016 CLINICAL DATA:  Fever. History of multiple myeloma. Diabetic. Right upper quadrant pain. EXAM: CT ABDOMEN AND PELVIS WITHOUT CONTRAST TECHNIQUE: Multidetector CT imaging of the abdomen and pelvis was performed following the standard protocol without IV contrast. COMPARISON:  Plain films of 01/14/2016.  CT of 01/09/2016. FINDINGS: Lower chest: Heart size upper normal with trace pericardial fluid or thickening. Trace bilateral pleural effusion is new. Hepatobiliary: Motion degradation throughout the upper abdomen. Grossly normal noncontrast appearance of the liver. Cholecystectomy, without biliary ductal dilatation. Pancreas: Normal, without mass or ductal dilatation. Spleen: Normal in size, without focal abnormality. Adrenals/Urinary Tract: Normal adrenal glands. Left renal cortical thinning. The left-sided perinephric interstitial thickening is similar. There is increase in moderate right perinephric interstitial thickening. Interpolar right renal 8 mm lesion is too small to characterize. Moderate right-sided caliectasis with mild right hydroureter throughout. This is followed to the level of the bladder, without obstructive cause identified. No bladder calculi. There is mass-effect upon the bladder by the osseous masses within the pelvis. Similar. Stomach/Bowel: Normal stomach, without wall thickening. Scattered colonic diverticula. Normal terminal ileum and appendix. Normal small bowel. Vascular/Lymphatic: Normal caliber of the aorta and branch vessels. No abdominopelvic adenopathy.  Reproductive: Normal prostate. Other: Expansile, lytic lesions within the bony pelvis are similar to on the recent exam, consistent with the clinical history of myeloma. Example right pubic bone lesion measures 6.1 x 5.0 cm. Musculoskeletal: Degenerative disc disease at the lumbosacral junction. IMPRESSION: 1. Multifactorial degradation, including motion, lack of IV contrast. 2. Persistent right-sided hydroureteronephrosis with no obstructive stone or mass identified. Increase in right perinephric interstitial thickening which could be secondary to obstruction or concurrent infection. Clinically exclude pyelonephritis. 3.  Left renal atrophy with perinephric interstitial thickening, similar. The interstitial thickening is nonspecific but likely related to renal insufficiency. 4. Trace bilateral pleural effusions, new. 5. Multiple myeloma, as before. Bony lesions within the pelvis cause mass effect upon the urinary bladder, similar. This is not felt to directly cause the right-sided hydro nephrosis. 6. No residual small bowel obstruction. Electronically Signed   By: Abigail Miyamoto M.D.   On: 01/16/2016 14:01      Torsha Lemus C 01/17/2016, 5:48 AM

## 2016-01-18 LAB — BASIC METABOLIC PANEL
Anion gap: 7 (ref 5–15)
BUN: 62 mg/dL — AB (ref 6–20)
CHLORIDE: 108 mmol/L (ref 101–111)
CO2: 24 mmol/L (ref 22–32)
Calcium: 8.5 mg/dL — ABNORMAL LOW (ref 8.9–10.3)
Creatinine, Ser: 7.1 mg/dL — ABNORMAL HIGH (ref 0.61–1.24)
GFR calc Af Amer: 9 mL/min — ABNORMAL LOW (ref >60)
GFR calc non Af Amer: 8 mL/min — ABNORMAL LOW (ref >60)
GLUCOSE: 87 mg/dL (ref 65–99)
POTASSIUM: 4.7 mmol/L (ref 3.5–5.1)
Sodium: 139 mmol/L (ref 135–145)

## 2016-01-18 LAB — CBC
HEMATOCRIT: 25.2 % — AB (ref 39.0–52.0)
HEMOGLOBIN: 8.4 g/dL — AB (ref 13.0–17.0)
MCH: 31.6 pg (ref 26.0–34.0)
MCHC: 33.3 g/dL (ref 30.0–36.0)
MCV: 94.7 fL (ref 78.0–100.0)
Platelets: 342 10*3/uL (ref 150–400)
RBC: 2.66 MIL/uL — AB (ref 4.22–5.81)
RDW: 16.6 % — ABNORMAL HIGH (ref 11.5–15.5)
WBC: 4.8 10*3/uL (ref 4.0–10.5)

## 2016-01-18 NOTE — Discharge Summary (Signed)
Physician Discharge Summary  [name] UXN:[id number] DOB: Mar 18, 1961 DOA: 01/09/2016  PCP: Pcp Not In System  Admit date: 01/09/2016 Discharge date: 01/18/2016   Recommendations for Outpatient Follow-Up:   1. Close follow up with urology, nephrology and oncology   Discharge Diagnosis:   Principal Problem:   Pyelonephritis Active Problems:   Sepsis ([place])   Multiple myeloma ([place])   Hypertension   Diabetes mellitus without complication ([place])   CKD (chronic kidney disease), stage V ([place])   Hyperkalemia   Partial small bowel obstruction ([place])   Hydronephrosis of right kidney   Discharge disposition:  Home.  Discharge Condition: Improved.  Diet recommendation: renal  Wound care: None.   History of Present Illness:   [name] is a 55 y.o. male with medical history significant of multiple myeloma (on Revlimid), hypertension, GERD, gout, depression, CKD-V,  S/p of cholecystectomy last week, who presents with nausea, vomiting, abdominal pain and increased urinary frequency.  Patient states that he had cholecystectomy in [facility] last week. He has been doing fine until this morning when he started having nausea, vomiting, abdominal pain. He vomited twice without blood in vomitus. No diarrhea. Last bowel movement was in this afternoon. His abdominal pain is located in the midline of abdomen, constant, 3-4 out of 10 in severity, dull, nonradiating. Patient does not have subjective fever and chills, but his temperature is 102.7 on admission. He also reports increased urinary frequency, but no dysuria or burning on urination. He does not have chest pain, cough, shortness breath, unilateral weakness.   Hospital Course by Problem:   Sepsis ([place]) Secondary to acute pyelonephritis, Klebsiella UTI, Klebsiella bacteremia - Blood cultures and urine cultures reviewed, positive for Klebsiella -repeat blood cultures pending -IV Rocephin change to  cipro - fever resolved s/p stent for right hydronephrosis  Fever -see above -s/p stent- to follow up outpatient  Multiple myeloma [facility]) - follows with oncologist in [facility]. Holding revlimid  Acute on CKD (chronic kidney disease), stage V ([place]) - Patient was on dialysis until 2013 and was being monitored thereafter. He has a functioning AV fistula with baseline creatinine all 4-5. She recommends that patient would need dialysis. Also that patient lives in [place] and if he requires regular dialysis it might be difficult for him to take a bus to go to [facility] for dialysis 3 times a week. - improved- close follow up  Hypertension - Continue home medical regimen  Diabetes mellitus without complication ([place]) - Continue home medical regimen  Partial small bowel obstruction ([place]) - resolved, eating well  Status post recent cholecystectomy    Medical Consultants:    Urology  renal   Discharge Exam:   Vitals:   01/17/16 2056 01/18/16 0521  BP: (!) 154/77 138/69  Pulse: 76 71  Resp: 18 20  Temp: 98.2 F (36.8 C) 98.2 F (36.8 C)   Vitals:   01/17/16 0501 01/17/16 1351 01/17/16 2056 01/18/16 0521  BP: (!) 144/72 (!) 150/74 (!) 154/77 138/69  Pulse: 75 78 76 71  Resp: 18 18 18 20   Temp: 98.4 F (36.9 C) 97.9 F (36.6 C) 98.2 F (36.8 C) 98.2 F (36.8 C)  TempSrc: Oral Oral Oral Oral  SpO2: 99% 100% 98% 98%  Weight:      Height:        Gen:  NAD- eating and feeling better   The results of significant diagnostics from this hospitalization (including imaging, microbiology, ancillary and laboratory) are listed  below for reference.     Procedures and Diagnostic Studies:   Ct Abdomen Pelvis Wo Contrast  Result Date: 01/09/2016 CLINICAL DATA:  55 year old male with right side abdominal pain after recent cholecystectomy. Fever. Initial encounter. Stage IV multiple myeloma. EXAM: CT ABDOMEN AND PELVIS WITHOUT CONTRAST TECHNIQUE:  Multidetector CT imaging of the abdomen and pelvis was performed following the standard protocol without IV contrast. COMPARISON:  [facility] CT Abdomen and Pelvis 10/05/2008. FINDINGS: Respiratory motion artifact at the lung bases which appear clear. Cardiomegaly is new since 2010. No pericardial or pleural effusion. The visible spine appears intact. Chronic severe expansile bony lesions about the left acetabulum and affecting the bilateral pubic rami. These appears somewhat more healed/ sclerotic than on the 2010 exam. No new osseous lesion identified. No pelvic free fluid. Fairly decompressed rectum. Redundant sigmoid colon tracking into the right lower quadrant and mildly gas distended there, but otherwise negative. Mild diverticulosis of the left colon which is otherwise negative aside from retained stool. Mild gas and stool distension of the transverse colon. Fluid in the right colon. Negative retrocecal appendix. Negative terminal ileum. Multiple mildly dilated contrast containing proximal small bowel loops up to 3 cm diameter. There is however relatively gradual transition to decompressed distal small bowel loops which occurs in the left lower quadrant (series 2, image 66). No abdominal free air or free fluid. Stomach mildly distended with contrast. Negative duodenum. Gallbladder is now surgically absent. Surgical clips in the gallbladder fossa plus a small volume of intermediate density material. No biliary ductal enlargement identified. Negative noncontrast liver otherwise. Negative noncontrast spleen, pancreas and adrenal glands. However, both kidneys appear abnormal. There is right moderate to severe hydronephrosis with right para renal stranding. The proximal right ureter also appears enlarged and indistinct with periureteral stranding. However, the right ureter quickly tapers and no obstructing etiology is identified. There is mild to moderate left para renal stranding without  hydronephrosis or hydroureter. No abnormality of the left ureter. No lymphadenopathy. IMPRESSION: 1. Right hydronephrosis with perinephric and proximal periureteral inflammatory stranding, but no obstructing etiology identified. Mild to moderate left perinephric stranding without hydronephrosis. These findings are nonspecific, and an infected obstructed right kidney is difficult to exclude in this setting. Query abnormal urinalysis. 2. Surgically absent gallbladder with minimal postoperative changes in the gallbladder fossa and no adverse features. 3. Partial or early small bowel obstruction, but without abrupt transition point. There is a gradual transition to normal small bowel loops in the left lower quadrant. 4. Cardiomegaly, new since 2010. 5. Chronic expansile lytic lesions of the left pelvis and pubic rami compatible with stated history of multiple myeloma. Electronically Signed   By: Genevie Ann M.D.   On: 01/09/2016 20:54     Labs:   Basic Metabolic Panel:  Recent Labs Lab 01/15/16 0607 01/16/16 0527 01/16/16 1546 01/17/16 0556 01/18/16 0555  NA 138 137 137 137 139  K 4.5 5.3* 4.3 5.1 4.7  CL 105 105 104 107 108  CO2 23 23 25 22 24   GLUCOSE 93 85 105* 87 87  BUN 61* 67* 65* 63* 62*  CREATININE 7.66* 7.83* 7.54* 7.54* 7.10*  CALCIUM 8.3* 8.4* 8.5* 8.3* 8.5*  PHOS  --  5.3* 4.5 5.2*  --    GFR Estimated Creatinine Clearance: 16.4 mL/min (by C-G formula based on SCr of 7.1 mg/dL). Liver Function Tests:  Recent Labs Lab 01/16/16 0527 01/16/16 1546 01/17/16 0556  ALBUMIN 2.5* 2.4* 2.2*   No results for input(s):  LIPASE, AMYLASE in the last 168 hours. No results for input(s): AMMONIA in the last 168 hours. Coagulation profile No results for input(s): INR, PROTIME in the last 168 hours.  CBC:  Recent Labs Lab 01/15/16 0607 01/16/16 0527 01/17/16 0556 01/18/16 0555  WBC 8.0 7.5 6.0 4.8  HGB 8.1* 8.1* 7.6* 8.4*  HCT 24.5* 23.9* 22.4* 25.2*  MCV 93.9 94.1 92.2 94.7   PLT 170 230 269 342   Cardiac Enzymes: No results for input(s): CKTOTAL, CKMB, CKMBINDEX, TROPONINI in the last 168 hours. BNP: Invalid input(s): POCBNP CBG:  Recent Labs Lab 01/14/16 0733 01/15/16 0733 01/16/16 0732 01/16/16 2235 01/17/16 0739  GLUCAP 128* 87 88 100* 88   D-Dimer No results for input(s): DDIMER in the last 72 hours. Hgb A1c No results for input(s): HGBA1C in the last 72 hours. Lipid Profile No results for input(s): CHOL, HDL, LDLCALC, TRIG, CHOLHDL, LDLDIRECT in the last 72 hours. Thyroid function studies No results for input(s): TSH, T4TOTAL, T3FREE, THYROIDAB in the last 72 hours.  Invalid input(s): FREET3 Anemia work up No results for input(s): VITAMINB12, FOLATE, FERRITIN, TIBC, IRON, RETICCTPCT in the last 72 hours. Microbiology Recent Results (from the past 240 hour(s))  Urine culture     Status: Abnormal   Collection Time: 01/09/16  3:59 PM  Result Value Ref Range Status   Specimen Description URINE, CLEAN CATCH  Final   Special Requests NONE  Final   Culture >=100,000 COLONIES/mL KLEBSIELLA PNEUMONIAE (A)  Final   Report Status 01/12/2016 FINAL  Final   Organism ID, Bacteria KLEBSIELLA PNEUMONIAE (A)  Final      Susceptibility   Klebsiella pneumoniae - MIC*    AMPICILLIN >=32 RESISTANT Resistant     CEFAZOLIN <=4 SENSITIVE Sensitive     CEFTRIAXONE <=1 SENSITIVE Sensitive     CIPROFLOXACIN <=0.25 SENSITIVE Sensitive     GENTAMICIN <=1 SENSITIVE Sensitive     IMIPENEM <=0.25 SENSITIVE Sensitive     NITROFURANTOIN 128 RESISTANT Resistant     TRIMETH/SULFA <=20 SENSITIVE Sensitive     AMPICILLIN/SULBACTAM >=32 RESISTANT Resistant     PIP/TAZO 8 SENSITIVE Sensitive     Extended ESBL NEGATIVE Sensitive     * >=100,000 COLONIES/mL KLEBSIELLA PNEUMONIAE  Culture, blood (routine x 2)     Status: Abnormal   Collection Time: 01/09/16  7:05 PM  Result Value Ref Range Status   Specimen Description BLOOD RIGHT ANTECUBITAL  Final   Special  Requests BOTTLES DRAWN AEROBIC AND ANAEROBIC 5CC  Final   Culture  Setup Time   Final    GRAM NEGATIVE RODS IN BOTH AEROBIC AND ANAEROBIC BOTTLES CRITICAL RESULT CALLED TO, READ BACK BY AND VERIFIED WITH: C SHADE,PHARMD AT 1010 01/10/16 BY Ronnie Derby Performed at [facility] (A)  Final   Report Status 01/12/2016 FINAL  Final   Organism ID, Bacteria KLEBSIELLA PNEUMONIAE  Final      Susceptibility   Klebsiella pneumoniae - MIC*    AMPICILLIN >=32 RESISTANT Resistant     CEFAZOLIN <=4 SENSITIVE Sensitive     CEFEPIME <=1 SENSITIVE Sensitive     CEFTAZIDIME <=1 SENSITIVE Sensitive     CEFTRIAXONE <=1 SENSITIVE Sensitive     CIPROFLOXACIN <=0.25 SENSITIVE Sensitive     GENTAMICIN <=1 SENSITIVE Sensitive     IMIPENEM 0.5 SENSITIVE Sensitive     TRIMETH/SULFA <=20 SENSITIVE Sensitive     AMPICILLIN/SULBACTAM >=32 RESISTANT Resistant     PIP/TAZO 8 SENSITIVE Sensitive  Extended ESBL NEGATIVE Sensitive     * KLEBSIELLA PNEUMONIAE  Blood Culture ID Panel (Reflexed)     Status: Abnormal   Collection Time: 01/09/16  7:05 PM  Result Value Ref Range Status   Enterococcus species NOT DETECTED NOT DETECTED Final   Vancomycin resistance NOT DETECTED NOT DETECTED Final   Listeria monocytogenes NOT DETECTED NOT DETECTED Final   Staphylococcus species NOT DETECTED NOT DETECTED Final   Staphylococcus aureus NOT DETECTED NOT DETECTED Final   Methicillin resistance NOT DETECTED NOT DETECTED Final   Streptococcus species NOT DETECTED NOT DETECTED Final   Streptococcus agalactiae NOT DETECTED NOT DETECTED Final   Streptococcus pneumoniae NOT DETECTED NOT DETECTED Final   Streptococcus pyogenes NOT DETECTED NOT DETECTED Final   Acinetobacter baumannii NOT DETECTED NOT DETECTED Final   Enterobacteriaceae species DETECTED (A) NOT DETECTED Final    Comment: CRITICAL RESULT CALLED TO, READ BACK BY AND VERIFIED WITH: C. Shade Pharm.D. 10:10 01/10/16 ([email or web address])     Enterobacter cloacae complex NOT DETECTED NOT DETECTED Final   Escherichia coli NOT DETECTED NOT DETECTED Final   Klebsiella oxytoca NOT DETECTED NOT DETECTED Final   Klebsiella pneumoniae DETECTED (A) NOT DETECTED Final    Comment: CRITICAL RESULT CALLED TO, READ BACK BY AND VERIFIED WITH: C. Shade Pharm.D. 10:10 01/10/16  ([email or web address])    Proteus species NOT DETECTED NOT DETECTED Final   Serratia marcescens NOT DETECTED NOT DETECTED Final   Carbapenem resistance NOT DETECTED NOT DETECTED Final   Haemophilus influenzae NOT DETECTED NOT DETECTED Final   Neisseria meningitidis NOT DETECTED NOT DETECTED Final   Pseudomonas aeruginosa NOT DETECTED NOT DETECTED Final   Candida albicans NOT DETECTED NOT DETECTED Final   Candida glabrata NOT DETECTED NOT DETECTED Final   Candida krusei NOT DETECTED NOT DETECTED Final   Candida parapsilosis NOT DETECTED NOT DETECTED Final   Candida tropicalis NOT DETECTED NOT DETECTED Final  Culture, blood (routine x 2)     Status: Abnormal   Collection Time: 01/09/16  7:06 PM  Result Value Ref Range Status   Specimen Description BLOOD RIGHT HAND  Final   Special Requests IN PEDIATRIC BOTTLE 2CC  Final   Culture  Setup Time   Final    GRAM NEGATIVE RODS IN PEDIATRIC BOTTLE CRITICAL RESULT CALLED TO, READ BACK BY AND VERIFIED WITH: T SHADE,PHARMD AT 1010 01/10/16 BY M WILSON    Culture (A)  Final    KLEBSIELLA PNEUMONIAE SUSCEPTIBILITIES PERFORMED ON PREVIOUS CULTURE WITHIN THE LAST 5 DAYS. Performed at [facility]    Report Status 01/12/2016 FINAL  Final  MRSA PCR Screening     Status: None   Collection Time: 01/10/16 12:33 AM  Result Value Ref Range Status   MRSA by PCR NEGATIVE NEGATIVE Final    Comment:        The GeneXpert MRSA Assay (FDA approved for NASAL specimens only), is one component of a comprehensive MRSA colonization surveillance program. It is not intended to diagnose MRSA infection nor to guide or monitor treatment for MRSA  infections.   Culture, blood (Routine X 2) w Reflex to ID Panel     Status: None (Preliminary result)   Collection Time: 01/15/16  3:25 PM  Result Value Ref Range Status   Specimen Description BLOOD RIGHT HAND  Final   Special Requests IN PEDIATRIC BOTTLE 1 CC  Final   Culture   Final    NO GROWTH 2 DAYS Performed at [facility]    Report Status PENDING  Incomplete  Culture, blood (Routine X 2) w Reflex to ID Panel     Status: None (Preliminary result)   Collection Time: 01/15/16  6:47 PM  Result Value Ref Range Status   Specimen Description BLOOD RIGHT ANTECUBITAL  Final   Special Requests IN PEDIATRIC BOTTLE 1CC  Final   Culture   Final    NO GROWTH 1 DAY Performed at [facility]    Report Status PENDING  Incomplete  Surgical pcr screen     Status: None   Collection Time: 01/16/16  6:24 PM  Result Value Ref Range Status   MRSA, PCR NEGATIVE NEGATIVE Final   Staphylococcus aureus NEGATIVE NEGATIVE Final    Comment:        The Xpert SA Assay (FDA approved for NASAL specimens in patients over 66 years of age), is one component of a comprehensive surveillance program.  Test performance has been validated by [facility] for patients greater than or equal to 3 year old. It is not intended to diagnose infection nor to guide or monitor treatment.      Discharge Instructions:   Discharge Instructions    Diet - low sodium heart healthy    Complete by:  As directed   Discharge instructions    Complete by:  As directed   Renal diet -close follow up with both renal Dr as well as multiple myeloma Dr Will also need to see urology for follow up of Stent   Increase activity slowly    Complete by:  As directed   Increase activity slowly    Complete by:  As directed       Medication List    STOP taking these medications   cephALEXin 500 MG capsule Commonly known as:  KEFLEX     TAKE these medications   amLODipine 2.5 MG tablet Commonly known as:   NORVASC Take 2.5 mg by mouth daily.   calcitRIOL 0.5 MCG capsule Commonly known as:  ROCALTROL Take 0.5 mcg by mouth as directed. Take 0.68mcg daily except on Saturday and Sunday take 0.61mcg twice daily   cetirizine 10 MG tablet Commonly known as:  ZYRTEC Take 10 mg by mouth daily.   ciprofloxacin 500 MG tablet Commonly known as:  CIPRO Take 1 tablet (500 mg total) by mouth daily with breakfast.   Co-Enzyme Q-10 30 MG Caps Take 30 mg by mouth daily.   doxazosin 4 MG tablet Commonly known as:  CARDURA Take 4 mg by mouth at bedtime.   escitalopram 10 MG tablet Commonly known as:  LEXAPRO Take 10 mg by mouth at bedtime.   febuxostat 40 MG tablet Commonly known as:  ULORIC Take 40 mg by mouth daily.   fluticasone 50 MCG/ACT nasal spray Commonly known as:  FLONASE Place 1 spray into the nose daily as needed for allergies.   GOODSENSE ASPIRIN 325 MG tablet Generic drug:  aspirin Take 325 mg by mouth at bedtime.   HYDROcodone-acetaminophen 5-325 MG tablet Commonly known as:  NORCO/VICODIN Take 1-2 tablets by mouth every 4 (four) hours as needed for pain.   magnesium oxide 400 MG tablet Commonly known as:  MAG-OX Take 1 tablet (400 mg total) by mouth 2 (two) times daily.   NEPHRO-VITE PO Take 1 tablet by mouth daily.   omeprazole 20 MG capsule Commonly known as:  PRILOSEC Take 20 mg by mouth daily.   ondansetron 4 MG disintegrating tablet Commonly known as:  ZOFRAN ODT Take 1 tablet (4 mg total) by  mouth every 8 (eight) hours as needed for nausea or vomiting.   promethazine 25 MG tablet Commonly known as:  PHENERGAN Take 1 tablet (25 mg total) by mouth every 6 (six) hours as needed for nausea or vomiting.   REVLIMID 5 MG capsule Generic drug:  lenalidomide Take 5 mg by mouth at bedtime. On for x21 days then off for 7 days   senna 8.6 MG tablet Commonly known as:  SENOKOT Take 1 tablet by mouth every evening.   sodium bicarbonate 650 MG tablet Take 2  tablets (1,300 mg total) by mouth 4 (four) times daily.      Follow-up Information    Call today Claybon Jabs, MD.   Specialty:  Urology Why:  For an appointment in 1-2 weeks when you get home. Contact information: [place] [address] [phone]        Ardeth Sportsman, MD .   Specialty:  Hematology and Oncology       Cay Schillings, MD .   Specialty:  Unknown Physician Specialty Contact information: [address], [address] [place] [address] [phone]            Time coordinating discharge: 35 min  Signed:  JESSICA Alison Stalling   [facility] 01/18/2016, 3:30 PM

## 2016-01-18 NOTE — Progress Notes (Signed)
Nutrition Brief Note  Patient identified for LOS.  Wt Readings from Last 15 Encounters:  01/16/16 280 lb 9.6 oz (127.3 kg)    Body mass index is 37.02 kg/m. Patient meets criteria for obesity based on current BMI. No skin issues.   Current diet order is Regular, patient is consuming 75-100% of meals. Labs and medications reviewed.   Per MD note this AM, possible d/c today; no order or summary in at this time. No nutrition interventions warranted at this time. If nutrition issues arise, please consult RD.     Jarome Matin, MS, RD, LDN Inpatient Clinical Dietitian Pager # [phone] After hours/weekend pager # [phone]

## 2016-01-18 NOTE — Progress Notes (Signed)
Patient ID: [name], male   DOB: 12-Feb-1961, 55 y.o.   MRN: [id number]  Assessment: Right ureteral obstruction - I discussed the intraoperative findings with the patient.  Dr. Alinda Money had informed me that there was not any evidence of extrinsic obstruction but there was a filling defect within the ureter.  I have discussed this with the patient and the possible etiologies.  This will need to be evaluated further with ureteroscopy and biopsy as it did not appear to be a stone.  This will be performed as an outpatient. His repeat blood cultures have been negative.  He is tolerating his stent. Here has not been a significant improvement in his creatinine over the past 24 hours although it has improved just slightly.  Plan: 1.  He will maintain his stent upon discharge. 2.  His follow-up will be in our office to schedule ureteroscopy.    Subjective: Patient reports o new urologic complaints this morning.  Objective: Vital signs in last 24 hours: Temp:  [97.9 F (36.6 C)-98.2 F (36.8 C)] 98.2 F (36.8 C) (08/22 0521) Pulse Rate:  [71-78] 71 (08/22 0521) Resp:  [18-20] 20 (08/22 0521) BP: (138-154)/(69-77) 138/69 (08/22 0521) SpO2:  [98 %-100 %] 98 % (08/22 0521)A  Intake/Output from previous day: 08/21 0701 - 08/22 0700 In: 1248.3 [P.O.:720; I.V.:328.3; IV Piggyback:200] Out: 2620 [Urine:3375] Intake/Output this shift: No intake/output data recorded.  Past Medical History:  Diagnosis Date  . Diabetes mellitus without complication ([place])   . Hypertension   . Multiple myeloma ([place])   . Renal disorder     Physical Exam:  Lungs - Normal respiratory effort, chest expands symmetrically.  Abdomen - Soft, non-tender & non-distended.  Lab Results:  Recent Labs  01/16/16 0527 01/17/16 0556 01/18/16 0555  WBC 7.5 6.0 4.8  HGB 8.1* 7.6* 8.4*  HCT 23.9* 22.4* 25.2*   BMET  Recent Labs  01/17/16 0556 01/18/16 0555  NA 137 139  K 5.1 4.7  CL 107 108  CO2 22 24   GLUCOSE 87 87  BUN 63* 62*  CREATININE 7.54* 7.10*  CALCIUM 8.3* 8.5*   No results for input(s): LABURIN in the last 72 hours. Results for orders placed or performed during the hospital encounter of 01/09/16  Urine culture     Status: Abnormal   Collection Time: 01/09/16  3:59 PM  Result Value Ref Range Status   Specimen Description URINE, CLEAN CATCH  Final   Special Requests NONE  Final   Culture >=100,000 COLONIES/mL KLEBSIELLA PNEUMONIAE (A)  Final   Report Status 01/12/2016 FINAL  Final   Organism ID, Bacteria KLEBSIELLA PNEUMONIAE (A)  Final      Susceptibility   Klebsiella pneumoniae - MIC*    AMPICILLIN >=32 RESISTANT Resistant     CEFAZOLIN <=4 SENSITIVE Sensitive     CEFTRIAXONE <=1 SENSITIVE Sensitive     CIPROFLOXACIN <=0.25 SENSITIVE Sensitive     GENTAMICIN <=1 SENSITIVE Sensitive     IMIPENEM <=0.25 SENSITIVE Sensitive     NITROFURANTOIN 128 RESISTANT Resistant     TRIMETH/SULFA <=20 SENSITIVE Sensitive     AMPICILLIN/SULBACTAM >=32 RESISTANT Resistant     PIP/TAZO 8 SENSITIVE Sensitive     Extended ESBL NEGATIVE Sensitive     * >=100,000 COLONIES/mL KLEBSIELLA PNEUMONIAE  Culture, blood (routine x 2)     Status: Abnormal   Collection Time: 01/09/16  7:05 PM  Result Value Ref Range Status   Specimen Description BLOOD RIGHT ANTECUBITAL  Final   Special  Requests BOTTLES DRAWN AEROBIC AND ANAEROBIC 5CC  Final   Culture  Setup Time   Final    GRAM NEGATIVE RODS IN BOTH AEROBIC AND ANAEROBIC BOTTLES CRITICAL RESULT CALLED TO, READ BACK BY AND VERIFIED WITH: C SHADE,PHARMD AT 1010 01/10/16 BY Ronnie Derby Performed at [facility] (A)  Final   Report Status 01/12/2016 FINAL  Final   Organism ID, Bacteria KLEBSIELLA PNEUMONIAE  Final      Susceptibility   Klebsiella pneumoniae - MIC*    AMPICILLIN >=32 RESISTANT Resistant     CEFAZOLIN <=4 SENSITIVE Sensitive     CEFEPIME <=1 SENSITIVE Sensitive     CEFTAZIDIME <=1 SENSITIVE  Sensitive     CEFTRIAXONE <=1 SENSITIVE Sensitive     CIPROFLOXACIN <=0.25 SENSITIVE Sensitive     GENTAMICIN <=1 SENSITIVE Sensitive     IMIPENEM 0.5 SENSITIVE Sensitive     TRIMETH/SULFA <=20 SENSITIVE Sensitive     AMPICILLIN/SULBACTAM >=32 RESISTANT Resistant     PIP/TAZO 8 SENSITIVE Sensitive     Extended ESBL NEGATIVE Sensitive     * KLEBSIELLA PNEUMONIAE  Blood Culture ID Panel (Reflexed)     Status: Abnormal   Collection Time: 01/09/16  7:05 PM  Result Value Ref Range Status   Enterococcus species NOT DETECTED NOT DETECTED Final   Vancomycin resistance NOT DETECTED NOT DETECTED Final   Listeria monocytogenes NOT DETECTED NOT DETECTED Final   Staphylococcus species NOT DETECTED NOT DETECTED Final   Staphylococcus aureus NOT DETECTED NOT DETECTED Final   Methicillin resistance NOT DETECTED NOT DETECTED Final   Streptococcus species NOT DETECTED NOT DETECTED Final   Streptococcus agalactiae NOT DETECTED NOT DETECTED Final   Streptococcus pneumoniae NOT DETECTED NOT DETECTED Final   Streptococcus pyogenes NOT DETECTED NOT DETECTED Final   Acinetobacter baumannii NOT DETECTED NOT DETECTED Final   Enterobacteriaceae species DETECTED (A) NOT DETECTED Final    Comment: CRITICAL RESULT CALLED TO, READ BACK BY AND VERIFIED WITH: C. Shade Pharm.D. 10:10 01/10/16 ([email or web address])    Enterobacter cloacae complex NOT DETECTED NOT DETECTED Final   Escherichia coli NOT DETECTED NOT DETECTED Final   Klebsiella oxytoca NOT DETECTED NOT DETECTED Final   Klebsiella pneumoniae DETECTED (A) NOT DETECTED Final    Comment: CRITICAL RESULT CALLED TO, READ BACK BY AND VERIFIED WITH: C. Shade Pharm.D. 10:10 01/10/16  ([email or web address])    Proteus species NOT DETECTED NOT DETECTED Final   Serratia marcescens NOT DETECTED NOT DETECTED Final   Carbapenem resistance NOT DETECTED NOT DETECTED Final   Haemophilus influenzae NOT DETECTED NOT DETECTED Final   Neisseria meningitidis NOT DETECTED NOT DETECTED Final    Pseudomonas aeruginosa NOT DETECTED NOT DETECTED Final   Candida albicans NOT DETECTED NOT DETECTED Final   Candida glabrata NOT DETECTED NOT DETECTED Final   Candida krusei NOT DETECTED NOT DETECTED Final   Candida parapsilosis NOT DETECTED NOT DETECTED Final   Candida tropicalis NOT DETECTED NOT DETECTED Final  Culture, blood (routine x 2)     Status: Abnormal   Collection Time: 01/09/16  7:06 PM  Result Value Ref Range Status   Specimen Description BLOOD RIGHT HAND  Final   Special Requests IN PEDIATRIC BOTTLE 2CC  Final   Culture  Setup Time   Final    GRAM NEGATIVE RODS IN PEDIATRIC BOTTLE CRITICAL RESULT CALLED TO, READ BACK BY AND VERIFIED WITH: T SHADE,PHARMD AT 1010 01/10/16 BY M WILSON    Culture (A)  Final    KLEBSIELLA  PNEUMONIAE SUSCEPTIBILITIES PERFORMED ON PREVIOUS CULTURE WITHIN THE LAST 5 DAYS. Performed at [facility]    Report Status 01/12/2016 FINAL  Final  MRSA PCR Screening     Status: None   Collection Time: 01/10/16 12:33 AM  Result Value Ref Range Status   MRSA by PCR NEGATIVE NEGATIVE Final    Comment:        The GeneXpert MRSA Assay (FDA approved for NASAL specimens only), is one component of a comprehensive MRSA colonization surveillance program. It is not intended to diagnose MRSA infection nor to guide or monitor treatment for MRSA infections.   Culture, blood (Routine X 2) w Reflex to ID Panel     Status: None (Preliminary result)   Collection Time: 01/15/16  3:25 PM  Result Value Ref Range Status   Specimen Description BLOOD RIGHT HAND  Final   Special Requests IN PEDIATRIC BOTTLE 1 CC  Final   Culture   Final    NO GROWTH 2 DAYS Performed at [facility]    Report Status PENDING  Incomplete  Culture, blood (Routine X 2) w Reflex to ID Panel     Status: None (Preliminary result)   Collection Time: 01/15/16  6:47 PM  Result Value Ref Range Status   Specimen Description BLOOD RIGHT ANTECUBITAL  Final   Special Requests IN  PEDIATRIC BOTTLE [place]  Final   Culture   Final    NO GROWTH 1 DAY Performed at [facility]    Report Status PENDING  Incomplete  Surgical pcr screen     Status: None   Collection Time: 01/16/16  6:24 PM  Result Value Ref Range Status   MRSA, PCR NEGATIVE NEGATIVE Final   Staphylococcus aureus NEGATIVE NEGATIVE Final    Comment:        The Xpert SA Assay (FDA approved for NASAL specimens in patients over 79 years of age), is one component of a comprehensive surveillance program.  Test performance has been validated by [facility] for patients greater than or equal to 56 year old. It is not intended to diagnose infection nor to guide or monitor treatment.     Studies/Results: No results found.    Juanitta Earnhardt C 01/18/2016, 7:15 AM

## 2016-01-18 NOTE — Progress Notes (Signed)
Saw patient this AM-- much improved.  No fever last 24 hours.  Cr improved.  Plan to d/c later today if ok with nephrology. Eulogio Bear

## 2016-01-18 NOTE — Progress Notes (Signed)
Patient given discharge, follow up, and medication instructions, verbalized understanding, IV removed, family to transport home 

## 2016-01-18 NOTE — Anesthesia Postprocedure Evaluation (Signed)
Anesthesia Post Note  Patient: [name]  Procedure(s) Performed: Procedure(s) (LRB): CYSTOSCOPY WITH STENT PLACEMENT (Right)  Patient location during evaluation: PACU Anesthesia Type: General Level of consciousness: awake and alert Pain management: pain level controlled Vital Signs Assessment: post-procedure vital signs reviewed and stable Respiratory status: spontaneous breathing, nonlabored ventilation, respiratory function stable and patient connected to nasal cannula oxygen Cardiovascular status: blood pressure returned to baseline and stable Postop Assessment: no signs of nausea or vomiting Anesthetic complications: no    Last Vitals:  Vitals:   01/17/16 2056 01/18/16 0521  BP: (!) 154/77 138/69  Pulse: 76 71  Resp: 18 20  Temp: 36.8 C 36.8 C    Last Pain:  Vitals:   01/18/16 0521  TempSrc: Oral  PainSc:                  [name]

## 2016-01-18 NOTE — Progress Notes (Signed)
Patient ID: [name], male   DOB: 05/25/1961, 55 y.o.   MRN: [id number] S: no c/o's  O:BP 138/69 (BP Location: Right Arm)   Pulse 71   Temp 98.2 F (36.8 C) (Oral)   Resp 20   Ht 6' 1" (1.854 m)   Wt 127.3 kg (280 lb 9.6 oz)   SpO2 98%   BMI 37.02 kg/m   Intake/Output Summary (Last 24 hours) at 01/18/16 1452 Last data filed at 01/18/16 1321  Gross per 24 hour  Intake          1128.33 ml  Output             3250 ml  Net         -2121.67 ml   Intake/Output: I/O last 3 completed shifts: In: 1498.3 [P.O.:720; I.V.:578.3; IV Piggyback:200] Out: 4125 [Urine:4125]  Intake/Output this shift:  Total I/O In: -  Out: 900 [Urine:900] Weight change:  QTM:AUQJF AAM in NAD CVS:no rub Resp:cta Abd:RUQ Tenderness, +bs Ext:no edema   Recent Labs Lab 01/13/16 0550 01/14/16 0553 01/15/16 0607 01/16/16 0527 01/16/16 1546 01/17/16 0556 01/18/16 0555  NA 137 138 138 137 137 137 139  K 4.2 4.2 4.5 5.3* 4.3 5.1 4.7  CL 105 105 105 105 104 107 108  CO2 _0 GLUCOSE 115* 95 93 85 105* 87 87  BUN 58* 57* 61* 67* 65* 63* 62*  CREATININE 7.59* 7.89* 7.66* 7.83* 7.54* 7.54* 7.10*  ALBUMIN  --   --   --  2.5* 2.4* 2.2*  --   CALCIUM 8.1* 8.2* 8.3* 8.4* 8.5* 8.3* 8.5*  PHOS  --   --   --  5.3* 4.5 5.2*  --    Liver Function Tests:  Recent Labs Lab 01/16/16 0527 01/16/16 1546 01/17/16 0556  ALBUMIN 2.5* 2.4* 2.2*   No results for input(s): LIPASE, AMYLASE in the last 168 hours. No results for input(s): AMMONIA in the last 168 hours. CBC:  Recent Labs Lab 01/15/16 0607 01/16/16 0527 01/17/16 0556 01/18/16 0555  WBC 8.0 7.5 6.0 4.8  HGB 8.1* 8.1* 7.6* 8.4*  HCT 24.5* 23.9* 22.4* 25.2*  MCV 93.9 94.1 92.2 94.7  PLT 170 230 269 342   Cardiac Enzymes: No results for input(s): CKTOTAL, CKMB, CKMBINDEX, TROPONINI in the last 168 hours. CBG:  Recent Labs Lab 01/14/16 0733 01/15/16 0733 01/16/16 0732 01/16/16 2235 01/17/16 0739  GLUCAP 128*  87 88 100* 88    Iron Studies: No results for input(s): IRON, TIBC, TRANSFERRIN, FERRITIN in the last 72 hours. Studies/Results: No results found. Marland Kitchen amLODipine  2.5 mg Oral Daily  . aspirin  325 mg Oral QHS  . calcitRIOL  0.5 mcg Oral Once per day on Mon Tue Wed Thu Fri  . calcitRIOL  0.5 mcg Oral 2 times per day on Sun Sat  . ciprofloxacin  400 mg Intravenous Q24H  . doxazosin  4 mg Oral QHS  . escitalopram  10 mg Oral QHS  . febuxostat  40 mg Oral Daily  . fluticasone  1 spray Each Nare Daily  . loratadine  10 mg Oral Daily  . multivitamin  1 tablet Oral Daily  . pantoprazole  40 mg Oral Daily  . pneumococcal 23 valent vaccine  0.5 mL Intramuscular Tomorrow-1000  . senna  1 tablet Oral QPM  . sodium bicarbonate  1,300 mg Oral QID  . sodium chloride flush  3 mL Intravenous Q12H    BMET  Component Value Date/Time   NA 139 01/18/2016 0555   K 4.7 01/18/2016 0555   CL 108 01/18/2016 0555   CO2 24 01/18/2016 0555   GLUCOSE 87 01/18/2016 0555   BUN 62 (H) 01/18/2016 0555   CREATININE 7.10 (H) 01/18/2016 0555   CALCIUM 8.5 (L) 01/18/2016 0555   CALCIUM 7.2 (L) 07/16/2007 1456   GFRNONAA 8 (L) 01/18/2016 0555   GFRAA 9 (L) 01/18/2016 0555   CBC    Component Value Date/Time   WBC 4.8 01/18/2016 0555   RBC 2.66 (L) 01/18/2016 0555   HGB 8.4 (L) 01/18/2016 0555   HCT 25.2 (L) 01/18/2016 0555   PLT 342 01/18/2016 0555   MCV 94.7 01/18/2016 0555   MCH 31.6 01/18/2016 0555   MCHC 33.3 01/18/2016 0555   RDW 16.6 (H) 01/18/2016 0555   LYMPHSABS 0.2 (L) 01/11/2016 0330   MONOABS 0.2 01/11/2016 0330   EOSABS 0.0 01/11/2016 0330   BASOSABS 0.0 01/11/2016 0330     Assessment/Plan: 1. AKI/CKD stage 4- in setting of klebsiella urosepsis w R hydronephrosis and pyelonephritis. Baseline creat 4's - 5's per pt. Peak creat 7.9 and down to 7.1 today on dc, not uremic and has f/u appt in 3 wks w his nephrologist, Dr. Donette Larry on Sept 14th. Called her office and updated his chart.  HD  not indicated. Renal function should continue to improve w stenting of L ureter and abx for pyelo.   2. 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 Multiple myeloma- on revlimid 5. Vascular access LUE AVF mature 6. Disposition- ok for dc from a renal standpoint.  F/u Dr Joesph July in 3 wks   Kelly Splinter MD [facility] pager [phone]    cell [phone] 01/18/2016, 2:57 PM

## 2016-01-20 LAB — CULTURE, BLOOD (ROUTINE X 2): Culture: NO GROWTH

## 2016-01-21 LAB — CULTURE, BLOOD (ROUTINE X 2): CULTURE: NO GROWTH

## 2016-02-03 ENCOUNTER — Other Ambulatory Visit: Payer: Self-pay | Admitting: Urology

## 2016-02-17 ENCOUNTER — Encounter (HOSPITAL_BASED_OUTPATIENT_CLINIC_OR_DEPARTMENT_OTHER): Payer: Self-pay | Admitting: *Deleted

## 2016-02-18 ENCOUNTER — Encounter (HOSPITAL_BASED_OUTPATIENT_CLINIC_OR_DEPARTMENT_OTHER): Payer: Self-pay | Admitting: *Deleted

## 2016-02-18 NOTE — Progress Notes (Addendum)
NPO AFTER MN.  ARRIVE AT 0600.  NEEDS ISTAT 8 .  CURRENT EKG IN CHART AND EPIC.  WILL TAKE PRILOSEC, TOPROL, AND NORVASC AM DOS W/ SIPS OF WATER.   ADDENDUM:   REVIEWED CHART W/ DR GERMEROTH, MDA.  STATED OK TO PROCEED.

## 2016-02-18 NOTE — H&P (Signed)
HPI: [name] is a 55 year-old male established patient who is here for hydronephrosis (Stent Inserted).  His stent was placed 01/16/2016 . The hydronephrosis was on the right side. The stent was placed for a ureteral mass. Patient denies kidney stone, ureteral stone, ureteral stricture, compression from mass, and blood clot.   He had the following imaging studies done: CT Scan.   He is not currently having flank pain, back pain, groin pain, nausea, vomiting, fever or chills. He does not have urgency. He does not have frequency. He does not have dysuria.   He was found to have hydronephrosis on the right side without an obstructing stone or retro-peritoneal mass. At the time of his stent placement a retrograde pyelogram revealed a filling defect in the proximal ureter.   He reports he is doing well and tolerating his stent.     ALLERGIES: None   MEDICATIONS: Revlimid 5 mg capsule     GU PSH: Cystoscopy Insert Stent, Right - 01/16/2016    NON-GU PSH: None   GU PMH: Kidney Failure, acute, Unspec    NON-GU PMH: Essential (primary) hypertension Type 1 diabetes mellitus without complications    FAMILY HISTORY: None   SOCIAL HISTORY: Marital Status: Single Current Smoking Status: Patient has never smoked.  Does not drink anymore.  Drinks 2 caffeinated drinks per day.    REVIEW OF SYSTEMS:    GU Review Male:   Patient reports get up at night to urinate. Patient denies frequent urination, hard to postpone urination, burning/ pain with urination, leakage of urine, stream starts and stops, trouble starting your stream, have to strain to urinate , erection problems, and penile pain.  Gastrointestinal (Upper):   Patient reports nausea, vomiting, and indigestion/ heartburn.   Gastrointestinal (Lower):   Patient reports diarrhea and constipation.   Constitutional:   Patient reports fever and fatigue. Patient denies night sweats and weight loss.  Skin:   Patient reports itching.  Patient denies skin rash/ lesion.  Eyes:   Patient denies blurred vision and double vision.  Ears/ Nose/ Throat:   Patient reports sore throat and sinus problems.   Hematologic/Lymphatic:   Patient denies swollen glands and easy bruising.  Cardiovascular:   Patient reports leg swelling. Patient denies chest pains.  Respiratory:   Patient reports cough and shortness of breath.   Endocrine:   Patient denies excessive thirst.  Musculoskeletal:   Patient reports back pain and joint pain.   Neurological:   Patient denies headaches and dizziness.  Psychologic:   Patient denies depression and anxiety.   VITAL SIGNS:      Weight 275 lb / 124.74 kg  Height 73 in / 185.42 cm  BP 139/68 mmHg  Pulse 71 /min  BMI 36.3 kg/m   PE: Constitutional:  Alert and oriented, No acute distress Cardiovascular: normal peripheral perfusion Respiratory: Normal respiratory effort  GI: Abdomen is soft, tender in LUQ, nondistended. Well healed surgical incisions GU: No CVA tenderness Neurologic: Grossly intact, no focal deficits Psychiatric: Normal mood and affect  PAST DATA REVIEWED:  Source Of History:  Patient, Outside Source  Records Review:   Previous Hospital Records, Previous Patient Records, POC Tool   PROCEDURES:          Urinalysis w/Scope - 81001 Dipstick Dipstick Cont'd Micro  Specimen: Voided Bilirubin: Neg WBC/hpf: 0-5/hpf  Color: Amber Ketones: Neg RBC/hpf: 40-60/hpf  Appearance: Cloudy Blood: 3+ Bacteria: NS (Not Seen)  Specific Gravity: 1.025 Protein: 3+ Cystals: NS (Not Seen)  pH: 5.5  Urobilinogen: 0.2 Casts: Hyaline  Glucose: Neg Nitrites: Neg Trichomonas: Not Present    Leukocyte Esterase: 3+ Mucous: Present      Epithelial Cells: 0-5/hpf      Yeast: NS (Not Seen)      Sperm: Not Present    ASSESSMENT:       1 GU:   Hydronephrosis Unspec - N13.30 Stable - We discussed the need to evaluate and potentially treat the cause of obstruction of his right ureter. We discussed the  possible causes and he understands that if this is due to a malignant lesion may not be something that can be completely treated ureteroscopically and may require further procedures. We discussed the procedure planned in detail including its risks and complications, the alternatives, the probability of success, the outpatient nature of the procedure, the need for stent postoperatively and the postoperative course.  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 I have discussed that with the patient.     PLAN:  Right ureteral stent removal with ureteroscopy and biopsy with possible laser versus Bugbee ablation and replacement of stent.

## 2016-02-20 NOTE — Anesthesia Preprocedure Evaluation (Addendum)
Anesthesia Evaluation  Patient identified by MRN, date of birth, ID band Patient awake    Reviewed: Allergy & Precautions, NPO status , Patient's Chart, lab work & pertinent test results  History of Anesthesia Complications Negative for: history of anesthetic complications  Airway Mallampati: I  TM Distance: >3 FB Neck ROM: Full    Dental  (+) Teeth Intact, Dental Advisory Given   Pulmonary    breath sounds clear to auscultation       Cardiovascular hypertension,  Rhythm:Regular Rate:Normal     Neuro/Psych negative neurological ROS  negative psych ROS   GI/Hepatic GERD  ,  Endo/Other  diabetes, Well Controlled  Renal/GU Renal disease     Musculoskeletal  (+) Arthritis ,   Abdominal (+) + obese,   Peds  Hematology  (+) anemia , multple myeloma   Anesthesia Other Findings   Reproductive/Obstetrics                            Anesthesia Physical Anesthesia Plan  ASA: III  Anesthesia Plan: General   Post-op Pain Management:    Induction: Intravenous  Airway Management Planned: LMA  Additional Equipment:   Intra-op Plan:   Post-operative Plan:   Informed Consent: I have reviewed the patients History and Physical, chart, labs and discussed the procedure including the risks, benefits and alternatives for the proposed anesthesia with the patient or authorized representative who has indicated his/her understanding and acceptance.   Dental advisory given  Plan Discussed with: CRNA  Anesthesia Plan Comments:         Anesthesia Quick Evaluation

## 2016-02-21 ENCOUNTER — Ambulatory Visit (HOSPITAL_BASED_OUTPATIENT_CLINIC_OR_DEPARTMENT_OTHER)
Admission: RE | Admit: 2016-02-21 | Discharge: 2016-02-21 | Disposition: A | Discharge status: Home / Self Care | Payer: Medicaid Other | Source: Ambulatory Visit | Attending: Urology | Admitting: Urology

## 2016-02-21 ENCOUNTER — Ambulatory Visit (HOSPITAL_BASED_OUTPATIENT_CLINIC_OR_DEPARTMENT_OTHER): Payer: Medicaid Other | Admitting: Anesthesiology

## 2016-02-21 ENCOUNTER — Encounter (HOSPITAL_BASED_OUTPATIENT_CLINIC_OR_DEPARTMENT_OTHER): Payer: Self-pay | Admitting: *Deleted

## 2016-02-21 ENCOUNTER — Encounter (HOSPITAL_BASED_OUTPATIENT_CLINIC_OR_DEPARTMENT_OTHER)
Admission: RE | Disposition: A | Discharge status: Home / Self Care | Payer: Self-pay | Source: Ambulatory Visit | Attending: Urology

## 2016-02-21 DIAGNOSIS — Z6835 Body mass index (BMI) 35.0-35.9, adult: Secondary | ICD-10-CM | POA: Diagnosis not present

## 2016-02-21 DIAGNOSIS — K219 Gastro-esophageal reflux disease without esophagitis: Secondary | ICD-10-CM | POA: Diagnosis not present

## 2016-02-21 DIAGNOSIS — Z8579 Personal history of other malignant neoplasms of lymphoid, hematopoietic and related tissues: Secondary | ICD-10-CM | POA: Diagnosis not present

## 2016-02-21 DIAGNOSIS — Z7982 Long term (current) use of aspirin: Secondary | ICD-10-CM | POA: Diagnosis not present

## 2016-02-21 DIAGNOSIS — N1330 Unspecified hydronephrosis: Secondary | ICD-10-CM

## 2016-02-21 DIAGNOSIS — Z79899 Other long term (current) drug therapy: Secondary | ICD-10-CM | POA: Insufficient documentation

## 2016-02-21 DIAGNOSIS — N131 Hydronephrosis with ureteral stricture, not elsewhere classified: Secondary | ICD-10-CM | POA: Diagnosis not present

## 2016-02-21 DIAGNOSIS — E109 Type 1 diabetes mellitus without complications: Secondary | ICD-10-CM | POA: Insufficient documentation

## 2016-02-21 DIAGNOSIS — I10 Essential (primary) hypertension: Secondary | ICD-10-CM | POA: Insufficient documentation

## 2016-02-21 DIAGNOSIS — E669 Obesity, unspecified: Secondary | ICD-10-CM | POA: Insufficient documentation

## 2016-02-21 HISTORY — DX: Crossing vessel and stricture of ureter without hydronephrosis: N13.5

## 2016-02-21 HISTORY — DX: Unspecified osteoarthritis, unspecified site: M19.90

## 2016-02-21 HISTORY — DX: Anemia, unspecified: D64.9

## 2016-02-21 HISTORY — DX: Drug or chemical induced diabetes mellitus without complications: E09.9

## 2016-02-21 HISTORY — DX: Personal history of diseases of the skin and subcutaneous tissue: Z87.2

## 2016-02-21 HISTORY — DX: Arteriovenous fistula, acquired: I77.0

## 2016-02-21 HISTORY — DX: Personal history of other infectious and parasitic diseases: Z86.19

## 2016-02-21 HISTORY — DX: Gastro-esophageal reflux disease without esophagitis: K21.9

## 2016-02-21 HISTORY — DX: Personal history of other diseases of the digestive system: Z87.19

## 2016-02-21 HISTORY — DX: Gout, unspecified: M10.9

## 2016-02-21 HISTORY — DX: Other specified disorders of kidney and ureter: N28.89

## 2016-02-21 HISTORY — DX: Adverse effect of glucocorticoids and synthetic analogues, initial encounter: T38.0X5A

## 2016-02-21 HISTORY — DX: Chronic kidney disease, stage 5: N18.5

## 2016-02-21 HISTORY — PX: CYSTOSCOPY WITH URETEROSCOPY AND STENT PLACEMENT: SHX6377

## 2016-02-21 HISTORY — DX: Disorder of kidney and ureter, unspecified: N28.9

## 2016-02-21 LAB — POCT I-STAT, CHEM 8
BUN: 30 mg/dL — ABNORMAL HIGH (ref 6–20)
Calcium, Ion: 1.18 mmol/L (ref 1.15–1.40)
Chloride: 106 mmol/L (ref 101–111)
Creatinine, Ser: 5.8 mg/dL — ABNORMAL HIGH (ref 0.61–1.24)
Glucose, Bld: 100 mg/dL — ABNORMAL HIGH (ref 65–99)
HCT: 34 % — ABNORMAL LOW (ref 39.0–52.0)
Hemoglobin: 11.6 g/dL — ABNORMAL LOW (ref 13.0–17.0)
Potassium: 4.3 mmol/L (ref 3.5–5.1)
Sodium: 143 mmol/L (ref 135–145)
TCO2: 22 mmol/L (ref 0–100)

## 2016-02-21 SURGERY — CYSTOURETEROSCOPY, WITH STENT INSERTION
Anesthesia: General | Site: Ureter | Laterality: Right

## 2016-02-21 MED ORDER — ONDANSETRON HCL 4 MG/2ML IJ SOLN
INTRAMUSCULAR | Status: AC
  Filled 2016-02-21: qty 2

## 2016-02-21 MED ORDER — CEFAZOLIN SODIUM-DEXTROSE 2-4 GM/100ML-% IV SOLN
2.0000 g | INTRAVENOUS | Status: AC
  Administered 2016-02-21: 2 g via INTRAVENOUS
  Filled 2016-02-21: qty 100

## 2016-02-21 MED ORDER — LIDOCAINE 2% (20 MG/ML) 5 ML SYRINGE
INTRAMUSCULAR | Status: AC
Start: 2016-02-21 — End: 2016-02-21
  Filled 2016-02-21: qty 5

## 2016-02-21 MED ORDER — IOHEXOL 300 MG/ML  SOLN
INTRAMUSCULAR | Status: DC | PRN
  Administered 2016-02-21: 14 mL

## 2016-02-21 MED ORDER — DEXAMETHASONE SODIUM PHOSPHATE 10 MG/ML IJ SOLN
INTRAMUSCULAR | Status: AC
  Filled 2016-02-21: qty 1

## 2016-02-21 MED ORDER — EPHEDRINE SULFATE 50 MG/ML IJ SOLN
INTRAMUSCULAR | Status: DC | PRN
  Administered 2016-02-21: 10 mg via INTRAVENOUS
  Administered 2016-02-21 (×2): 5 mg via INTRAVENOUS
  Administered 2016-02-21: 10 mg via INTRAVENOUS

## 2016-02-21 MED ORDER — FENTANYL CITRATE (PF) 100 MCG/2ML IJ SOLN
INTRAMUSCULAR | Status: AC
  Filled 2016-02-21: qty 2

## 2016-02-21 MED ORDER — LIDOCAINE 2% (20 MG/ML) 5 ML SYRINGE
INTRAMUSCULAR | Status: DC | PRN
  Administered 2016-02-21: 100 mg via INTRAVENOUS

## 2016-02-21 MED ORDER — SODIUM CHLORIDE 0.9 % IV SOLN
INTRAVENOUS | Status: DC
  Administered 2016-02-21: 07:00:00 via INTRAVENOUS
  Filled 2016-02-21: qty 1000

## 2016-02-21 MED ORDER — EPHEDRINE 5 MG/ML INJ
INTRAVENOUS | Status: AC
  Filled 2016-02-21: qty 10

## 2016-02-21 MED ORDER — ONDANSETRON HCL 4 MG/2ML IJ SOLN
INTRAMUSCULAR | Status: DC | PRN
Start: 2016-02-21 — End: 2016-02-21
  Administered 2016-02-21: 4 mg via INTRAVENOUS

## 2016-02-21 MED ORDER — SODIUM CHLORIDE 0.9 % IR SOLN
Status: DC | PRN
  Administered 2016-02-21: 4000 mL

## 2016-02-21 MED ORDER — PROPOFOL 10 MG/ML IV BOLUS
INTRAVENOUS | Status: DC | PRN
  Administered 2016-02-21: 180 mg via INTRAVENOUS
  Administered 2016-02-21: 50 mg via INTRAVENOUS

## 2016-02-21 MED ORDER — HYDROCODONE-ACETAMINOPHEN 10-325 MG PO TABS: 1.0000 | ORAL_TABLET | ORAL | 0 refills | Status: DC | PRN

## 2016-02-21 MED ORDER — MIDAZOLAM HCL 5 MG/5ML IJ SOLN
INTRAMUSCULAR | Status: DC | PRN
  Administered 2016-02-21: 2 mg via INTRAVENOUS

## 2016-02-21 MED ORDER — PHENAZOPYRIDINE HCL 200 MG PO TABS: 200.0000 mg | ORAL_TABLET | Freq: Three times a day (TID) | ORAL | 0 refills | Status: DC | PRN

## 2016-02-21 MED ORDER — MIDAZOLAM HCL 2 MG/2ML IJ SOLN
INTRAMUSCULAR | Status: AC
  Filled 2016-02-21: qty 2

## 2016-02-21 MED ORDER — CEFAZOLIN SODIUM-DEXTROSE 2-4 GM/100ML-% IV SOLN
INTRAVENOUS | Status: AC
  Filled 2016-02-21: qty 100

## 2016-02-21 MED ORDER — DEXAMETHASONE SODIUM PHOSPHATE 4 MG/ML IJ SOLN
INTRAMUSCULAR | Status: DC | PRN
  Administered 2016-02-21: 10 mg via INTRAVENOUS

## 2016-02-21 MED ORDER — PROPOFOL 10 MG/ML IV BOLUS
INTRAVENOUS | Status: AC
  Filled 2016-02-21: qty 40

## 2016-02-21 SURGICAL SUPPLY — 40 items
BAG DRAIN URO-CYSTO SKYTR STRL (DRAIN) ×2 IMPLANT
BAG DRN UROCATH (DRAIN) ×1
BASKET LASER NITINOL 1.9FR (BASKET) IMPLANT
BASKET STNLS GEMINI 4WIRE 3FR (BASKET) IMPLANT
BASKET ZERO TIP NITINOL 2.4FR (BASKET) ×1 IMPLANT
BRUSH URET BIOPSY 3F (UROLOGICAL SUPPLIES) ×2 IMPLANT
BSKT STON RTRVL 120 1.9FR (BASKET)
BSKT STON RTRVL GEM 120X11 3FR (BASKET)
BSKT STON RTRVL ZERO TP 2.4FR (BASKET) ×1
CATH INTERMIT  6FR 70CM (CATHETERS) ×1 IMPLANT
CATH URET 5FR 28IN CONE TIP (BALLOONS)
CATH URET 5FR 70CM CONE TIP (BALLOONS) IMPLANT
CLOTH BEACON ORANGE TIMEOUT ST (SAFETY) ×2 IMPLANT
ELECT REM PT RETURN 9FT ADLT (ELECTROSURGICAL)
ELECTRODE REM PT RTRN 9FT ADLT (ELECTROSURGICAL) IMPLANT
FIBER LASER FLEXIVA 365 (UROLOGICAL SUPPLIES) IMPLANT
FIBER LASER FLEXIVA 550 (UROLOGICAL SUPPLIES) IMPLANT
FIBER LASER TRAC TIP (UROLOGICAL SUPPLIES) IMPLANT
GLOVE BIO SURGEON STRL SZ 6.5 (GLOVE) ×2 IMPLANT
GLOVE BIO SURGEON STRL SZ8 (GLOVE) ×2 IMPLANT
GLOVE INDICATOR 6.5 STRL GRN (GLOVE) ×3 IMPLANT
GOWN STRL REUS W/ TWL LRG LVL3 (GOWN DISPOSABLE) ×1 IMPLANT
GOWN STRL REUS W/ TWL XL LVL3 (GOWN DISPOSABLE) ×1 IMPLANT
GOWN STRL REUS W/TWL LRG LVL3 (GOWN DISPOSABLE) ×2
GOWN STRL REUS W/TWL XL LVL3 (GOWN DISPOSABLE) ×1 IMPLANT
GUIDEWIRE 0.038 PTFE COATED (WIRE) IMPLANT
GUIDEWIRE ANG ZIPWIRE 038X150 (WIRE) IMPLANT
GUIDEWIRE STR DUAL SENSOR (WIRE) ×4 IMPLANT
IV NS IRRIG 3000ML ARTHROMATIC (IV SOLUTION) ×4 IMPLANT
KIT BALLIN UROMAX 15FX10 (LABEL) IMPLANT
KIT BALLN UROMAX 15FX4 (MISCELLANEOUS) IMPLANT
KIT BALLN UROMAX 26 75X4 (MISCELLANEOUS)
KIT ROOM TURNOVER WOR (KITS) ×2 IMPLANT
MANIFOLD NEPTUNE II (INSTRUMENTS) ×1 IMPLANT
PACK CYSTO (CUSTOM PROCEDURE TRAY) ×2 IMPLANT
SET HIGH PRES BAL DIL (LABEL)
SHEATH ACCESS URETERAL 38CM (SHEATH) IMPLANT
STENT URET 6FRX28 CONTOUR (STENTS) ×1 IMPLANT
TUBE CONNECTING 12X1/4 (SUCTIONS) ×1 IMPLANT
WATER STERILE IRR 3000ML UROMA (IV SOLUTION) IMPLANT

## 2016-02-21 NOTE — Transfer of Care (Signed)
Immediate Anesthesia Transfer of Care Note  Patient: [name]  Procedure(s) Performed: Procedure(s): CYSTOSCOPY WITH RIGHT URETEROSCOPY, RIGHT RGP, RIGHT URETERAL BIOPSY  AND STENT PLACEMENT (Right)  Patient Location: PACU  Anesthesia Type:General  Level of Consciousness: awake, alert , oriented and patient cooperative  Airway & Oxygen Therapy: Patient Spontanous Breathing and Patient connected to nasal cannula oxygen  Post-op Assessment: Report given to RN and Post -op Vital signs reviewed and stable  Post vital signs: Reviewed and stable  Last Vitals:  Vitals:   02/21/16 0647 02/21/16 0848  BP: (!) 152/76   Pulse: 68 (P) 71  Resp: 16 (P) 15  Temp: 37.1 C     Last Pain:  Vitals:   02/21/16 0647  TempSrc: Oral      Patients Stated Pain Goal: 4 ([id number])  Complications: No apparent anesthesia complications

## 2016-02-21 NOTE — Discharge Instructions (Signed)
Post Anesthesia Home Care Instructions  Activity: Get plenty of rest for the remainder of the day. A responsible adult should stay with you for 24 hours following the procedure.  For the next 24 hours, DO NOT: -Drive a car -Paediatric nurse -Drink alcoholic beverages -Take any medication unless instructed by your physician -Make any legal decisions or sign important papers.  Meals: Start with liquid foods such as gelatin or soup. Progress to regular foods as tolerated. Avoid greasy, spicy, heavy foods. If nausea and/or vomiting occur, drink only clear liquids until the nausea and/or vomiting subsides. Call your physician if vomiting continues.  Special Instructions/Symptoms: Your throat may feel dry or sore from the anesthesia or the breathing tube placed in your throat during surgery. If this causes discomfort, gargle with warm salt water. The discomfort should disappear within 24 hours.  If you had a scopolamine patch placed behind your ear for the management of post- operative nausea and/or vomiting:  1. The medication in the patch is effective for 72 hours, after which it should be removed.  Wrap patch in a tissue and discard in the trash. Wash hands thoroughly with soap and water. 2. You may remove the patch earlier than 72 hours if you experience unpleasant side effects which may include dry mouth, dizziness or visual disturbances. 3. Avoid touching the patch. Wash your hands with soap and water after contact with the patch.   Post ureteroscopy with biopsy and stent placement surgery instructions   Definitions:  Ureter: The duct that transports urine from the kidney to the bladder. Stent: A plastic hollow tube that is placed into the ureter, from the kidney to the bladder to prevent the ureter from swelling shut.  General instructions:  Despite the fact that no skin incisions were used, the area around the ureter and bladder is raw and irritated. The stent is a foreign body  which will further irritate the bladder wall. This irritation is manifested by increased frequency of urination, both day and night, and by an increase in the urge to urinate. In some, the urge to urinate is present almost always. Sometimes the urge is strong enough that you may not be able to stop your self from urinating. The only real cure is to remove the stent and then give time for the bladder wall to heal which can't be done until the danger of the ureter swelling shut has passed. (This varies from 2-21 days).  You may see some blood in your urine while the stent is in place and a few days afterward. Do not be alarmed, even if the urine is clear for a while. Get off your feet and drink lots of fluids until clearing occurs. If you start to pass clots or don't improve, call [name].  If you have a string coming from your urethra:  The stent string is attached to your ureteral stent.  Do not pull on thisIf you have a string coming from your urethra:  The stent string is attached to your ureteral stent.  Do not pull on this.  Diet:  You may return to your normal diet immediately. Because of the raw surface of your bladder, alcohol, spicy foods, foods high in acid and drinks with caffeine may cause irritation or frequency and should be used in moderation. To keep your urine flowing freely and avoid constipation, drink plenty of fluids during the day (8-10 glasses). Tip: Avoid cranberry juice because it is very acidic.  Activity:  Your physical activity doesn't  need to be restricted. However, if you are very active, you may see some blood in the urine. We suggest that you reduce your activity under the circumstances until the bleeding has stopped.  Bowels:  It is important to keep your bowels regular during the postoperative period. Straining with bowel movements can cause bleeding. A bowel movement every other day is reasonable. Use a mild laxative if needed, such as milk of magnesia 2-3 tablespoons, or  2 Dulcolax tablets. Call if you continue to have problems. If you had been taking narcotics for pain, before, during or after your surgery, you may be constipated. Take a laxative if necessary.     Medication:  You should resume your pre-surgery medications unless told not to. DO NOT RESUME YOUR ASPIRIN, or any other medicines like ibuprofen, motrin, excedrin, advil, aleve, vitamin E, fish oil as these can all cause bleeding x 7 days. In addition you may be given an antibiotic to prevent or treat infection. Antibiotics are not always necessary. All medication should be taken as prescribed until the bottles are finished unless you are having an unusual reaction to one of the drugs.  Problems you should report to [name]:  a. Fever greater than 101F. b. Heavy bleeding, or clots (see notes above about blood in urine). c. Inability to urinate. d. Drug reactions (hives, rash, nausea, vomiting, diarrhea). e. Severe burning or pain with urination that is not improving.  Followup:  You will need a followup appointment to monitor your progress in most cases. Please call the office for this appointment when you get home if your appointment has not already been scheduled. Usually the first appointment will be about 5-14 days after your surgery and if you have a stent in place it will likely be removed at that time.

## 2016-02-21 NOTE — Anesthesia Procedure Notes (Signed)
Procedure Name: LMA Insertion Date/Time: 02/21/2016 7:49 AM Performed by: Wanita Chamberlain Pre-anesthesia Checklist: Patient identified, Timeout performed, Emergency Drugs available, Suction available and Patient being monitored Patient Re-evaluated:Patient Re-evaluated prior to inductionOxygen Delivery Method: Circle system utilized Preoxygenation: Pre-oxygenation with 100% oxygen Intubation Type: IV induction Ventilation: Mask ventilation without difficulty LMA: LMA inserted LMA Size: 4.0 Number of attempts: 1 Placement Confirmation: positive ETCO2 and breath sounds checked- equal and bilateral Tube secured with: Tape Dental Injury: Teeth and Oropharynx as per pre-operative assessment

## 2016-02-21 NOTE — Op Note (Signed)
PATIENT:  [name]  PRE-OPERATIVE DIAGNOSIS: 1. Right hydronephrosis 2. Ureteral filling defect  POST-OPERATIVE DIAGNOSIS: Same  PROCEDURE: 1. Cystoscopy with removal of right double-J stent 2. Right retrograde pyelogram with interpretation 3. Right ureteroscopy with ureteral biopsy 4. Right double-J stent replacement  SURGEON:  Claybon Jabs  INDICATION: [name] is a 55 year old male who was admitted to the hospital with a Klebsiella UTI and found to have hydronephrosis. No stone was noted on his CT scan. Cystoscopy with retrograde pyelogram revealed a filling defect that appeared rounded and located at the level of the L4 vertebral body. A stent was placed and he returns today for her evaluation of the filling defect with planned treatment based on I intraoperative findings.  ANESTHESIA:  General  EBL:  Minimal  DRAINS: 6 French, 26 cm double-J stent in the right ureter (no string)  LOCAL MEDICATIONS USED:  None  SPECIMEN:  Ureteral cold cup and brush biopsies to pathology  Description of procedure: After informed consent the patient was taken to the operating room and placed on the table in a supine position. General anesthesia was then administered. Once fully anesthetized the patient was moved to the dorsal lithotomy position and the genitalia were sterilely prepped and draped in standard fashion. An official timeout was then performed.  The 23 French rigid cystoscope was passed under direct vision down the urethra which is noted be normal. The prostatic urethra was nonobstructing and free of lesions. The bladder was then entered and fully and systematically inspected. No tumors, stones or inflammatory lesions were identified. A stent was noted to be exiting the right ureteral orifice and alligator graspers were used to grab the tip of the stent and extracted through the urethral meatus. I then passed a 0.038 inch floppy-tipped guidewire through the stent and up  the right ureter into the area the right renal pelvis and left this in place.  A right retrograde pyelogram was then performed by passing a 6 [place] open-ended ureteral catheter through the cystoscope and into the right ureteral orifice and up the right ureter for a short distance. Full-strength contrast was then injected under direct fluoroscopy however I could not identify any filling defects within the ureter. It appeared normal without evidence of dilatation or external compression. I passed a second guidewire through the open ended catheter and into the area the renal pelvis under fluoroscopy and then proceeded with ureteroscopy.  The 6 French flexible, digital ureteroscope was then passed over the guidewire and up the ureter under direct vision but I did not see any definite intraluminal lesions. I felt that this could have represented a lesion that may have somehow migrated back up into the kidney so I passed the ureteroscope all the way up into the kidney and then did a full and systematic inspection of each calyx and found no abnormality within any of the calyces or within the renal pelvis. I therefore backed the ureteroscope down the ureter under direct vision and at about the level of the L4 vertebral body I did note a slight indentation of the ureter with some slight irregularity of the mucosa but no definite mass. I repeated his retrograde pyelogram with the scope positioned just distal to this area and magnified the image. I still was unable to identify any filling defect but did note some slight mucosal irregularities at the location of the previous filling defect.  I passed the biopsy forceps through the ureteroscope and obtained a biopsy of the ureteral  wall where it appeared irregular. I then used a brush biopsy to brush the ureteral wall and sent both of these to pathology as right ureteral biopsy. There was no bleeding and the ureter remained intact I therefore passed a guidewire through  the ureteroscope and left that in place removing the ureteroscope and back loading the cystoscope over the guidewire. I then passed the stent over the guidewire into the area the renal pelvis and noted good curl in the area the renal pelvis and within the bladder as the guidewire was removed. The bladder was then drained and the patient was awakened and taken to recovery room in stable and satisfactory condition. He tolerated procedure well without complications.   PLAN OF CARE: Discharge to home after PACU  PATIENT DISPOSITION:  PACU - hemodynamically stable.

## 2016-02-21 NOTE — Anesthesia Postprocedure Evaluation (Signed)
Anesthesia Post Note  Patient: [name]  Procedure(s) Performed: Procedure(s) (LRB): CYSTOSCOPY WITH RIGHT URETEROSCOPY, RIGHT RGP, RIGHT URETERAL BIOPSY  AND STENT PLACEMENT (Right)  Patient location during evaluation: PACU Anesthesia Type: General Level of consciousness: awake and alert Pain management: pain level controlled Vital Signs Assessment: post-procedure vital signs reviewed and stable Respiratory status: spontaneous breathing, nonlabored ventilation, respiratory function stable and patient connected to nasal cannula oxygen Cardiovascular status: blood pressure returned to baseline and stable Postop Assessment: no signs of nausea or vomiting Anesthetic complications: no    Last Vitals:  Vitals:   02/21/16 0945 02/21/16 1000  BP: 136/73 (!) 141/78  Pulse: 71 68  Resp: 13 13  Temp:      Last Pain:  Vitals:   02/21/16 1023  TempSrc:   PainSc: 0-No pain                 [name],JAMES TERRILL

## 2016-02-22 ENCOUNTER — Encounter (HOSPITAL_BASED_OUTPATIENT_CLINIC_OR_DEPARTMENT_OTHER): Payer: Self-pay | Admitting: Urology

## 2017-01-11 ENCOUNTER — Encounter: Payer: Self-pay | Admitting: General Practice

## 2017-01-11 NOTE — Progress Notes (Signed)
[place] Spiritual Care Note  Follow [name] through Multiple Myeloma Support Group.  Provided emotional/social support, pastoral reflection, and encouragement when he stopped by [facility] today.  He utilizes Nature conservation officer and resources as desired and knows to contact us anytime with questions or concerns.   [place], [place], [facility] Pager [phone] Voicemail [phone]

## 2017-03-07 ENCOUNTER — Encounter (HOSPITAL_COMMUNITY): Payer: Self-pay | Admitting: Emergency Medicine

## 2017-03-07 ENCOUNTER — Ambulatory Visit (HOSPITAL_COMMUNITY)
Admission: EM | Admit: 2017-03-07 | Discharge: 2017-03-07 | Disposition: A | Discharge status: Home / Self Care | Payer: Medicaid Other | Attending: Family Medicine | Admitting: Family Medicine

## 2017-03-07 DIAGNOSIS — T148XXA Other injury of unspecified body region, initial encounter: Secondary | ICD-10-CM | POA: Diagnosis not present

## 2017-03-07 NOTE — ED Triage Notes (Signed)
PT has an abrasion to right knee from a fall 3 weeks ago.PT wants a wound check

## 2017-03-09 NOTE — ED Provider Notes (Signed)
[place]   [id number] 03/07/17 Arrival Time: [place]:  1. Skin avulsion    No sign of infection. Healing well. May f/u as needed  Reviewed expectations re: course of current medical issues. Questions answered. Outlined signs and symptoms indicating need for more acute intervention. Patient verbalized understanding. After Visit Summary given.   SUBJECTIVE:  [name] is a 56 y.o. male who presents with complaint of skin avulsion of R knee after falling and scraping area approximately 3 weeks ago. No redness or drainage. No pain. Afebrile. "My friends made me come in to get it checked." Keeping loose bandage over wound. Ambulatory without difficulty.  ROS: As per HPI.   OBJECTIVE:  Vitals:   03/07/17 1505 03/07/17 1507  BP:  (!) 152/89  Pulse:  68  Resp:  16  Temp:  98.5 F (36.9 C)  TempSrc:  Oral  SpO2:  95%  Weight: 280 lb (127 kg)     General appearance: alert; no distress Extremities: R knee with FROM; no swelling Skin: warm and dry; approx 2cm superficial and healing wound over R knee; no sign of infection Psychological: alert and cooperative; normal mood and affect   Allergies  Allergen Reactions  . Tape Rash    Paper tape only-- other tape is ok    Past Medical History:  Diagnosis Date  . Arthritis   . AV (arteriovenous fistula) (HCC)    currently left upper arm  . 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 CKD (chronic kidney disease), stage V [facility]) nephrologist-  dr Cay Schillings in [facility]  [phone])   first dx 2009  --  hemodialysis 2009 until 2013 then was being monitored  . GERD (gastroesophageal reflux disease)   . Gout    per pt stable as of 02-18-2016  . History of decubitus ulcer    2009 sacral-- resolved  . History of sepsis    01-09-2016 pyelonephrolitis   . History of small bowel obstruction    01-09-2016  partial sbo resolved without surgical intervention  . Hypertension   .  Lesion of right native ureter   . Multiple myeloma [facility]) dx  01/ 2009 by dr Beryle Beams  current oncologist in [facility],  dr vc  Cruzita Lederer   Stage IV IgG Kappa , involving bone extensively --  pallitive radiation to left hip and thoracic spine and oral chemo  . Obstruction of right ureter   . Steroid-induced diabetes [facility])    Social History   Social History  . Marital status: Single    Spouse name: N/A  . Number of children: N/A  . Years of education: N/A   Occupational History  . Not on file.   Social History Main Topics  . Smoking status: Never Smoker  . Smokeless tobacco: Never Used  . Alcohol use No  . Drug use: No  . Sexual activity: Not on file   Other Topics Concern  . Not on file   Social History Narrative  . No narrative on file   Family History  Problem Relation Age of Onset  . Hypertension Mother   . Hypertension Father   . Multiple myeloma Brother    Past Surgical History:  Procedure Laterality Date  . CYSTOSCOPY WITH STENT PLACEMENT Right 01/16/2016   Procedure: CYSTOSCOPY WITH STENT PLACEMENT;  Surgeon: Raynelle Bring, MD;  Location: [facility];  Service: Urology;  Laterality: Right;  . CYSTOSCOPY WITH URETEROSCOPY AND STENT PLACEMENT Right  02/21/2016   Procedure: CYSTOSCOPY WITH RIGHT URETEROSCOPY, RIGHT RGP, RIGHT URETERAL BIOPSY  AND STENT PLACEMENT;  Surgeon: Kathie Rhodes, MD;  Location: [facility];  Service: Urology;  Laterality: Right;  . DIALYSIS FISTULA CREATION  last one 07-10-2007  dr Scot Dock   left upper extremity  . KNEE ARTHROSCOPY Left 1999  . LAPAROSCOPIC CHOLECYSTECTOMY  12-31-2015   [facility]  . TRANSTHORACIC ECHOCARDIOGRAM  05/29/2007   mild LVH, ef 55%, abnormal septal motion/  mild AV calcification without stenosis/  trivial MR/  mild LAE/  mild TR     Vanessa Kick, MD 03/09/17 1652

## 2017-03-23 ENCOUNTER — Encounter (HOSPITAL_BASED_OUTPATIENT_CLINIC_OR_DEPARTMENT_OTHER): Payer: Medicaid Other | Attending: Internal Medicine

## 2017-03-23 DIAGNOSIS — S81011A Laceration without foreign body, right knee, initial encounter: Secondary | ICD-10-CM | POA: Diagnosis not present

## 2017-03-23 DIAGNOSIS — N184 Chronic kidney disease, stage 4 (severe): Secondary | ICD-10-CM | POA: Insufficient documentation

## 2017-03-23 DIAGNOSIS — I129 Hypertensive chronic kidney disease with stage 1 through stage 4 chronic kidney disease, or unspecified chronic kidney disease: Secondary | ICD-10-CM | POA: Insufficient documentation

## 2017-03-23 DIAGNOSIS — C9001 Multiple myeloma in remission: Secondary | ICD-10-CM | POA: Diagnosis not present

## 2017-03-23 DIAGNOSIS — Z79899 Other long term (current) drug therapy: Secondary | ICD-10-CM | POA: Insufficient documentation

## 2017-03-23 DIAGNOSIS — W19XXXA Unspecified fall, initial encounter: Secondary | ICD-10-CM | POA: Diagnosis not present

## 2017-03-23 DIAGNOSIS — Z86718 Personal history of other venous thrombosis and embolism: Secondary | ICD-10-CM | POA: Diagnosis not present

## 2017-03-23 DIAGNOSIS — Z9221 Personal history of antineoplastic chemotherapy: Secondary | ICD-10-CM | POA: Insufficient documentation

## 2017-06-21 ENCOUNTER — Telehealth: Payer: Self-pay | Admitting: Hematology

## 2017-06-21 NOTE — Telephone Encounter (Signed)
Received a call from [facility] from Dr. Evlyn Kanner office to schedule an appt. An appt has been scheduled for the pt to see Dr. Irene Limbo on 2/13 at [facility] will notify the pt. Records are in [place].

## 2017-06-29 HISTORY — PX: PORTA CATH INSERTION: CATH118285

## 2017-07-11 ENCOUNTER — Ambulatory Visit: Payer: Medicaid Other | Admitting: Hematology

## 2018-01-16 ENCOUNTER — Inpatient Hospital Stay (HOSPITAL_COMMUNITY)
Admission: EM | Admit: 2018-01-16 | Discharge: 2018-01-19 | DRG: 641 | Disposition: A | Discharge status: Home / Self Care | Payer: Medicaid Other | Attending: Student in an Organized Health Care Education/Training Program | Admitting: Student in an Organized Health Care Education/Training Program

## 2018-01-16 DIAGNOSIS — I10 Essential (primary) hypertension: Secondary | ICD-10-CM | POA: Diagnosis present

## 2018-01-16 DIAGNOSIS — C9000 Multiple myeloma not having achieved remission: Secondary | ICD-10-CM | POA: Diagnosis present

## 2018-01-16 DIAGNOSIS — D638 Anemia in other chronic diseases classified elsewhere: Secondary | ICD-10-CM | POA: Diagnosis present

## 2018-01-16 DIAGNOSIS — M1990 Unspecified osteoarthritis, unspecified site: Secondary | ICD-10-CM | POA: Diagnosis present

## 2018-01-16 DIAGNOSIS — E8770 Fluid overload, unspecified: Secondary | ICD-10-CM

## 2018-01-16 DIAGNOSIS — Z79899 Other long term (current) drug therapy: Secondary | ICD-10-CM

## 2018-01-16 DIAGNOSIS — Z91048 Other nonmedicinal substance allergy status: Secondary | ICD-10-CM

## 2018-01-16 DIAGNOSIS — Z8249 Family history of ischemic heart disease and other diseases of the circulatory system: Secondary | ICD-10-CM

## 2018-01-16 DIAGNOSIS — E119 Type 2 diabetes mellitus without complications: Secondary | ICD-10-CM | POA: Diagnosis present

## 2018-01-16 DIAGNOSIS — K219 Gastro-esophageal reflux disease without esophagitis: Secondary | ICD-10-CM | POA: Diagnosis present

## 2018-01-16 DIAGNOSIS — Z7982 Long term (current) use of aspirin: Secondary | ICD-10-CM

## 2018-01-16 DIAGNOSIS — E875 Hyperkalemia: Secondary | ICD-10-CM | POA: Diagnosis present

## 2018-01-16 DIAGNOSIS — Z923 Personal history of irradiation: Secondary | ICD-10-CM

## 2018-01-16 DIAGNOSIS — I120 Hypertensive chronic kidney disease with stage 5 chronic kidney disease or end stage renal disease: Secondary | ICD-10-CM | POA: Diagnosis present

## 2018-01-16 DIAGNOSIS — N185 Chronic kidney disease, stage 5: Secondary | ICD-10-CM | POA: Diagnosis present

## 2018-01-16 DIAGNOSIS — Z9221 Personal history of antineoplastic chemotherapy: Secondary | ICD-10-CM

## 2018-01-16 DIAGNOSIS — R0989 Other specified symptoms and signs involving the circulatory and respiratory systems: Secondary | ICD-10-CM | POA: Diagnosis present

## 2018-01-16 NOTE — ED Notes (Signed)
Pt declined EKG at this time.

## 2018-01-17 ENCOUNTER — Other Ambulatory Visit: Payer: Self-pay

## 2018-01-17 ENCOUNTER — Encounter (HOSPITAL_COMMUNITY): Payer: Self-pay | Admitting: Emergency Medicine

## 2018-01-17 ENCOUNTER — Emergency Department (HOSPITAL_COMMUNITY): Payer: Medicaid Other

## 2018-01-17 DIAGNOSIS — C9000 Multiple myeloma not having achieved remission: Secondary | ICD-10-CM | POA: Diagnosis present

## 2018-01-17 DIAGNOSIS — K219 Gastro-esophageal reflux disease without esophagitis: Secondary | ICD-10-CM | POA: Diagnosis present

## 2018-01-17 DIAGNOSIS — Z923 Personal history of irradiation: Secondary | ICD-10-CM | POA: Diagnosis not present

## 2018-01-17 DIAGNOSIS — E8770 Fluid overload, unspecified: Secondary | ICD-10-CM

## 2018-01-17 DIAGNOSIS — I132 Hypertensive heart and chronic kidney disease with heart failure and with stage 5 chronic kidney disease, or end stage renal disease: Secondary | ICD-10-CM

## 2018-01-17 DIAGNOSIS — D631 Anemia in chronic kidney disease: Secondary | ICD-10-CM

## 2018-01-17 DIAGNOSIS — M1990 Unspecified osteoarthritis, unspecified site: Secondary | ICD-10-CM | POA: Diagnosis present

## 2018-01-17 DIAGNOSIS — R0989 Other specified symptoms and signs involving the circulatory and respiratory systems: Secondary | ICD-10-CM | POA: Diagnosis present

## 2018-01-17 DIAGNOSIS — E1122 Type 2 diabetes mellitus with diabetic chronic kidney disease: Secondary | ICD-10-CM | POA: Diagnosis not present

## 2018-01-17 DIAGNOSIS — Z9221 Personal history of antineoplastic chemotherapy: Secondary | ICD-10-CM | POA: Diagnosis not present

## 2018-01-17 DIAGNOSIS — I770 Arteriovenous fistula, acquired: Secondary | ICD-10-CM

## 2018-01-17 DIAGNOSIS — N185 Chronic kidney disease, stage 5: Secondary | ICD-10-CM | POA: Diagnosis present

## 2018-01-17 DIAGNOSIS — I120 Hypertensive chronic kidney disease with stage 5 chronic kidney disease or end stage renal disease: Secondary | ICD-10-CM | POA: Diagnosis present

## 2018-01-17 DIAGNOSIS — D638 Anemia in other chronic diseases classified elsewhere: Secondary | ICD-10-CM | POA: Diagnosis present

## 2018-01-17 DIAGNOSIS — Z91048 Other nonmedicinal substance allergy status: Secondary | ICD-10-CM

## 2018-01-17 DIAGNOSIS — I5033 Acute on chronic diastolic (congestive) heart failure: Secondary | ICD-10-CM

## 2018-01-17 DIAGNOSIS — Z8249 Family history of ischemic heart disease and other diseases of the circulatory system: Secondary | ICD-10-CM | POA: Diagnosis not present

## 2018-01-17 DIAGNOSIS — E875 Hyperkalemia: Secondary | ICD-10-CM

## 2018-01-17 DIAGNOSIS — Z807 Family history of other malignant neoplasms of lymphoid, hematopoietic and related tissues: Secondary | ICD-10-CM

## 2018-01-17 DIAGNOSIS — Z9111 Patient's noncompliance with dietary regimen: Secondary | ICD-10-CM

## 2018-01-17 DIAGNOSIS — E119 Type 2 diabetes mellitus without complications: Secondary | ICD-10-CM | POA: Diagnosis present

## 2018-01-17 DIAGNOSIS — Z79899 Other long term (current) drug therapy: Secondary | ICD-10-CM | POA: Diagnosis not present

## 2018-01-17 DIAGNOSIS — Z7982 Long term (current) use of aspirin: Secondary | ICD-10-CM | POA: Diagnosis not present

## 2018-01-17 LAB — BASIC METABOLIC PANEL
ANION GAP: 11 (ref 5–15)
Anion gap: 9 (ref 5–15)
BUN: 45 mg/dL — ABNORMAL HIGH (ref 6–20)
BUN: 49 mg/dL — ABNORMAL HIGH (ref 6–20)
CALCIUM: 7.3 mg/dL — AB (ref 8.9–10.3)
CALCIUM: 7.5 mg/dL — AB (ref 8.9–10.3)
CO2: 18 mmol/L — ABNORMAL LOW (ref 22–32)
CO2: 22 mmol/L (ref 22–32)
CREATININE: 6.1 mg/dL — AB (ref 0.61–1.24)
Chloride: 105 mmol/L (ref 98–111)
Chloride: 107 mmol/L (ref 98–111)
Creatinine, Ser: 5.95 mg/dL — ABNORMAL HIGH (ref 0.61–1.24)
GFR calc non Af Amer: 9 mL/min — ABNORMAL LOW (ref >60)
GFR, EST AFRICAN AMERICAN: 11 mL/min — AB (ref >60)
GFR, EST AFRICAN AMERICAN: 11 mL/min — AB (ref >60)
GFR, EST NON AFRICAN AMERICAN: 9 mL/min — AB (ref >60)
GLUCOSE: 118 mg/dL — AB (ref 70–99)
GLUCOSE: 125 mg/dL — AB (ref 70–99)
Potassium: 6.1 mmol/L — ABNORMAL HIGH (ref 3.5–5.1)
Potassium: 6.2 mmol/L — ABNORMAL HIGH (ref 3.5–5.1)
SODIUM: 136 mmol/L (ref 135–145)
Sodium: 136 mmol/L (ref 135–145)

## 2018-01-17 LAB — I-STAT TROPONIN, ED: TROPONIN I, POC: 0.02 ng/mL (ref 0.00–0.08)

## 2018-01-17 LAB — CBC
HCT: 32.2 % — ABNORMAL LOW (ref 39.0–52.0)
HEMOGLOBIN: 10.3 g/dL — AB (ref 13.0–17.0)
MCH: 31.8 pg (ref 26.0–34.0)
MCHC: 32 g/dL (ref 30.0–36.0)
MCV: 99.4 fL (ref 78.0–100.0)
Platelets: 155 10*3/uL (ref 150–400)
RBC: 3.24 MIL/uL — ABNORMAL LOW (ref 4.22–5.81)
RDW: 14.6 % (ref 11.5–15.5)
WBC: 9.1 10*3/uL (ref 4.0–10.5)

## 2018-01-17 LAB — GLUCOSE, CAPILLARY
Glucose-Capillary: 136 mg/dL — ABNORMAL HIGH (ref 70–99)
Glucose-Capillary: 113 mg/dL — ABNORMAL HIGH (ref 70–99)

## 2018-01-17 LAB — POTASSIUM
POTASSIUM: 4.7 mmol/L (ref 3.5–5.1)
POTASSIUM: 4.6 mmol/L (ref 3.5–5.1)

## 2018-01-17 LAB — BRAIN NATRIURETIC PEPTIDE: B Natriuretic Peptide: 144.2 pg/mL — ABNORMAL HIGH (ref 0.0–100.0)

## 2018-01-17 MED ORDER — DOXAZOSIN MESYLATE 4 MG PO TABS
4.0000 mg | ORAL_TABLET | Freq: Every day | ORAL | Status: DC
  Administered 2018-01-17 – 2018-01-18 (×2): 4 mg via ORAL
  Filled 2018-01-17 (×3): qty 1
  Filled 2018-01-17: qty 2

## 2018-01-17 MED ORDER — DEXTROSE 50 % IV SOLN
1.0000 | Freq: Once | INTRAVENOUS | Status: AC
  Administered 2018-01-17: 50 mL via INTRAVENOUS
  Filled 2018-01-17: qty 50

## 2018-01-17 MED ORDER — AMLODIPINE BESYLATE 5 MG PO TABS
2.5000 mg | ORAL_TABLET | Freq: Every day | ORAL | Status: DC
  Administered 2018-01-17 – 2018-01-19 (×3): 2.5 mg via ORAL
  Filled 2018-01-17 (×4): qty 1

## 2018-01-17 MED ORDER — DEXTROSE 50 % IV SOLN
INTRAVENOUS | Status: AC
  Administered 2018-01-17: 50 mL via INTRAVENOUS
  Filled 2018-01-17: qty 50

## 2018-01-17 MED ORDER — MAGNESIUM OXIDE 400 MG PO TABS: 400.0000 mg | ORAL_TABLET | Freq: Two times a day (BID) | ORAL | Status: DC

## 2018-01-17 MED ORDER — SODIUM BICARBONATE 8.4 % IV SOLN
50.0000 meq | Freq: Once | INTRAVENOUS | Status: AC
  Administered 2018-01-17: 50 meq via INTRAVENOUS
  Filled 2018-01-17: qty 50

## 2018-01-17 MED ORDER — SENNA 8.6 MG PO TABS: 1.0000 | ORAL_TABLET | Freq: Every evening | ORAL | Status: DC | PRN

## 2018-01-17 MED ORDER — ACYCLOVIR 400 MG PO TABS
400.0000 mg | ORAL_TABLET | Freq: Every day | ORAL | Status: DC
  Administered 2018-01-17 – 2018-01-19 (×3): 400 mg via ORAL
  Filled 2018-01-17 (×3): qty 1

## 2018-01-17 MED ORDER — SODIUM CHLORIDE 0.9 % IV SOLN
1.0000 g | Freq: Once | INTRAVENOUS | Status: AC
  Administered 2018-01-17: 1 g via INTRAVENOUS
  Filled 2018-01-17: qty 10

## 2018-01-17 MED ORDER — FUROSEMIDE 10 MG/ML IJ SOLN
40.0000 mg | Freq: Once | INTRAMUSCULAR | Status: AC
  Administered 2018-01-17: 40 mg via INTRAVENOUS
  Filled 2018-01-17: qty 4

## 2018-01-17 MED ORDER — SODIUM POLYSTYRENE SULFONATE 15 GM/60ML PO SUSP
30.0000 g | Freq: Once | ORAL | Status: AC
  Administered 2018-01-17: 30 g via ORAL
  Filled 2018-01-17: qty 120

## 2018-01-17 MED ORDER — INSULIN ASPART 100 UNIT/ML ~~LOC~~ SOLN
SUBCUTANEOUS | Status: AC
  Administered 2018-01-17: 5 [IU] via INTRAVENOUS
  Filled 2018-01-17: qty 1

## 2018-01-17 MED ORDER — INSULIN ASPART 100 UNIT/ML IV SOLN
5.0000 [IU] | Freq: Once | INTRAVENOUS | Status: AC
  Administered 2018-01-17: 5 [IU] via INTRAVENOUS
  Filled 2018-01-17: qty 0.05

## 2018-01-17 MED ORDER — ALBUTEROL SULFATE (2.5 MG/3ML) 0.083% IN NEBU
INHALATION_SOLUTION | RESPIRATORY_TRACT | Status: AC
  Filled 2018-01-17: qty 3

## 2018-01-17 MED ORDER — SODIUM BICARBONATE 650 MG PO TABS
1300.0000 mg | ORAL_TABLET | Freq: Two times a day (BID) | ORAL | Status: DC
  Administered 2018-01-17 – 2018-01-19 (×5): 1300 mg via ORAL
  Filled 2018-01-17 (×5): qty 2

## 2018-01-17 MED ORDER — PANTOPRAZOLE SODIUM 40 MG PO TBEC: 40.0000 mg | DELAYED_RELEASE_TABLET | Freq: Every day | ORAL | Status: DC

## 2018-01-17 MED ORDER — HEPARIN SODIUM (PORCINE) 5000 UNIT/ML IJ SOLN
5000.0000 [IU] | Freq: Three times a day (TID) | INTRAMUSCULAR | Status: DC
  Administered 2018-01-17 – 2018-01-19 (×5): 5000 [IU] via SUBCUTANEOUS
  Filled 2018-01-17 (×5): qty 1

## 2018-01-17 MED ORDER — FUROSEMIDE 10 MG/ML IJ SOLN
60.0000 mg | Freq: Once | INTRAMUSCULAR | Status: AC
  Administered 2018-01-17: 60 mg via INTRAVENOUS
  Filled 2018-01-17: qty 6

## 2018-01-17 MED ORDER — ALBUTEROL SULFATE (2.5 MG/3ML) 0.083% IN NEBU
10.0000 mg | INHALATION_SOLUTION | Freq: Once | RESPIRATORY_TRACT | Status: AC
  Administered 2018-01-17: 10 mg via RESPIRATORY_TRACT
  Filled 2018-01-17: qty 12

## 2018-01-17 MED ORDER — CALCITRIOL 0.5 MCG PO CAPS
1.0000 ug | ORAL_CAPSULE | ORAL | Status: DC
  Administered 2018-01-19: 1 ug via ORAL

## 2018-01-17 MED ORDER — CALCITRIOL 0.5 MCG PO CAPS
0.5000 ug | ORAL_CAPSULE | ORAL | Status: DC
  Administered 2018-01-17 – 2018-01-18 (×2): 0.5 ug via ORAL
  Filled 2018-01-17 (×3): qty 1

## 2018-01-17 MED ORDER — METOPROLOL SUCCINATE ER 100 MG PO TB24
100.0000 mg | ORAL_TABLET | Freq: Every day | ORAL | Status: DC
  Administered 2018-01-17 – 2018-01-19 (×3): 100 mg via ORAL
  Filled 2018-01-17 (×3): qty 1

## 2018-01-17 MED ORDER — LORATADINE 10 MG PO TABS
10.0000 mg | ORAL_TABLET | Freq: Every day | ORAL | Status: DC
  Administered 2018-01-17 – 2018-01-19 (×3): 10 mg via ORAL
  Filled 2018-01-17 (×4): qty 1

## 2018-01-17 MED ORDER — ESCITALOPRAM OXALATE 20 MG PO TABS
20.0000 mg | ORAL_TABLET | Freq: Every day | ORAL | Status: DC
  Administered 2018-01-17 – 2018-01-19 (×3): 20 mg via ORAL
  Filled 2018-01-17: qty 1
  Filled 2018-01-17: qty 2
  Filled 2018-01-17: qty 1
  Filled 2018-01-17: qty 2

## 2018-01-17 NOTE — ED Triage Notes (Signed)
Pt presents with bilat lower leg swelling since 2 pm which he thinks was brought on by eating too much sugar and salts; pt also reports he has been outside a lot today and has not drank much water and thinks he is dehydrated

## 2018-01-17 NOTE — ED Provider Notes (Signed)
[name] EMERGENCY DEPARTMENT Provider Note   CSN: [id number] Arrival date & time: 01/16/18  2346     History   Chief Complaint Chief Complaint  Patient presents with  . Leg Swelling    HPI [name] is a 57 y.o. male.  The history is provided by the patient.  Illness  This is a recurrent problem. The current episode started yesterday. The problem occurs constantly. The problem has been gradually worsening. Associated symptoms include shortness of breath. Pertinent negatives include no chest pain, no abdominal pain and no headaches. Nothing aggravates the symptoms. Nothing relieves the symptoms. He has tried nothing for the symptoms. The treatment provided no relief.  Patient presents with SOB and leg swelling.  He has indulged in soda, [name], sliders from [place], and some doughouts.    Past Medical History:  Diagnosis Date  . Arthritis   . AV (arteriovenous fistula) (HCC)    currently left upper arm  . 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 CKD (chronic kidney disease), stage V [facility]) nephrologist-  dr Cay Schillings in [facility]  [phone])   first dx 2009  --  hemodialysis 2009 until 2013 then was being monitored  . GERD (gastroesophageal reflux disease)   . Gout    per pt stable as of 02-18-2016  . History of decubitus ulcer    2009 sacral-- resolved  . History of sepsis    01-09-2016 pyelonephrolitis   . History of small bowel obstruction    01-09-2016  partial sbo resolved without surgical intervention  . Hypertension   . Lesion of right native ureter   . Multiple myeloma [facility]) dx  01/ 2009 by dr Beryle Beams  current oncologist in [facility],  dr vc  Cruzita Lederer   Stage IV IgG Kappa , involving bone extensively --  pallitive radiation to left hip and thoracic spine and oral chemo  . Obstruction of right ureter   . Steroid-induced diabetes [facility])     Patient Active Problem List   Diagnosis Date Noted  .  Partial small bowel obstruction ([name]) 01/10/2016  . Hydronephrosis of right kidney 01/10/2016  . Sepsis ([place]) 01/09/2016  . CKD (chronic kidney disease), stage V ([place]) 01/09/2016  . Hyperkalemia 01/09/2016  . Pyelonephritis 01/09/2016  . Multiple myeloma ([place])   . Hypertension   . Diabetes mellitus without complication [facility])     Past Surgical History:  Procedure Laterality Date  . CYSTOSCOPY WITH STENT PLACEMENT Right 01/16/2016   Procedure: CYSTOSCOPY WITH STENT PLACEMENT;  Surgeon: Raynelle Bring, MD;  Location: [facility];  Service: Urology;  Laterality: Right;  . CYSTOSCOPY WITH URETEROSCOPY AND STENT PLACEMENT Right 02/21/2016   Procedure: CYSTOSCOPY WITH RIGHT URETEROSCOPY, RIGHT RGP, RIGHT URETERAL BIOPSY  AND STENT PLACEMENT;  Surgeon: Kathie Rhodes, MD;  Location: [facility];  Service: Urology;  Laterality: Right;  . DIALYSIS FISTULA CREATION  last one 07-10-2007  dr Scot Dock   left upper extremity  . KNEE ARTHROSCOPY Left 1999  . LAPAROSCOPIC CHOLECYSTECTOMY  12-31-2015   [facility]  . TRANSTHORACIC ECHOCARDIOGRAM  05/29/2007   mild LVH, ef 55%, abnormal septal motion/  mild AV calcification without stenosis/  trivial MR/  mild LAE/  mild TR        Home Medications    Prior to Admission medications   Medication Sig Start Date End Date Taking? Authorizing Provider  amLODipine (NORVASC) 2.5 MG tablet Take 2.5 mg by mouth every  morning.  05/11/15   [provider]  aspirin EC 325 MG tablet Take 325 mg by mouth at bedtime.    [provider]  B Complex-C-Folic Acid (NEPHRO-VITE PO) Take 1 tablet by mouth every morning.     [provider]  calcitRIOL (ROCALTROL) 0.5 MCG capsule Take 0.5 mcg by mouth as directed. Take 0.70mg daily except on  Tuesday, Thursday, and Sunday take 0.528m twice daily    [provider]  cetirizine (ZYRTEC) 10 MG tablet Take 10 mg by mouth daily.    [provider]  Coenzyme Q10 (COQ10)  200 MG CAPS Take 1 capsule by mouth daily.    [provider]  colchicine 0.6 MG tablet Take 0.6 mg by mouth 2 (two) times daily.    [provider]  doxazosin (CARDURA) 4 MG tablet Take 4 mg by mouth at bedtime.    [provider]  escitalopram (LEXAPRO) 20 MG tablet Take 20 mg by mouth daily.    [provider]  febuxostat (ULORIC) 40 MG tablet Take 40 mg by mouth every morning.     [provider]  fluticasone (FLONASE) 50 MCG/ACT nasal spray Place 1 spray into the nose daily as needed for allergies.     [provider]  HYDROcodone-acetaminophen (NORCO) 10-325 MG tablet Take 1-2 tablets by mouth every 4 (four) hours as needed for moderate pain. Maximum dose per 24 hours - 8 pills 02/21/16   OtKathie RhodesMD  HYDROcodone-acetaminophen (NORCO/VICODIN) 5-325 MG tablet Take 1-2 tablets by mouth every 4 (four) hours as needed for pain. 12/31/15   [provider]  lenalidomide (REVLIMID) 5 MG capsule Take 5 mg by mouth at bedtime. On for x21 days then off for 7 days 11/08/15   [provider]  magnesium oxide (MAG-OX) 400 MG tablet Take 1 tablet (400 mg total) by mouth 2 (two) times daily. 11/12/15   Pisciotta, NiElmyra RicksPA-C  metoprolol succinate (TOPROL-XL) 100 MG 24 hr tablet Take 100 mg by mouth every morning. Take with or immediately following a meal.    [provider]  omeprazole (PRILOSEC) 20 MG capsule Take 20 mg by mouth every morning.     [provider]  ondansetron (ZOFRAN ODT) 4 MG disintegrating tablet Take 1 tablet (4 mg total) by mouth every 8 (eight) hours as needed for nausea or vomiting. 11/12/15   Pisciotta, NiElmyra RicksPA-C  phenazopyridine (PYRIDIUM) 200 MG tablet Take 1 tablet (200 mg total) by mouth 3 (three) times daily as needed for pain. 02/21/16   OtKathie RhodesMD  senna (SENOKOT) 8.6 MG tablet Take 1 tablet by mouth every evening.     [provider]  sodium bicarbonate 650 MG tablet  Take 2 tablets (1,300 mg total) by mouth 4 (four) times daily. Patient taking differently: Take 1,300 mg by mouth 2 (two) times daily.  11/12/15   Pisciotta, NiElmyra RicksPA-C    Family History Family History  Problem Relation Age of Onset  . Hypertension Mother   . Hypertension Father   . Multiple myeloma Brother     Social History Social History   Tobacco Use  . Smoking status: Never Smoker  . Smokeless tobacco: Never Used  Substance Use Topics  . Alcohol use: No  . Drug use: No     Allergies   Tape   Review of Systems Review of Systems  Constitutional: Negative for diaphoresis and fever.  HENT: Negative for congestion, ear discharge and ear pain.  Eyes: Negative for photophobia.  Respiratory: Positive for shortness of breath.   Cardiovascular: Positive for leg swelling. Negative for chest pain and palpitations.  Gastrointestinal: Negative for abdominal pain.  Neurological: Negative for headaches.  All other systems reviewed and are negative.    Physical Exam Updated Vital Signs BP (!) 171/89 (BP Location: Right Arm)   Pulse 80   Temp 99.6 F (37.6 C) (Oral)   Resp 17   Ht 6' 1"  (1.854 m)   Wt (!) 146.1 kg   SpO2 100%   BMI 42.48 kg/m   Physical Exam  Constitutional: He is oriented to person, place, and time. He appears well-developed and well-nourished. No distress.  HENT:  Head: Normocephalic and atraumatic.  Mouth/Throat: No oropharyngeal exudate.  Eyes: Pupils are equal, round, and reactive to light. Conjunctivae are normal.  Neck: Normal range of motion. Neck supple. JVD present.  Cardiovascular: Normal rate, regular rhythm, normal heart sounds and intact distal pulses.  Pulmonary/Chest: He has rales.  Abdominal: He exhibits distension. He exhibits no mass. There is no tenderness. There is no rebound and no guarding.  Musculoskeletal: He exhibits edema. He exhibits no tenderness or deformity.  Neurological: He is alert and oriented to person, place,  and time. He displays abnormal reflex.  Skin: Skin is warm and dry. Capillary refill takes less than 2 seconds.  Psychiatric: He has a normal mood and affect.     ED Treatments / Results  Labs (all labs ordered are listed, but only abnormal results are displayed) Labs Reviewed  BASIC METABOLIC PANEL - Abnormal; Notable for the following components:      Result Value   Potassium 6.1 (*)    CO2 18 (*)    Glucose, Bld 125 (*)    BUN 45 (*)    Creatinine, Ser 5.95 (*)    Calcium 7.3 (*)    GFR calc non Af Amer 9 (*)    GFR calc Af Amer 11 (*)    All other components within normal limits  CBC - Abnormal; Notable for the following components:   RBC 3.24 (*)    Hemoglobin 10.3 (*)    HCT 32.2 (*)    All other components within normal limits  BRAIN NATRIURETIC PEPTIDE - Abnormal; Notable for the following components:   B Natriuretic Peptide 144.2 (*)    All other components within normal limits  I-STAT TROPONIN, ED    EKG EKG Interpretation  Date/Time:  Thursday January 17 2018 05:18:10 EDT Ventricular Rate:  77 PR Interval:    QRS Duration: 99 QT Interval:  412 QTC Calculation: 467 R Axis:   51 Text Interpretation:  Sinus rhythm Confirmed by Randal Buba, Elfego Giammarino ([id number]) on 01/17/2018 5:23:38 AM   Radiology No results found.  Procedures Procedures (including critical care time)  Medications Ordered in ED Medications  furosemide (LASIX) injection 40 mg (has no administration in time range)  albuterol (PROVENTIL) (2.5 MG/3ML) 0.083% nebulizer solution 10 mg (has no administration in time range)  sodium bicarbonate injection 50 mEq (has no administration in time range)  calcium gluconate 1 g in sodium chloride 0.9 % 100 mL IVPB (has no administration in time range)    MDM Reviewed: previous chart, nursing note and vitals Interpretation: labs, ECG and x-ray (hyperkalemia by me vascular congestion on cxr by me) Total time providing critical care: 75-105 minutes (treatment for  hyperkalemia). This excludes time spent performing separately reportable procedures and services. Consults: admitting MD  CRITICAL CARE Performed by: Carlisle Beers  Total critical care time: 75 minutes Critical care time was exclusive of separately billable procedures and treating other patients. Critical care was necessary to treat or prevent imminent or life-threatening deterioration. Critical care was time spent personally by me on the following activities: development of treatment plan with patient and/or surrogate as well as nursing, discussions with consultants, evaluation of patient's response to treatment, examination of patient, obtaining history from patient or surrogate, ordering and performing treatments and interventions, ordering and review of laboratory studies, ordering and review of radiographic studies, pulse oximetry and re-evaluation of patient's condition.  Final Clinical Impressions(s) / ED Diagnoses   Will admit to inpatient service      Rotonda, Karsyn Jamie, MD 01/17/18 [phone]

## 2018-01-17 NOTE — Progress Notes (Signed)
Pt was seen for gait and transfers, noted his impulsive habit of standing with no stablity of chair, and with a broken personal cane.  He needs a new quad cane, but should consider the use of the RW as well.  Recommend SNF as he is home alone and even so is likely to refuse.  Follow acutely for strengthening and balance training.      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  PMHx:  atherosclerosis, CAD, borderline DM, R knee sepsis, HTN  Precautions  Precautions Fall (telemetry)  Restrictions  Weight Bearing Restrictions No  Other Position/Activity Restrictions has L hip OA  Home Living  Family/patient expects to be discharged to: Private residence  Living Arrangements Alone  Available Help at Discharge Family;Available PRN/intermittently  Type of Home House  Home Layout One level  [employer] officer, [employer] - quad  Additional Comments cane is broken and not tall enough  Prior Function  Level of Independence Independent with assistive device(s)  Communication  Communication No difficulties  Pain Assessment  Pain Assessment Faces  Faces Pain Scale 6  Pain Location L hip  Pain Descriptors / Indicators Aching  Pain Intervention(s) Limited activity within patient's tolerance;Monitored during session;Repositioned  Cognition  Arousal/Alertness Awake/alert  Behavior During Therapy Impulsive  Overall Cognitive Status No family/caregiver present to determine baseline cognitive functioning  General Comments pt is not following instructions at all times  Upper Extremity Assessment  Upper Extremity Assessment Overall WFL for tasks assessed  Lower Extremity Assessment  Lower Extremity Assessment LLE deficits/detail  LLE Deficits / Details weakness L  hip and knee  LLE Coordination decreased fine motor;decreased gross motor  Cervical / Trunk Assessment  Cervical / Trunk Assessment Kyphotic  Bed Mobility  General bed mobility comments up in chair  Transfers  Overall transfer level Modified independent  Equipment used Rolling walker (2 wheeled);Quad cane;1 person hand held assist  General transfer comment pt is not attending to safety instructions, standing from unlocked chair  Ambulation/Gait  Ambulation/Gait assistance Min guard;Min assist  Gait Distance (Feet) 90 Feet  Assistive device Rolling walker (2 wheeled);Quad cane;1 person hand held assist  Gait Pattern/deviations Step-through pattern;Wide base of support;Decreased stride length;Decreased weight shift to left;Decreased stance time - left  Gait velocity reduced  Balance  Overall balance assessment Needs assistance  Sitting-balance support Feet supported  Sitting balance-Leahy Scale Good  Standing balance support Single extremity supported;During functional activity  Standing balance-Leahy Scale Fair  Standing balance comment pt must control speed of gait to avoid LOB  General Comments  General comments (skin integrity, edema, etc.) Pt is demonstrating poor control of the quad cane due to poor structure, requires a new cane for safer gait as his is too short and uneven on base  Exercises  Exercises Other exercises (L hip and knee 3+ to 4- strength)  PT - End of Session  Equipment Utilized During Treatment Gait belt  Activity Tolerance Patient tolerated treatment well;Patient limited by pain  Patient left in chair;with call bell/phone within reach;with family/visitor present  Nurse Communication Mobility status  PT Assessment  PT Recommendation/Assessment Patient needs continued PT services  PT Visit Diagnosis Unsteadiness on feet (R26.81);Muscle weakness (generalized) (M62.81);Pain  Pain - Right/Left Left  Pain - part  of body Hip  PT Problem List Decreased  strength;Decreased range of motion;Decreased activity tolerance;Decreased balance;Decreased mobility;Decreased coordination;Decreased knowledge of use of DME;Decreased safety awareness;Cardiopulmonary status limiting activity;Obesity;Pain  Barriers to Discharge Decreased caregiver support  Barriers to Discharge Comments home alone  PT Plan  PT Frequency (ACUTE ONLY) Min 2X/week  PT Treatment/Interventions (ACUTE ONLY) DME instruction;Gait training;Functional mobility training;Therapeutic activities;Therapeutic exercise;Balance training;Neuromuscular re-education;Patient/family education  AM-PAC PT "6 Clicks" Daily Activity Outcome Measure  Difficulty turning over in bed (including adjusting bedclothes, sheets and blankets)? 3  Difficulty moving from lying on back to sitting on the side of the bed?  3  Difficulty sitting down on and standing up from a chair with arms (e.g., wheelchair, bedside commode, etc,.)? 1  Help needed moving to and from a bed to chair (including a wheelchair)? 3  Help needed walking in hospital room? 3  Help needed climbing 3-5 steps with a railing?  2  6 Click Score 15  Mobility G Code  CK  PT Recommendation  Follow Up Recommendations SNF  PT equipment Cane (needs a properly fitted quad cane)  Individuals Consulted  Consulted and Agree with Results and Recommendations Patient  Acute Rehab PT Goals  Patient Stated Goal Pt hopes to go home and use his same equipment  PT Goal Formulation With patient  Time For Goal Achievement 01/31/18  Potential to Achieve Goals Good  PT Time Calculation  PT Start Time (ACUTE ONLY) 1552  PT Stop Time (ACUTE ONLY) 1615  PT Time Calculation (min) (ACUTE ONLY) 23 min  PT General Charges  $$ ACUTE PT VISIT 1 Visit  PT Evaluation  $PT Eval Moderate Complexity 1 Mod  PT Treatments  $Gait Training 8-22 mins  Written Expression  Dominant Hand Right    Mee Hives, PT MS Acute Rehab Dept. Number: [place]

## 2018-01-17 NOTE — H&P (Signed)
Date: 01/17/2018               Patient Name:  [name] MRN: [id number]  DOB: 09/24/1960 Age / Sex: 57 y.o., male   PCP: System, Provider Not In         Medical Service: Internal Medicine Teaching Service         Attending Physician: Dr. Evette Doffing, Mallie Mussel, *    First Contact: Dr. Sherry Ruffing Pager: [phone]  Second Contact: Dr. Hetty Ely Pager: [phone]       After Hours (After 5p/  First Contact Pager: [phone]  weekends / holidays): Second Contact Pager: [phone]   Chief Complaint: swelling  History of Present Illness:  [name] is a 57yo male with PMH of multiple myeloma, CKD stage 5, anemia of chronic disease, HTN, and T2DM presenting to [facility] for evaluation of swelling.  Patient states that yesterday he noticed he was not feeling his normal self and that he was experiencing bilateral LE swelling which prompted him to seek care as he was concerned his glucose was possibly out of control. Patient was started on his newest MM regimen of Daratumumad+Velcade+decadron in February 2019 and since then he reports a weigh gain of about 40lbs; he has been trying to control his fluid intake and diet however reports "going crazy" yesterday and eating several fried chicken meals, donuts, burgers, sodas, sweet tea to which he attributes his current symptoms. He reports similar episode about 3 weeks ago which he self treated with just drinking lots of water which helped relieve the swelling and allowed him to lose almost 10lbs before gaining it back.   Since February he has also endorsed dyspnea on exertion, which forces him to take a break every 32f of walking; he endorses an occasional dry cough but denies chest pain or orthopnea. He has had this evaluated in April 2019 with an EKG which did not show any abnormality, a CXR which showed stable mild cardiomegaly and vasc congestion, and an Echo which showed EF 60-65%, mildly abn LV diastolic function, mild LV hypertrophy and dilation, mod  LA dilation, mild RV dilation with normal function and no significant valvular findings.  He is supposed to be on furosemide per his nephrologist but has not been taking it; he feels that everything his nephrologist puts him on makes him ill and he wonders if she can't wait for him to progress to needing dialysis. [facility]  Furthermore, patient denies fevers, chills, headaches, vision or hearing changes, changes in urinary frequency or volume, frothy urine, hematuria, hematochezia, melena, N/V/D/C, abd pain, focal numbness or weakness, or bone pain other than chronic left hip pain.   Patient was initially diagnosed with MM in 2009 after seeking care for left hip pain and was found to have a lucent lesion adjacent to left innominate bone which prompted evaluation. At this time he also had renal failure and was on HD until 2012. 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 He was not a candidate for high dose chemo with stem cell transplantation due to high risk features of his MM and poor performance status. He initially had response to treatment (high dose decadron; 2 cycles of melphalan + prednisone; 18 cycles of velcade; Reblimid) however was found to have progression of disease on BMB in 04/2017 with new mutations found which is when he was started on his current regimen. He follows with Dr. VAdalberto Cole  Harish in [place].  In the ED he was found to be hypertensive to SBP 150s otherwise VS stable; Bmet revealed Na 136, K 6.1, Cl 107, Bicarb 18, BUN 45, Cr 5.95, Glu 125; CBC revealed WBC 9.1, Hgb 10.3, plt 155; BNP was 144, I stat top was negative. His EKG did not show acute ischemic changes but was thought to have had some increased peaking of T waves compared to prior. He was given calcium gluconate, IV lasix 43m once, and sodium bicarb. IMTS was asked to admit for further management of volume overload and hyperkalemia in patient with  CKD stage 5.  Meds:  Current Meds  Medication Sig  . acyclovir (ZOVIRAX) 400 MG tablet Take 400 mg by mouth daily.  .Marland KitchenamLODipine (NORVASC) 2.5 MG tablet Take 2.5 mg by mouth daily.   . B Complex-C-Folic Acid (NEPHRO-VITE PO) Take 1 tablet by mouth daily.   . calcitRIOL (ROCALTROL) 0.5 MCG capsule Take 0.5-1 mcg by mouth See admin instructions. Take 1 capsule Monday thru Friday then take 2 capsules on Saturday and Sunday  . cetirizine (ZYRTEC) 10 MG tablet Take 10 mg by mouth daily.  . Coenzyme Q10 (COQ10) 200 MG CAPS Take 1 capsule by mouth daily.  .Marland Kitchendexamethasone (DECADRON) 4 MG tablet Take 12 mg by mouth once a week. On Wednesday  . doxazosin (CARDURA) 4 MG tablet Take 4 mg by mouth at bedtime.  .Marland Kitchenescitalopram (LEXAPRO) 20 MG tablet Take 20 mg by mouth daily.  . magnesium oxide (MAG-OX) 400 MG tablet Take 1 tablet (400 mg total) by mouth 2 (two) times daily.  . metoprolol succinate (TOPROL-XL) 100 MG 24 hr tablet Take 100 mg by mouth daily. Take with or immediately following a meal.   . omeprazole (PRILOSEC) 20 MG capsule Take 20 mg by mouth daily.   . ondansetron (ZOFRAN ODT) 4 MG disintegrating tablet Take 1 tablet (4 mg total) by mouth every 8 (eight) hours as needed for nausea or vomiting.  . senna (SENOKOT) 8.6 MG tablet Take 1 tablet by mouth every evening.   . sodium bicarbonate 650 MG tablet Take 2 tablets (1,300 mg total) by mouth 4 (four) times daily. (Patient taking differently: Take 1,300 mg by mouth 2 (two) times daily. )    Allergies: Allergies as of 01/16/2018 - Review Complete 03/07/2017  Allergen Reaction Noted  . Tape Rash 01/09/2016   Past Medical History:  Diagnosis Date  . Arthritis   . AV (arteriovenous fistula) (HCC)    currently left upper arm  . 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 CKD (chronic kidney disease), stage V ([facility]rolog[facility] ([phone]   first dx 2009  --  hemodialysis 2009 until 2013 then  was being monitored  . GERD (gastroesophageal reflux disease)   . Gout    per pt stable as of 02-18-2016  . History of decubitus ulcer    2009 sacral-- resolved  . History of sepsis    01-09-2016 pyelonephrolitis   . History of small bowel obstruction    01-09-2016  partial sbo resolved without surgical intervention  . Hypertension   . Lesion of right native ureter   . Multiple myeloma ([facility] dx  01/ 2009 by dr [facility]eryle [facility]ams [facility]  dr vc  hCruzita Lederer  Stage IV IgG Kappa , involving bone extensively --  pallitive radiation to left hip and thoracic spine and oral chemo  .  Obstruction of right ureter   . Steroid-induced diabetes ([place])     Family History: Father who was heavy smoker had lung cancer; he has a brother who also has MM  Social History: Denies current or prior tobacco, EtOH, or illicit drug use  Review of Systems: A complete ROS was negative except as per HPI.   Physical Exam: Blood pressure (!) 159/76, pulse 79, temperature 99.6 F (37.6 C), temperature source Oral, resp. rate 16, height _0  (1.854 m), weight (!) 146.1 kg, SpO2 98 %. GENERAL- alert, co-operative, appears as stated age, not in any distress. HEENT- Atraumatic, normocephalic, EOMI, oral mucosa appears moist, poor dentition, +JVD  CARDIAC- RRR, 2/6 holosystolic murmur best heard at RUSB CHEST- port a cath on right anterior chest with small echymosis at site where tubing lays across skin but otherwise non tender, w/o erythema/drainage/induration RESP- Moving equal volumes of air, right basilar crackles; no wheezing or increased work of breathing appreciated ABDOMEN- Soft, distended, nontender, bowel sounds present. NEURO- No obvious Cr N abnormality; moves all extremities freely EXTREMITIES- LUE fistula, pulsatile but not great palpable thrill; pulse 2+ radial and PT; 2+ edema bilaterally to knees; L leg shorter than R SKIN- Warm, dry, no rash or lesion. PSYCH- Normal mood and affect,  appropriate thought content and speech.  EKG: personally reviewed my interpretation is sinus rhythm, TWI in III, otherwise no ST elevation or depression  CXR: personally reviewed my interpretation is cardiomegaly, increased vascular congestion compared to prior; no focal consolidations, effusions, or pneumothorax  Assessment & Plan by Problem: Active Problems:   Hyperkalemia  Hyperkalemia: Patient with CKD stage 5 with significant dietary indescretions found to have K of 6.1 on arrival to ED w/o associated symptoms or significant changes to EKG. He received calcium gluconate, IV lasix, albuterol, and sodium bicarb in the ED; repeat K 6.2. --telemetry --Kayexalate, insulin aspart 5 + D50, albuterol; will redose lasix in PM --f/u K q4hrs (next at 1pm) until stabilized  Volume overload CKD stage 5: Patient with chronic stable CKD stage 5, with labs on admission similar to range he has been in for the last few months. He endorses a 40lb weight gain since Feb 2019; started out at about 280lbs and heaviest weight was this week at 320lbs.On exam he has bil LE pitting edema to knees, +JVD and right basilar rales; volume overload likely 2/2 dietary indiscretion which is somewhat precipitated by chronic steroid use for his MM. His DOE per report is stable and he had w/u with TTE in April which did not find significant valvular dysfunction or evidence of prior ischemia. It is likely due to volume overload however if not improved with appropriate management of volume, it may have component inducible ischemia. --address hyper K as above --will assess urine output after IV lasix 64m and redose or increase dose based on this for the afternoon --renal diet with 1200cc FR --strict ins/outs, daily weights --continue home sodium bicarb, calcitriol  HTN: Patient with SBP of 150s on admission; per chart review he has had poor control of BP for some time. He endorses compliance with amlodipine 2.525mdaily and  metoprolol 10022maily. His volume overload is likely playing a part in poor control. --diurese as above --continue home amlodipine and metoprolol  T2DM: Patient not on medications; serum glucose 125 on admission. He will not be receiving any steroids while inpatient that might increase his glucose; will monitor with his renal function panel.  Multiple Myeloma: Patient follows with Dr. HarCruzita Lederer  [place]. Current regimen is Daratumumab (next dose in Sept), Velcade (next dose in 2 weeks), and weekly decadron which is now tapered down to 63m. The decadron is likely playing role in PO intake and volume overload over time, however if we diurese him adequately, continue conversation on salt and fluid intake restrictions, and send him out on stable diuretic regimen he should be able to remain stable while continuing his MM regimen.    Diet: Renal w/ FR IVF: none VTE ppx: heparin Code: Full  Dispo: Admit patient to Inpatient with expected length of stay greater than 2 midnights.  Signed: SAlphonzo Grieve MD 01/17/2018, 7:51 AM  Pager: GAlphonzo Grieve MD IMTS - PGY3 Pager [phone]

## 2018-01-17 NOTE — ED Notes (Addendum)
Pt refused xray 

## 2018-01-17 NOTE — ED Notes (Signed)
Pt adds that he refused the EKG since it was done in April and was more concerned about the swelling in his legs

## 2018-01-17 NOTE — Discharge Summary (Signed)
Name: [name] MRN: [id number] DOB: [birth year] 57 y.o. PCP: System, Provider Not In  Date of Admission: 01/16/2018 11:49 PM Date of Discharge: 01/19/2018 01/19/2018  4:19 PM Attending Physician: Aldine Contes, MD  Discharge Diagnosis: 1. Volume overload 2. Hyperkalemia  Discharge Medications: Allergies as of 01/19/2018      Reactions   Tape Rash   Paper tape only-- other tape is ok      Medication List    STOP taking these medications   HYDROcodone-acetaminophen 10-325 MG tablet Commonly known as:  NORCO     TAKE these medications   acyclovir 400 MG tablet Commonly known as:  ZOVIRAX Take 400 mg by mouth daily.   amLODipine 2.5 MG tablet Commonly known as:  NORVASC Take 2.5 mg by mouth daily.   calcitRIOL 0.5 MCG capsule Commonly known as:  ROCALTROL Take 0.5-1 mcg by mouth See admin instructions. Take 1 capsule Monday thru Friday then take 2 capsules on Saturday and Sunday   cetirizine 10 MG tablet Commonly known as:  ZYRTEC Take 10 mg by mouth daily.   CoQ10 200 MG Caps Take 1 capsule by mouth daily.   dexamethasone 4 MG tablet Commonly known as:  DECADRON Take 12 mg by mouth once a week. On Wednesday   doxazosin 4 MG tablet Commonly known as:  CARDURA Take 4 mg by mouth at bedtime.   escitalopram 20 MG tablet Commonly known as:  LEXAPRO Take 20 mg by mouth daily.   furosemide 20 MG tablet Commonly known as:  LASIX Take 3 tablets (60 mg total) by mouth daily.   magnesium oxide 400 MG tablet Commonly known as:  MAG-OX Take 1 tablet (400 mg total) by mouth 2 (two) times daily.   metoprolol succinate 100 MG 24 hr tablet Commonly known as:  TOPROL-XL Take 100 mg by mouth daily. Take with or immediately following a meal.   NEPHRO-VITE PO Take 1 tablet by mouth daily.   omeprazole 20 MG capsule Commonly known as:  PRILOSEC Take 20 mg by mouth daily.   ondansetron 4 MG disintegrating tablet Commonly known as:  ZOFRAN-ODT Take 1  tablet (4 mg total) by mouth every 8 (eight) hours as needed for nausea or vomiting.   phenazopyridine 200 MG tablet Commonly known as:  PYRIDIUM Take 1 tablet (200 mg total) by mouth 3 (three) times daily as needed for pain.   senna 8.6 MG tablet Commonly known as:  SENOKOT Take 1 tablet by mouth every evening.   sodium bicarbonate 650 MG tablet Take 2 tablets (1,300 mg total) by mouth 4 (four) times daily. What changed:  when to take this       Disposition and follow-up:   Mr.Keshawn W Thon was discharged from [facility] in Good condition.  At the hospital follow up visit please address:  1.  Patient was discharged with lasix PO, please assess his weight and output, please also assess his kidney function and K.    2.  Labs / imaging needed at time of follow-up: BMP  3.  Pending labs/ test needing follow-up: None  Follow-up Appointments: Follow-up Information    Dr. Lenward Chancellor. Schedule an appointment as soon as possible for a visit in 1 week(s).           Hospital Course by problem list: 1. Volume overload: Patient presented with lower extremity swelling, chronic dyspnea on exertion. On exam he was found to have +JVD, 2+ bilateral pitting edema, and basilar rales.  He had a weight gain of 40 lbs since february 2019 due to starting a new regimen with decadron for his multiple myeloma. He was supposed to be on lasix at home but had not been using it. He reports that he went out to eat the day prior to admission, and went to about 5 different fast food restaurants. He was started on IV lasix and had good output and weight loss with this. Total net output was about 4L, weight loss from 322 to 314.5. He reports that he had been feeling better, lower extremity swelling had decreased, and had started walking around more. He was discharged on 60 mg lasix daily.   2. Hyperkalemia: Patient presented with a K of 6.1 and abnormal T waves on EKG, he was given calcium  gluconase, lasix, albuterol and sodium bicarb while in the ED, repeat K was 6.2. He wa sthen given kayexalate, insulin aspart 5 +D50, K corrected to WNL. Last K was 4.8.     Discharge Vitals:   BP (!) 149/77 (BP Location: Right Arm)   Pulse 82   Temp 98.9 F (37.2 C) (Oral)   Resp 19   Ht _0  (1.854 m)   Wt (!) 142.7 kg   SpO2 97%   BMI 41.49 kg/m   Pertinent Labs, Studies, and Procedures:  BMP Latest Ref Rng & Units 01/18/2018 01/17/2018 01/17/2018  Glucose 70 - 99 mg/dL 93 - -  BUN 6 - 20 mg/dL 48(H) - -  Creatinine 0.61 - 1.24 mg/dL 5.81(H) - -  Sodium 135 - 145 mmol/L 140 - -  Potassium 3.5 - 5.1 mmol/L 4.8 4.6 4.7  Chloride 98 - 111 mmol/L 107 - -  CO2 22 - 32 mmol/L 24 - -  Calcium 8.9 - 10.3 mg/dL 7.3(L) - -     Discharge Instructions: Discharge Instructions    Call MD for:  difficulty breathing, headache or visual disturbances   Complete by:  As directed    Call MD for:  extreme fatigue   Complete by:  As directed    Call MD for:  hives   Complete by:  As directed    Call MD for:  persistant dizziness or light-headedness   Complete by:  As directed    Call MD for:  persistant nausea and vomiting   Complete by:  As directed    Call MD for:  redness, tenderness, or signs of infection (pain, swelling, redness, odor or green/yellow discharge around incision site)   Complete by:  As directed    Call MD for:  severe uncontrolled pain   Complete by:  As directed    Call MD for:  temperature >100.4   Complete by:  As directed    Diet - low sodium heart healthy   Complete by:  As directed    Increase activity slowly   Complete by:  As directed       Signed: Asencion Noble, MD 01/21/2018, 5:44 PM   Pager: [phone]

## 2018-01-18 DIAGNOSIS — I120 Hypertensive chronic kidney disease with stage 5 chronic kidney disease or end stage renal disease: Secondary | ICD-10-CM

## 2018-01-18 DIAGNOSIS — Z992 Dependence on renal dialysis: Secondary | ICD-10-CM

## 2018-01-18 LAB — RENAL FUNCTION PANEL
Albumin: 3.1 g/dL — ABNORMAL LOW (ref 3.5–5.0)
Anion gap: 9 (ref 5–15)
BUN: 48 mg/dL — AB (ref 6–20)
CHLORIDE: 107 mmol/L (ref 98–111)
CO2: 24 mmol/L (ref 22–32)
Calcium: 7.3 mg/dL — ABNORMAL LOW (ref 8.9–10.3)
Creatinine, Ser: 5.81 mg/dL — ABNORMAL HIGH (ref 0.61–1.24)
GFR calc Af Amer: 11 mL/min — ABNORMAL LOW (ref >60)
GFR calc non Af Amer: 10 mL/min — ABNORMAL LOW (ref >60)
GLUCOSE: 93 mg/dL (ref 70–99)
POTASSIUM: 4.8 mmol/L (ref 3.5–5.1)
Phosphorus: 6.6 mg/dL — ABNORMAL HIGH (ref 2.5–4.6)
Sodium: 140 mmol/L (ref 135–145)

## 2018-01-18 LAB — GLUCOSE, CAPILLARY: Glucose-Capillary: 109 mg/dL — ABNORMAL HIGH (ref 70–99)

## 2018-01-18 LAB — HIV ANTIBODY (ROUTINE TESTING W REFLEX): HIV Screen 4th Generation wRfx: NONREACTIVE

## 2018-01-18 MED ORDER — SENNA 8.6 MG PO TABS: 2.0000 | ORAL_TABLET | Freq: Every day | ORAL | Status: DC | PRN

## 2018-01-18 MED ORDER — FUROSEMIDE 10 MG/ML IJ SOLN
60.0000 mg | Freq: Once | INTRAMUSCULAR | Status: AC
  Administered 2018-01-18: 60 mg via INTRAVENOUS
  Filled 2018-01-18: qty 6

## 2018-01-18 MED ORDER — POLYETHYLENE GLYCOL 3350 17 G PO PACK: 17.0000 g | PACK | Freq: Every day | ORAL | Status: DC | PRN

## 2018-01-18 NOTE — Progress Notes (Addendum)
Subjective: Patient was doing well today, no acute events overnight. He reported that there is less pain in his legs compared to when he came in. He reported that he feels like he wants to eat a lot of food due to the steroids. He reported no change in his shortness of breath but states that that is chronic. He reports that this has happened before but that it improved after just drinking water. He was concerned about his creatinine because he does not want to go back on dialysis. He reports that he was on dialysis about 7 years ago and that he has not needed it since then. We discussed the plan with him and he is in agreement.  Objective:  Vital signs in last 24 hours: Vitals:   01/17/18 2233 01/17/18 2313 01/18/18 0100 01/18/18 0517  BP: (!) 159/84   (!) 153/85  Pulse: 79   78  Resp:      Temp: 98.7 F (37.1 C)   98.2 F (36.8 C)  TempSrc: Oral   Oral  SpO2: 100%   98%  Weight:  (!) 147.7 kg (!) 146.8 kg   Height:        General: Well nourished, well appearing, NAD  Cardiac: RRR, normal S1, S2, no murmurs, rubs or gallops  Pulmonary: Lungs CTA bilaterally, no wheezing, rhonchi or rales  Abdomen: Soft, obese, non-tender, +bowel sounds, no guarding or masses noted  Extremity: 2+ bilateral LE edema, patent fistula in left upper extremity that is patent with a thrill Neuro: Alert and oriented x3, moves all extremities Psychiatry: Normal mood and affect   Assessment/Plan: This is a 57 year old male with a PMH og multiple myeloma, CKD stage 5, anemia of chronic disease, HTN, type 2 DM who presented with bilateral LE swelling. He was found to be volume overloaded and hyperkalemic.   Hyperkalemia: History of CKD stage 5, presented with a K of 6.1 and abnormal T waves on EKG. He was given calcium gluconate, IV lasix, albuterol, and sodium bicarb while in the ED mand a repeat K was 6.2. He was given kayexalate, insulin aspart 5 + D50 last night. K corrected to 4.7 and 4.6 after that. 8/23 K  is 4.8.   Volume overload CKD stage 5. Presented with bilateral LE edema to knees, +JVD, and right basilar rales, had a 40lb weight gain since Feb 2019, notably when he started a new MM regimen that included decadron. He reports chronic SOB that has not worsened. He is supposed to be on lasix at home but has not been using it. Patient has been having good output and weight loss today. He states that there is less pain in his legs.  -S/p 121m IV lasix yesterday -Output of 2950, Input of 460 -Weight increased from 146.1kg to 146.8 -Strict I+Os -Daily weights -Lasix 666mIV today -May be able to go home today on PO lasix and follow up outpatient.  Hypertension: On amlodipine 2.55m86mnd metoprolol 100m31m home. On admission BP was 171/89.  -Continue home medications -SBP around 150s today  Diabetes mellitus type 2: Not on any medications at home. Will continue to monitor glucose on BMPs.   Multiple Myeloma: Is on decadron 3mg 10mry 2 weeks, will need it next week. On daratumumab and velcade. [facility] Fluid restriction, replete lytes prn, renal diet VTE ppx: Heparin Code Status: FULL    Dispo: Anticipated discharge in approximately 0-1 day(s).   KrienSherry Ruffing  Adela Lank, MD 01/18/2018, 6:21 AM Pager: [phone]

## 2018-01-18 NOTE — Progress Notes (Signed)
Occupational Therapy Evaluation Patient Details Name: [name] MRN: [id number] DOB: 01/22/1961 Today's Date: 01/18/2018    History of Present Illness 57 yo male with onset of R basal ganglia stroke and L LE hemiparesis was admitted, noted chest tightness and SOB initially as well as EF 49%.  PMHx:  atherosclerosis, CAD, borderline DM, R knee sepsis, HTN   Clinical Impression   PTA, pt lived alone and was modified independent with ADL and mobility. Pt currently modified independent with giat in room and during ADL. Pt is impulsive, which is most likely his baseline behavior. Attempted to educate pt on energy conservation and lifestyle choices to reduce risks of readmission. No further OT care OT needed. OT signing off.     Follow Up Recommendations  No OT follow up;Supervision - Intermittent    Equipment Recommendations  Tub/shower seat(pt declines)    Recommendations for Other Services       Precautions / Restrictions Precautions Precautions: Fall(telemetry) Restrictions Weight Bearing Restrictions: No Other Position/Activity Restrictions: has L hip OA      Mobility Bed Mobility Overal bed mobility: Modified Independent                Transfers Overall transfer level: Modified independent Equipment used: Quad cane                  Balance Overall balance assessment: Needs assistance Sitting-balance support: Feet supported Sitting balance-Leahy Scale: Good     Standing balance support: Single extremity supported;During functional activity Standing balance-Leahy Scale: Fair                             ADL either performed or assessed with clinical judgement   ADL Overall ADL's : At baseline                                             Vision         Perception     Praxis      Pertinent Vitals/Pain Pain Assessment: Faces Faces Pain Scale: Hurts a little bit Pain Location: L hip Pain Descriptors /  Indicators: Aching Pain Intervention(s): Limited activity within patient's tolerance     Hand Dominance Right   Extremity/Trunk Assessment Upper Extremity Assessment Upper Extremity Assessment: Overall WFL for tasks assessed   Lower Extremity Assessment Lower Extremity Assessment: Defer to PT evaluation LLE Deficits / Details: weakness L hip and knee LLE Coordination: decreased fine motor;decreased gross motor   Cervical / Trunk Assessment Cervical / Trunk Assessment: Kyphotic   Communication Communication Communication: No difficulties   Cognition Arousal/Alertness: Awake/alert Behavior During Therapy: Impulsive Overall Cognitive Status: Within Functional Limits for tasks assessed    Pt impulsive but able to complete tasks safely. Cousin states that "this is how he normally is"                             General Comments: cousin present and states heis at his cognitive baeline   General Comments       Exercises Exercises: (L hip and knee 3+ to 4- strength)   Shoulder Instructions      Home Living Family/patient expects to be discharged to:: Private residence Living Arrangements: Alone Available Help at Discharge: Family;Available PRN/intermittently Type of Home: House Home Access: Stairs  to enter Entrance Stairs-Number of Steps: 3   Home Layout: One level     Bathroom Shower/Tub: Tub/shower unit;Curtain   Biochemist, clinical: Standard Bathroom Accessibility: No   Home Equipment: Cane - quad   Additional Comments: cane is broken and not tall enough but he likes it that way      Prior Functioning/Environment Level of Independence: Independent with assistive device(s)                 OT Problem List: Decreased activity tolerance;Decreased knowledge of use of DME or AE;Obesity;Increased edema      OT Treatment/Interventions:      OT Goals(Current goals can be found in the care plan section) Acute Rehab OT Goals Patient Stated Goal: to go  home and not eat fried chicken OT Goal Formulation: All assessment and education complete, DC therapy  OT Frequency:     Barriers to D/C:            Co-evaluation              AM-PAC PT "6 Clicks" Daily Activity     Outcome Measure Help from another person eating meals?: None Help from another person taking care of personal grooming?: None Help from another person toileting, which includes using toliet, bedpan, or urinal?: None Help from another person bathing (including washing, rinsing, drying)?: None Help from another person to put on and taking off regular upper body clothing?: None Help from another person to put on and taking off regular lower body clothing?: None 6 Click Score: 24   End of Session Equipment Utilized During Treatment: Gait belt Nurse Communication: Mobility status  Activity Tolerance: Patient tolerated treatment well Patient left: in bed;with call bell/phone within reach;with family/visitor present  OT Visit Diagnosis: Other (comment)(edema; decreased activity tolerance)                Time: 1771-1657 OT Time Calculation (min): 16 min Charges:  OT General Charges $OT Visit: 1 Visit OT Evaluation $OT Eval Low Complexity: Effort, OT/L  OT Clinical Specialist [phone]   [facility] 01/18/2018, 1:05 PM

## 2018-01-19 MED ORDER — FUROSEMIDE 20 MG PO TABS: 60.0000 mg | ORAL_TABLET | Freq: Every day | ORAL | 3 refills | Status: AC

## 2018-01-19 MED ORDER — HEPARIN SOD (PORK) LOCK FLUSH 100 UNIT/ML IV SOLN
500.0000 [IU] | INTRAVENOUS | Status: AC | PRN
  Administered 2018-01-19: 500 [IU]

## 2018-01-19 NOTE — Progress Notes (Signed)
Patient discharged to home. After visit Summary reviewed. Patient capable of reverbalizing medications and follow up visits. No signs and symptoms of distress noted. Patient educated to return to the ED in the case of an emergency. Kelliann Pendergraph RN 

## 2018-01-19 NOTE — Clinical Social Work Note (Signed)
Per MD note pt has improved and plans to d/c home. Clinical Social Worker will sign off for now as social work intervention is no longer needed. Please consult [place] again if new need arises.   [name] 01/19/2018

## 2018-01-19 NOTE — Progress Notes (Signed)
Physical Therapy Treatment Patient Details Name: [name] MRN: [id number] DOB: 06/28/1960 Today's Date: 01/19/2018    History of Present Illness Pt is a 57 y/o male with PMH of multiple myeloma, CKD stage 5, anemia of chronic disease, HTN, and T2DM presenting to [facility] for evaluation of swelling with volume overload.    PT Comments    Pt with greatly improved functional mobility. He tolerated ambulation in hallway with min guard and stair training this session as well. Pt limited secondary to fatigue and required standing rest breaks x2 during activity. Recommendations have been updated to reflect pt's improvement. Pt would continue to benefit from skilled physical therapy services at this time while admitted and after d/c to address the below listed limitations in order to improve overall safety and independence with functional mobility.   Follow Up Recommendations  Home health PT     Equipment Recommendations  None recommended by PT;Other (comment)(pt refusing any DME at this time)    Recommendations for Other Services       Precautions / Restrictions Precautions Precautions: Fall Restrictions Weight Bearing Restrictions: No    Mobility  Bed Mobility Overal bed mobility: Modified Independent                Transfers Overall transfer level: Modified independent                  Ambulation/Gait Ambulation/Gait assistance: Min guard Gait Distance (Feet): 150 Feet Assistive device: None;1 person hand held assist Gait Pattern/deviations: Step-through pattern;Decreased step length - right;Decreased step length - left;Decreased stride length;Trendelenburg Gait velocity: decreased Gait velocity interpretation: 1.31 - 2.62 ft/sec, indicative of limited community ambulator General Gait Details: pt with modest instability but no overt LOB or need for physical assistance; instability more related to his leg length discrepency and pt not currently wearing his  shoes with the lift in them    Stairs Stairs: Yes Stairs assistance: Min guard Stair Management: Two rails;Step to pattern;Forwards Number of Stairs: 2 General stair comments: min guard for safety; pt using bilateral hand rails for support   Wheelchair Mobility    Modified Rankin (Stroke Patients Only)       Balance Overall balance assessment: Needs assistance Sitting-balance support: Feet supported Sitting balance-Leahy Scale: Good     Standing balance support: No upper extremity supported Standing balance-Leahy Scale: Fair                              Cognition Arousal/Alertness: Awake/alert Behavior During Therapy: WFL for tasks assessed/performed Overall Cognitive Status: Within Functional Limits for tasks assessed                                        Exercises      General Comments        Pertinent Vitals/Pain Pain Assessment: No/denies pain    Home Living                      Prior Function            PT Goals (current goals can now be found in the care plan section) Acute Rehab PT Goals PT Goal Formulation: With patient Time For Goal Achievement: 01/31/18 Potential to Achieve Goals: Good Progress towards PT goals: Progressing toward goals    Frequency    Min 3X/week  PT Plan Discharge plan needs to be updated;Frequency needs to be updated    Co-evaluation              AM-PAC PT "6 Clicks" Daily Activity  Outcome Measure  Difficulty turning over in bed (including adjusting bedclothes, sheets and blankets)?: None Difficulty moving from lying on back to sitting on the side of the bed? : None Difficulty sitting down on and standing up from a chair with arms (e.g., wheelchair, bedside commode, etc,.)?: A Little Help needed moving to and from a bed to chair (including a wheelchair)?: None Help needed walking in hospital room?: None Help needed climbing 3-5 steps with a railing? : A  Little 6 Click Score: 22    End of Session   Activity Tolerance: Patient tolerated treatment well Patient left: in bed;with call bell/phone within reach;Other (comment)(sitting EOB) Nurse Communication: Mobility status PT Visit Diagnosis: Other abnormalities of gait and mobility (R26.89)     Time: 5929-2446 PT Time Calculation (min) (ACUTE ONLY): 19 min  Charges:  $Gait Training: 8-22 mins                     [place], [place], [place] 01/19/2018, 11:40 AM

## 2018-01-19 NOTE — Progress Notes (Addendum)
   Subjective: Patient was feeling tired today, no acute events overnight. He reports that he thinks the lower extremity swelling had improved. He feels better and reports that he's ready to go home. He has been walking up and down the halls with no issues, using his cane. We discussed the plan with him and he is in agreement. We talked about him following up with a primary care provider or his nephrologist to get his labs checked and examined.   Objective:  Vital signs in last 24 hours: Vitals:   01/18/18 0955 01/18/18 1733 01/18/18 2217 01/19/18 0442  BP: (!) 148/77 (!) 147/81 (!) 145/77 (!) 146/78  Pulse: 82 74 75 71  Resp: '20 18 18 16  '$ Temp: 98.8 F (37.1 C) 98.4 F (36.9 C) 98.9 F (37.2 C) 98.1 F (36.7 C)  TempSrc: Oral Oral Oral Oral  SpO2: 97% 98% 97% 96%  Weight:   (!) 142.7 kg   Height:        General: Well nourished, well appearing, NAD  Cardiac: RRR, normal S1, S2, no murmurs, rubs or gallops  Pulmonary: Lungs CTA bilaterally, no wheezing, rhonchi or rales  Abdomen: Soft, non-tender, +bowel sounds Extremity: 2+ LE edema, no wounds Neuro: Alert and oriented x3, PERRLA, moves all extremities Psychiatry: Normal mood and affect     Assessment/Plan: This is a 57 year old male with a PMH og multiple myeloma, CKD stage 5, anemia of chronic disease, HTN, type 2 DM who presented with bilateral LE swelling. He was found to be volume overloaded and hyperkalemic.   Volume overload CKD stage 5. Presented with bilateral LE edema to knees, +JVD, and right basilar rales, had a 40lb weight gain since Feb 2019, notably when he started a new MM regimen that included decadron. He reports chronic SOB that has not worsened. He is supposed to be on lasix at home but has not been using it. Patient continues to have good output and weight loss. This was likely 2/2 to dietary indiscretion. He reports that he has been feeling better.   -S/p '60mg'$  IV lasix yesterday -Net output of  3,300 -Weight 142.7 from 146.8 -Discharge today on PO lasix and follow up with his PCP and nephrologist.  Hypertension: On amlodipine 2.'5mg'$  and metoprolol '100mg'$  at home. On admission BP was 171/89.  -Continue home medications -SBP around 140  Diabetes mellitus type 2: Not on any medications at home. Will continue to monitor glucose on BMPs.   Hyperkalemia (resolved): History of CKD stage 5, presented with a K of 6.1 and abnormal T waves on EKG. He was given calcium gluconate, IV lasix, albuterol, and sodium bicarb while in the ED mand a repeat K was 6.2. He was given kayexalate, insulin aspart 5 + D50 last night. K corrected to 4.7 and 4.6 after that. 8/23 K is 4.8.   Multiple Myeloma: Is on decadron '3mg'$  every 2 weeks, will need it next week. On daratumumab and velcade. Followed by Dr. Cruzita Lederer in [facility]   FEN: No fluids, replete lytes prn, renal diet  VTE ppx: Heparin drip Code Status: FULL   Dispo: Anticipated discharge in approximately 0-1 day(s).   Asencion Noble, MD 01/19/2018, 6:11 AM Pager: [phone]

## 2018-01-19 NOTE — Discharge Instructions (Signed)
Keene Breath,   It has been a pleasure working with you and we are glad you're feeling better. You were hospitalized for volume overload and high potassium.   For your volume overload,  START taking Lasix  60 mg once a day, the prescription has been sent to [facility] in [place] yourself and monitor your diet and fluid intake Follow up with a primary care provider in 1 week to get labs checked, if you are unable to do that please follow up with your nephrologist to get the labs checked.  If your symptoms worsen or you develop new symptoms, please seek medical help whether it is your primary care provider or emergency department.  If you have any questions about this hospitalization please call [phone].

## 2018-02-18 ENCOUNTER — Emergency Department (HOSPITAL_COMMUNITY)
Admission: EM | Admit: 2018-02-18 | Discharge: 2018-02-19 | Disposition: A | Discharge status: Home / Self Care | Payer: Medicaid Other | Attending: Emergency Medicine | Admitting: Emergency Medicine

## 2018-02-18 DIAGNOSIS — E119 Type 2 diabetes mellitus without complications: Secondary | ICD-10-CM | POA: Insufficient documentation

## 2018-02-18 DIAGNOSIS — R1012 Left upper quadrant pain: Secondary | ICD-10-CM | POA: Diagnosis present

## 2018-02-18 DIAGNOSIS — Z79899 Other long term (current) drug therapy: Secondary | ICD-10-CM | POA: Insufficient documentation

## 2018-02-18 DIAGNOSIS — R109 Unspecified abdominal pain: Secondary | ICD-10-CM

## 2018-02-18 DIAGNOSIS — R9389 Abnormal findings on diagnostic imaging of other specified body structures: Secondary | ICD-10-CM

## 2018-02-18 DIAGNOSIS — I120 Hypertensive chronic kidney disease with stage 5 chronic kidney disease or end stage renal disease: Secondary | ICD-10-CM | POA: Diagnosis not present

## 2018-02-18 DIAGNOSIS — N185 Chronic kidney disease, stage 5: Secondary | ICD-10-CM | POA: Insufficient documentation

## 2018-02-18 DIAGNOSIS — R110 Nausea: Secondary | ICD-10-CM

## 2018-02-18 DIAGNOSIS — R7989 Other specified abnormal findings of blood chemistry: Secondary | ICD-10-CM

## 2018-02-19 ENCOUNTER — Encounter (HOSPITAL_COMMUNITY): Payer: Self-pay

## 2018-02-19 ENCOUNTER — Emergency Department (HOSPITAL_COMMUNITY): Payer: Medicaid Other

## 2018-02-19 ENCOUNTER — Other Ambulatory Visit: Payer: Self-pay

## 2018-02-19 LAB — CBC
HCT: 29 % — ABNORMAL LOW (ref 39.0–52.0)
HEMOGLOBIN: 9.4 g/dL — AB (ref 13.0–17.0)
MCH: 32 pg (ref 26.0–34.0)
MCHC: 32.4 g/dL (ref 30.0–36.0)
MCV: 98.6 fL (ref 78.0–100.0)
Platelets: 169 10*3/uL (ref 150–400)
RBC: 2.94 MIL/uL — ABNORMAL LOW (ref 4.22–5.81)
RDW: 16.4 % — AB (ref 11.5–15.5)
WBC: 8.1 10*3/uL (ref 4.0–10.5)

## 2018-02-19 LAB — URINALYSIS, ROUTINE W REFLEX MICROSCOPIC
BILIRUBIN URINE: NEGATIVE
Bacteria, UA: NONE SEEN
Glucose, UA: NEGATIVE mg/dL
HGB URINE DIPSTICK: NEGATIVE
Ketones, ur: NEGATIVE mg/dL
Nitrite: NEGATIVE
PH: 6 (ref 5.0–8.0)
Protein, ur: 100 mg/dL — AB
Specific Gravity, Urine: 1.01 (ref 1.005–1.030)

## 2018-02-19 LAB — COMPREHENSIVE METABOLIC PANEL
ALBUMIN: 3.4 g/dL — AB (ref 3.5–5.0)
ALT: 13 U/L (ref 0–44)
ANION GAP: 11 (ref 5–15)
AST: 12 U/L — AB (ref 15–41)
Alkaline Phosphatase: 63 U/L (ref 38–126)
BUN: 58 mg/dL — AB (ref 6–20)
CALCIUM: 7.6 mg/dL — AB (ref 8.9–10.3)
CO2: 21 mmol/L — AB (ref 22–32)
CREATININE: 6.19 mg/dL — AB (ref 0.61–1.24)
Chloride: 111 mmol/L (ref 98–111)
GFR calc Af Amer: 10 mL/min — ABNORMAL LOW (ref >60)
GFR calc non Af Amer: 9 mL/min — ABNORMAL LOW (ref >60)
GLUCOSE: 104 mg/dL — AB (ref 70–99)
Potassium: 5.2 mmol/L — ABNORMAL HIGH (ref 3.5–5.1)
SODIUM: 143 mmol/L (ref 135–145)
Total Bilirubin: 0.6 mg/dL (ref 0.3–1.2)
Total Protein: 6 g/dL — ABNORMAL LOW (ref 6.5–8.1)

## 2018-02-19 LAB — LIPASE, BLOOD: Lipase: 51 U/L (ref 11–51)

## 2018-02-19 MED ORDER — OMEPRAZOLE 20 MG PO CPDR: 20.0000 mg | DELAYED_RELEASE_CAPSULE | Freq: Every day | ORAL | 0 refills | Status: DC

## 2018-02-19 MED ORDER — ONDANSETRON HCL 4 MG PO TABS: 4.0000 mg | ORAL_TABLET | Freq: Three times a day (TID) | ORAL | 0 refills | Status: DC | PRN

## 2018-02-19 MED ORDER — IOPAMIDOL (ISOVUE-300) INJECTION 61%
INTRAVENOUS | Status: AC
  Filled 2018-02-19: qty 30

## 2018-02-19 MED ORDER — ONDANSETRON HCL 4 MG/2ML IJ SOLN
4.0000 mg | Freq: Once | INTRAMUSCULAR | Status: AC
  Administered 2018-02-19: 4 mg via INTRAVENOUS
  Filled 2018-02-19: qty 2

## 2018-02-19 NOTE — ED Notes (Signed)
Patient continues to have abd.. Pains associated with nausea. Abd obese and tender to touch at the umbilical area.

## 2018-02-19 NOTE — ED Provider Notes (Signed)
[facility] EMERGENCY DEPARTMENT Provider Note   CSN: [id number] Arrival date & time: 02/18/18  2319     History   Chief Complaint Chief Complaint  Patient presents with  . Nausea  . Abdominal Pain    HPI [name] is a 57 y.o. male.  HPI Patient presents to the emergency department with abdominal discomfort with nausea since last night.  The patient states that he was concerned that it could be related to his flu shot that he got last Wednesday.  Patient states he did not take any medications at home for his symptoms.  Patient states that he has not had any diarrhea.  The patient denies chest pain, shortness of breath, headache,blurred vision, neck pain, fever, cough, weakness, numbness, dizziness, anorexia, edema,  diarrhea, rash, back pain, dysuria, hematemesis, bloody stool, near syncope, or syncope. Past Medical History:  Diagnosis Date  . Arthritis   . AV (arteriovenous fistula) (HCC)    currently left upper arm  . 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 CKD (chronic kidney disease), stage V [facility] nephrologist-  dr Cay Schillings in [facility]  [phone])   first dx 2009  --  hemodialysis 2009 until 2013 then was being monitored  . GERD (gastroesophageal reflux disease)   . Gout    per pt stable as of 02-18-2016  . History of decubitus ulcer    2009 sacral-- resolved  . History of sepsis    01-09-2016 pyelonephrolitis   . History of small bowel obstruction    01-09-2016  partial sbo resolved without surgical intervention  . Hypertension   . Lesion of right native ureter   . Multiple myeloma [facility]) dx  01/ 2009 by dr Beryle Beams  current oncologist in [facility],  dr vc  Cruzita Lederer   Stage IV IgG Kappa , involving bone extensively --  pallitive radiation to left hip and thoracic spine and oral chemo  . Obstruction of right ureter   . Steroid-induced diabetes [facility])     Patient Active Problem List   Diagnosis Date Noted  . Volume  overload 01/17/2018  . CKD (chronic kidney disease), stage V ([place]) 01/09/2016  . Hyperkalemia 01/09/2016  . Multiple myeloma ([place])   . Hypertension   . Diabetes mellitus without complication [facility])     Past Surgical History:  Procedure Laterality Date  . BONE MARROW BIOPSY  04/2018  . CYSTOSCOPY WITH STENT PLACEMENT Right 01/16/2016   Procedure: CYSTOSCOPY WITH STENT PLACEMENT;  Surgeon: Raynelle Bring, MD;  Location: [facility];  Service: Urology;  Laterality: Right;  . CYSTOSCOPY WITH URETEROSCOPY AND STENT PLACEMENT Right 02/21/2016   Procedure: CYSTOSCOPY WITH RIGHT URETEROSCOPY, RIGHT RGP, RIGHT URETERAL BIOPSY  AND STENT PLACEMENT;  Surgeon: Kathie Rhodes, MD;  Location: [facility];  Service: Urology;  Laterality: Right;  . DIALYSIS FISTULA CREATION  last one 07-10-2007  dr Scot Dock   left upper extremity  . KNEE ARTHROSCOPY Left 1999  . LAPAROSCOPIC CHOLECYSTECTOMY  12-31-2015   [facility]  . PORTA CATH INSERTION  06/2017  . TRANSTHORACIC ECHOCARDIOGRAM  05/29/2007   mild LVH, ef 55%, abnormal septal motion/  mild AV calcification without stenosis/  trivial MR/  mild LAE/  mild TR        Home Medications    Prior to Admission medications   Medication Sig Start Date End Date Taking? Authorizing Provider  acyclovir (ZOVIRAX) 400 MG tablet Take 400 mg by mouth daily.  Yes [provider]  amLODipine (NORVASC) 2.5 MG tablet Take 2.5 mg by mouth daily.  05/11/15  Yes [provider]  B Complex-C-Folic Acid (NEPHRO-VITE PO) Take 1 tablet by mouth daily.    Yes [provider]  calcitRIOL (ROCALTROL) 0.5 MCG capsule Take 0.5-1 mcg by mouth See admin instructions. Take 1 capsule Monday thru Friday then take 2 capsules on Saturday and Sunday   Yes [provider]  cetirizine (ZYRTEC) 10 MG tablet Take 10 mg by mouth daily.   Yes [provider]  Coenzyme Q10 (COQ10) 200 MG CAPS Take 1 capsule by mouth daily.   Yes  [provider]  dexamethasone (DECADRON) 4 MG tablet Take 12 mg by mouth once a week. On Wednesday   Yes [provider]  doxazosin (CARDURA) 4 MG tablet Take 4 mg by mouth at bedtime.   Yes [provider]  escitalopram (LEXAPRO) 20 MG tablet Take 20 mg by mouth daily.   Yes [provider]  furosemide (LASIX) 20 MG tablet Take 3 tablets (60 mg total) by mouth daily. 01/19/18 05/19/18 Yes Asencion Noble, MD  magnesium oxide (MAG-OX) 400 MG tablet Take 1 tablet (400 mg total) by mouth 2 (two) times daily. 11/12/15  Yes Pisciotta, Elmyra Ricks, PA-C  metoprolol succinate (TOPROL-XL) 100 MG 24 hr tablet Take 100 mg by mouth daily. Take with or immediately following a meal.    Yes [provider]  omeprazole (PRILOSEC) 20 MG capsule Take 20 mg by mouth daily.    Yes [provider]  senna (SENOKOT) 8.6 MG tablet Take 1 tablet by mouth every evening.    Yes [provider]  sodium bicarbonate 650 MG tablet Take 2 tablets (1,300 mg total) by mouth 4 (four) times daily. Patient taking differently: Take 1,300 mg by mouth 2 (two) times daily.  11/12/15  Yes Pisciotta, Elmyra Ricks, PA-C  ondansetron (ZOFRAN ODT) 4 MG disintegrating tablet Take 1 tablet (4 mg total) by mouth every 8 (eight) hours as needed for nausea or vomiting. Patient not taking: Reported on 02/19/2018 11/12/15   Pisciotta, Elmyra Ricks, PA-C  phenazopyridine (PYRIDIUM) 200 MG tablet Take 1 tablet (200 mg total) by mouth 3 (three) times daily as needed for pain. Patient not taking: Reported on 01/17/2018 02/21/16   Kathie Rhodes, MD    Family History Family History  Problem Relation Age of Onset  . Hypertension Mother   . Hypertension Father   . Lung cancer Father   . Multiple myeloma Brother   . Cervical cancer Sister     Social History Social History   Tobacco Use  . Smoking status: Never Smoker  . Smokeless tobacco: Never Used  Substance Use Topics  . Alcohol use: No  . Drug  use: No     Allergies   Tape   Review of Systems Review of Systems  All other systems negative except as documented in the HPI. All pertinent positives and negatives as reviewed in the HPI. Physical Exam Updated Vital Signs BP (!) 176/78   Pulse 74   Temp 98 F (36.7 C)   Resp 18   Ht _0  (1.854 m)   Wt (!) 142.4 kg   SpO2 96%   BMI 41.43 kg/m   Physical Exam  Constitutional: He is oriented to person, place, and time. He appears well-developed and well-nourished. No distress.  HENT:  Head: Normocephalic and atraumatic.  Mouth/Throat: Oropharynx is clear and moist.  Eyes: Pupils are equal, round, and  reactive to light.  Neck: Normal range of motion. Neck supple.  Cardiovascular: Normal rate, regular rhythm and normal heart sounds. Exam reveals no gallop and no friction rub.  No murmur heard. Pulmonary/Chest: Effort normal and breath sounds normal. No respiratory distress. He has no wheezes.  Abdominal: Soft. Bowel sounds are normal. He exhibits no distension. There is generalized tenderness and tenderness in the epigastric area, periumbilical area, left upper quadrant and left lower quadrant. There is guarding. There is no rigidity.  Neurological: He is alert and oriented to person, place, and time. He exhibits normal muscle tone. Coordination normal.  Skin: Skin is warm and dry. Capillary refill takes less than 2 seconds. No rash noted. No erythema.  Psychiatric: He has a normal mood and affect. His behavior is normal.  Nursing note and vitals reviewed.    ED Treatments / Results  Labs (all labs ordered are listed, but only abnormal results are displayed) Labs Reviewed  COMPREHENSIVE METABOLIC PANEL - Abnormal; Notable for the following components:      Result Value   Potassium 5.2 (*)    CO2 21 (*)    Glucose, Bld 104 (*)    BUN 58 (*)    Creatinine, Ser 6.19 (*)    Calcium 7.6 (*)    Total Protein 6.0 (*)    Albumin 3.4 (*)    AST 12 (*)    GFR calc non  Af Amer 9 (*)    GFR calc Af Amer 10 (*)    All other components within normal limits  CBC - Abnormal; Notable for the following components:   RBC 2.94 (*)    Hemoglobin 9.4 (*)    HCT 29.0 (*)    RDW 16.4 (*)    All other components within normal limits  LIPASE, BLOOD  URINALYSIS, ROUTINE W REFLEX MICROSCOPIC    EKG None  Radiology No results found.  Procedures Procedures (including critical care time)  Medications Ordered in ED Medications  iopamidol (ISOVUE-300) 61 % injection (has no administration in time range)  ondansetron (ZOFRAN) injection 4 mg (4 mg Intravenous Given 02/19/18 0603)     Initial Impression / Assessment and Plan / ED Course  I have reviewed the triage vital signs and the nursing notes.  Pertinent labs & imaging results that were available during my care of the patient were reviewed by me and considered in my medical decision making (see chart for details).     Patient will need a CT scan to further evaluate his abdominal discomfort.  The patient is advised the plan and all questions were answered.  The patient has nausea currently and will be given medications for this.  At this point the patient's etiology of his abdominal pain is not totally clear without the CT scan results.  We will send the patient out to the oncoming PA-C. Rodell Perna  Final Clinical Impressions(s) / ED Diagnoses   Final diagnoses:  None    ED Discharge Orders    None       Dalia Heading, PA-C 02/19/18 3159    Rolland Porter, MD 02/19/18 [phone]

## 2018-02-19 NOTE — ED Triage Notes (Signed)
Pt here for nausea and abdominal pain and feeling tired.  Got a flu shot on Wednesday last week. A&Ox4.

## 2018-02-19 NOTE — Discharge Instructions (Addendum)
1. Medications: Take Zofran as needed for nausea.  Wait around 20 minutes before eating or drinking after taking this medication.  Take Prilosec as prescribed for reflux symptoms. 2. Treatment: rest, drink plenty of fluids, advance diet slowly.  Start with water and broth then advance to bland foods that will not upset your stomach such as crackers, mashed potatoes, and peanut butter.  Avoid fried foods, spicy foods, fatty foods, or alcohol. 3. Follow Up: Please followup with your primary doctor or nephrologist in 3 days for discussion of your diagnoses and further evaluation after today's visit; they should probably repeat your kidney function.  If you do not have a primary care doctor use the resource guide provided to find one; Please return to the ER for persistent vomiting, high fevers or worsening symptoms  Your CT scan today showed some patchiness in the lung bases.  The radiologist recommends obtaining a noncontrast CT in 4 weeks for further evaluation.

## 2018-02-19 NOTE — ED Notes (Signed)
Pt verbalized understanding of d/c instructions and has no further questions, VSS, NAD.  

## 2018-02-19 NOTE — ED Provider Notes (Signed)
Received patient at signout from [place] lawyer.  Refer to provider note for full history and physical examination.  Briefly, patient is a 57 year old male with history of ESRD not currently on dialysis and multiple myeloma presenting with nausea since last night with abdominal pain on exam.  Creatinine chronically elevated, at patient's baseline today.  Awaiting CT abdomen pelvis without contrast.  If no acute surgical abnormalities, stable for discharge home with symptom medic management.  MDM  IMPRESSION: Chronic changes within the kidneys bilaterally.  Stable appearing lytic and sclerotic lesions within the pelvic bones primarily on the left.  Patchy nodular changes in the bases bilaterally. These are likely inflammatory in nature. Short-term followup is recommended with noncontrast chest CT in 4 weeks following appropriate therapy.    Informed patient of CT findings including recommend patient to follow-up in 4 weeks for noncontrast chest CT for reevaluation of patchy nodular changes in the bilateral lung bases.  On reevaluation the patient is resting comfortably no apparent distress.  He is tolerating p.o. food and fluids without difficulty.  Serial abdominal examinations remain benign.  No evidence of acute surgical abdominal pathology.  Stable for discharge home with Zofran and Prilosec for his nausea.  He describes symptoms consistent with reflux.  Recommend advancing diet slowly and follow-up with PCP or nephrologist for repeat creatinine.  Discussed strict ED return precautions. Pt verbalized understanding of and agreement with plan and is safe for discharge home at this time. Discussed with Dr. Dayna Barker who agrees with assessment and plan at this time.       Renita Papa, PA-C 02/19/18 1349    Rolland Porter, MD 02/20/18 [phone]

## 2018-02-25 ENCOUNTER — Encounter (HOSPITAL_COMMUNITY): Payer: Self-pay | Admitting: Emergency Medicine

## 2018-02-25 ENCOUNTER — Emergency Department (HOSPITAL_COMMUNITY)
Admission: EM | Admit: 2018-02-25 | Discharge: 2018-02-25 | Disposition: A | Discharge status: Home / Self Care | Payer: Medicaid Other | Attending: Emergency Medicine | Admitting: Emergency Medicine

## 2018-02-25 ENCOUNTER — Emergency Department (HOSPITAL_COMMUNITY): Payer: Medicaid Other

## 2018-02-25 ENCOUNTER — Other Ambulatory Visit: Payer: Self-pay

## 2018-02-25 DIAGNOSIS — N185 Chronic kidney disease, stage 5: Secondary | ICD-10-CM | POA: Diagnosis not present

## 2018-02-25 DIAGNOSIS — R609 Edema, unspecified: Secondary | ICD-10-CM | POA: Diagnosis not present

## 2018-02-25 DIAGNOSIS — Z79899 Other long term (current) drug therapy: Secondary | ICD-10-CM | POA: Insufficient documentation

## 2018-02-25 DIAGNOSIS — I120 Hypertensive chronic kidney disease with stage 5 chronic kidney disease or end stage renal disease: Secondary | ICD-10-CM | POA: Insufficient documentation

## 2018-02-25 LAB — BASIC METABOLIC PANEL
Anion gap: 10 (ref 5–15)
BUN: 51 mg/dL — ABNORMAL HIGH (ref 6–20)
CHLORIDE: 106 mmol/L (ref 98–111)
CO2: 22 mmol/L (ref 22–32)
CREATININE: 5.73 mg/dL — AB (ref 0.61–1.24)
Calcium: 7.4 mg/dL — ABNORMAL LOW (ref 8.9–10.3)
GFR calc Af Amer: 11 mL/min — ABNORMAL LOW (ref >60)
GFR calc non Af Amer: 10 mL/min — ABNORMAL LOW (ref >60)
GLUCOSE: 99 mg/dL (ref 70–99)
POTASSIUM: 4.8 mmol/L (ref 3.5–5.1)
Sodium: 138 mmol/L (ref 135–145)

## 2018-02-25 LAB — I-STAT TROPONIN, ED: Troponin i, poc: 0.02 ng/mL (ref 0.00–0.08)

## 2018-02-25 LAB — CBC
HCT: 29.8 % — ABNORMAL LOW (ref 39.0–52.0)
Hemoglobin: 9.5 g/dL — ABNORMAL LOW (ref 13.0–17.0)
MCH: 31.6 pg (ref 26.0–34.0)
MCHC: 31.9 g/dL (ref 30.0–36.0)
MCV: 99 fL (ref 78.0–100.0)
PLATELETS: 182 10*3/uL (ref 150–400)
RBC: 3.01 MIL/uL — ABNORMAL LOW (ref 4.22–5.81)
RDW: 16 % — AB (ref 11.5–15.5)
WBC: 8.3 10*3/uL (ref 4.0–10.5)

## 2018-02-25 MED ORDER — FUROSEMIDE 10 MG/ML IJ SOLN
100.0000 mg | Freq: Once | INTRAMUSCULAR | Status: AC
  Administered 2018-02-25: 100 mg via INTRAVENOUS
  Filled 2018-02-25: qty 12

## 2018-02-25 NOTE — ED Triage Notes (Signed)
C/o bilateral lower extremity edema and SOB since Friday.  Pt believes steroid he is taking is causing him to retain fluid.

## 2018-02-25 NOTE — Discharge Instructions (Addendum)
Take your lasix twice a day for the next 2-3 days then back down to once a day. Talk to your doctor about getting tested for sleep apnea. You need to elevate your legs so help control the swelling.

## 2018-02-25 NOTE — ED Provider Notes (Signed)
[facility] EMERGENCY DEPARTMENT Provider Note   CSN: [id number] Arrival date & time: 02/25/18  0108  Time seen 02:34 AM   History   Chief Complaint Chief Complaint  Patient presents with  . Leg Swelling    HPI [name] is a 57 y.o. male.  HPI patient states he is getting chemotherapy for multiple myeloma and he has been on steroids since February.  He states he has noted that when he lays down flat at night to sleep in the bed the swelling will go away.  However he states recently he has been staying in a place where he does not have a bed and he has to sit up to sleep at night.  He states his legs have been swelling.  He was on Lasix 20 mg a day and now he is on Lasix 40 mg a day.  He states he has dyspnea on exertion and feels like he has been having wheezing.  He is never used an inhaler before and he relates the wheezing to being on the steroids.  He states he used to be on dialysis but he has been off now for 7 years.  He denies any prior history of asthma or COPD.  He states he did not take his fluid pills today.  Patient asked me repeatedly if I have a recliner he can use.  She denies chest pain, fever, but states he has had a dry cough.  PCP Ardeth Sportsman., MD   Past Medical History:  Diagnosis Date  . Arthritis   . AV (arteriovenous fistula) (HCC)    currently left upper arm  . 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 CKD (chronic kidney disease), stage V [facility]) nephrologist-  dr Cay Schillings in [facility]  [phone])   first dx 2009  --  hemodialysis 2009 until 2013 then was being monitored  . GERD (gastroesophageal reflux disease)   . Gout    per pt stable as of 02-18-2016  . History of decubitus ulcer    2009 sacral-- resolved  . History of sepsis    01-09-2016 pyelonephrolitis   . History of small bowel obstruction    01-09-2016  partial sbo resolved without surgical intervention  . Hypertension   . Lesion of right native  ureter   . Multiple myeloma [facility]) dx  01/ 2009 by dr Beryle Beams  current oncologist in [facility],  dr vc  Cruzita Lederer   Stage IV IgG Kappa , involving bone extensively --  pallitive radiation to left hip and thoracic spine and oral chemo  . Obstruction of right ureter   . Steroid-induced diabetes [facility])     Patient Active Problem List   Diagnosis Date Noted  . Volume overload 01/17/2018  . CKD (chronic kidney disease), stage V ([place]) 01/09/2016  . Hyperkalemia 01/09/2016  . Multiple myeloma ([place])   . Hypertension   . Diabetes mellitus without complication [facility])     Past Surgical History:  Procedure Laterality Date  . BONE MARROW BIOPSY  04/2018  . CYSTOSCOPY WITH STENT PLACEMENT Right 01/16/2016   Procedure: CYSTOSCOPY WITH STENT PLACEMENT;  Surgeon: Raynelle Bring, MD;  Location: [facility];  Service: Urology;  Laterality: Right;  . CYSTOSCOPY WITH URETEROSCOPY AND STENT PLACEMENT Right 02/21/2016   Procedure: CYSTOSCOPY WITH RIGHT URETEROSCOPY, RIGHT RGP, RIGHT URETERAL BIOPSY  AND STENT PLACEMENT;  Surgeon: Kathie Rhodes, MD;  Location: [facility];  Service: Urology;  Laterality: Right;  .  DIALYSIS FISTULA CREATION  last one 07-10-2007  dr Scot Dock   left upper extremity  . KNEE ARTHROSCOPY Left 1999  . LAPAROSCOPIC CHOLECYSTECTOMY  12-31-2015   [facility]  . PORTA CATH INSERTION  06/2017  . TRANSTHORACIC ECHOCARDIOGRAM  05/29/2007   mild LVH, ef 55%, abnormal septal motion/  mild AV calcification without stenosis/  trivial MR/  mild LAE/  mild TR        Home Medications    Prior to Admission medications   Medication Sig Start Date End Date Taking? Authorizing Provider  acyclovir (ZOVIRAX) 400 MG tablet Take 400 mg by mouth daily.   Yes [provider]  amLODipine (NORVASC) 2.5 MG tablet Take 2.5 mg by mouth daily.  05/11/15  Yes [provider]  B Complex-C-Folic Acid (NEPHRO-VITE PO) Take 1 tablet by mouth daily.    Yes [provider]  calcitRIOL (ROCALTROL) 0.5 MCG capsule Take 0.5-1 mcg by mouth See admin instructions. Take 1 capsule Monday thru Friday then take 2 capsules on Saturday and Sunday   Yes [provider]  cetirizine (ZYRTEC) 10 MG tablet Take 10 mg by mouth daily.   Yes [provider]  Coenzyme Q10 (COQ10) 200 MG CAPS Take 1 capsule by mouth daily.   Yes [provider]  dexamethasone (DECADRON) 4 MG tablet Take 12 mg by mouth once a week. On Wednesday   Yes [provider]  doxazosin (CARDURA) 4 MG tablet Take 4 mg by mouth at bedtime.   Yes [provider]  escitalopram (LEXAPRO) 20 MG tablet Take 20 mg by mouth daily.   Yes [provider]  furosemide (LASIX) 20 MG tablet Take 3 tablets (60 mg total) by mouth daily. 01/19/18 05/19/18 Yes Asencion Noble, MD  magnesium oxide (MAG-OX) 400 MG tablet Take 1 tablet (400 mg total) by mouth 2 (two) times daily. 11/12/15  Yes Pisciotta, Elmyra Ricks, PA-C  metoprolol succinate (TOPROL-XL) 100 MG 24 hr tablet Take 100 mg by mouth daily. Take with or immediately following a meal.    Yes [provider]  omeprazole (PRILOSEC) 20 MG capsule Take 1 capsule (20 mg total) by mouth daily. 02/19/18  Yes Fawze, Mina A, PA-C  ondansetron (ZOFRAN) 4 MG tablet Take 1 tablet (4 mg total) by mouth every 8 (eight) hours as needed for nausea or vomiting. 02/19/18  Yes Fawze, Mina A, PA-C  senna (SENOKOT) 8.6 MG tablet Take 1 tablet by mouth every evening.    Yes [provider]  sodium bicarbonate 650 MG tablet Take 2 tablets (1,300 mg total) by mouth 4 (four) times daily. Patient taking differently: Take 1,300 mg by mouth 2 (two) times daily.  11/12/15  Yes Pisciotta, Elmyra Ricks, PA-C  ondansetron (ZOFRAN ODT) 4 MG disintegrating tablet Take 1 tablet (4 mg total) by mouth every 8 (eight) hours as needed for nausea or vomiting. Patient not taking: Reported on 02/19/2018 11/12/15   Pisciotta, Elmyra Ricks, PA-C    phenazopyridine (PYRIDIUM) 200 MG tablet Take 1 tablet (200 mg total) by mouth 3 (three) times daily as needed for pain. Patient not taking: Reported on 01/17/2018 02/21/16   Kathie Rhodes, MD    Family History Family History  Problem Relation Age of Onset  . Hypertension Mother   . Hypertension Father   . Lung cancer Father   . Multiple myeloma Brother   . Cervical cancer Sister     Social History Social History   Tobacco Use  . Smoking status: Never  Smoker  . Smokeless tobacco: Never Used  Substance Use Topics  . Alcohol use: No  . Drug use: No     Allergies   Tape   Review of Systems Review of Systems  All other systems reviewed and are negative.    Physical Exam Updated Vital Signs BP (!) 151/86   Pulse 84   Temp 97.9 F (36.6 C) (Oral)   Resp 15   SpO2 97%   Physical Exam  Constitutional: He is oriented to person, place, and time. He appears well-developed and well-nourished.  Non-toxic appearance. He does not appear ill. No distress.  HENT:  Head: Normocephalic and atraumatic.  Right Ear: External ear normal.  Left Ear: External ear normal.  Nose: Nose normal. No mucosal edema or rhinorrhea.  Mouth/Throat: Oropharynx is clear and moist and mucous membranes are normal. No dental abscesses or uvula swelling.  Eyes: Pupils are equal, round, and reactive to light. Conjunctivae and EOM are normal.  Neck: Normal range of motion and full passive range of motion without pain. Neck supple.  Cardiovascular: Normal rate, regular rhythm and normal heart sounds. Exam reveals no gallop and no friction rub.  No murmur heard. Pulmonary/Chest: Effort normal. No respiratory distress. He has decreased breath sounds. He has no wheezes. He has no rhonchi. He has no rales. He exhibits no tenderness and no crepitus.  Abdominal: Soft. Normal appearance and bowel sounds are normal. He exhibits no distension. There is no tenderness. There is no rebound and no guarding.   Musculoskeletal: Normal range of motion. He exhibits edema. He exhibits no tenderness.  Moves all extremities well. 1+ to knee bilaterally, no redness or warmth  Neurological: He is alert and oriented to person, place, and time. He has normal strength. No cranial nerve deficit.  Skin: Skin is warm, dry and intact. No rash noted. No erythema. No pallor.  Psychiatric: He has a normal mood and affect. His speech is normal and behavior is normal. His mood appears not anxious.  Nursing note and vitals reviewed.    ED Treatments / Results  Labs (all labs ordered are listed, but only abnormal results are displayed) Results for orders placed or performed during the hospital encounter of 97/53/00  Basic metabolic panel  Result Value Ref Range   Sodium 138 135 - 145 mmol/L   Potassium 4.8 3.5 - 5.1 mmol/L   Chloride 106 98 - 111 mmol/L   CO2 22 22 - 32 mmol/L   Glucose, Bld 99 70 - 99 mg/dL   BUN 51 (H) 6 - 20 mg/dL   Creatinine, Ser 5.73 (H) 0.61 - 1.24 mg/dL   Calcium 7.4 (L) 8.9 - 10.3 mg/dL   GFR calc non Af Amer 10 (L) >60 mL/min   GFR calc Af Amer 11 (L) >60 mL/min   Anion gap 10 5 - 15  CBC  Result Value Ref Range   WBC 8.3 4.0 - 10.5 K/uL   RBC 3.01 (L) 4.22 - 5.81 MIL/uL   Hemoglobin 9.5 (L) 13.0 - 17.0 g/dL   HCT 29.8 (L) 39.0 - 52.0 %   MCV 99.0 78.0 - 100.0 fL   MCH 31.6 26.0 - 34.0 pg   MCHC 31.9 30.0 - 36.0 g/dL   RDW 16.0 (H) 11.5 - 15.5 %   Platelets 182 150 - 400 K/uL  I-stat troponin, ED  Result Value Ref Range   Troponin i, poc 0.02 0.00 - 0.08 ng/mL   Comment 3  Laboratory interpretation all normal except stable chronic renal failure, stable anemia     EKG EKG Interpretation  Date/Time:  Monday February 25 2018 01:32:24 EDT Ventricular Rate:  84 PR Interval:  180 QRS Duration: 106 QT Interval:  366 QTC Calculation: 432 R Axis:   43 Text Interpretation:  Normal sinus rhythm Cannot rule out Anterior infarct , age undetermined No  significant change since last tracing 17 Jan 2018 Confirmed by Rolland Porter [phone]) on 02/25/2018 2:26:25 AM   Radiology Dg Chest 2 View  Result Date: 02/25/2018 CLINICAL DATA:  Lower extremity edema, shortness of breath and wheezing for 3 days. EXAM: CHEST - 2 VIEW COMPARISON:  Chest radiograph January 29, 2018 FINDINGS: Stable cardiomegaly. Mediastinal silhouette is not suspicious. Mild bronchitic changes without pleural effusion or focal consolidation. No pneumothorax. Single-lumen RIGHT chest Port-A-Cath with distal tip mid superior vena cava. No pneumothorax. Soft tissue planes and included osseous structures are non suspicious. IMPRESSION: Stable cardiomegaly and mild bronchitic changes. Electronically Signed   By: Elon Alas M.D.   On: 02/25/2018 01:52   Ct Abdomen Pelvis Wo Contrast  Result Date: 02/19/2018 CLINICAL DATA:  Generalized abdominal pain weakness following flu shot EXAM: CT ABDOMEN AND PELVIS WITHOUT CONTRAST TECHNIQUE: Multidetector CT imaging of the abdomen and pelvis was performed following the standard protocol without IV contrast. COMPARISON:  Chest x-ray from 01/29/2018, PET-CT from 05/14/2017 IMPRESSION: Chronic changes within the kidneys bilaterally. Stable appearing lytic and sclerotic lesions within the pelvic bones primarily on the left. Patchy nodular changes in the bases bilaterally. These are likely inflammatory in nature. Short-term followup is recommended with noncontrast chest CT in 4 weeks following appropriate therapy. Electronically Signed   By: Inez Catalina M.D.   On: 02/19/2018 09:06    Procedures Procedures (including critical care time)  Medications Ordered in ED Medications  furosemide (LASIX) injection 100 mg (100 mg Intravenous Given 02/25/18 0320)     Initial Impression / Assessment and Plan / ED Course  I have reviewed the triage vital signs and the nursing notes.  Pertinent labs & imaging results that were available during my care of the  patient were reviewed by me and considered in my medical decision making (see chart for details).     Patient did not have wheezing on my exam, I offered him a nebulizer treatment which he refused.  I offered patient Lasix, again he keeps asking me if I have a recliner.  He did finally decide to take IV Lasix, it was offered to be given IM.  Due to his chronic renal disease he was given a higher dose.  Patient states he does not like diuretics but unfortunately that is what he needs right now.  The swelling in his legs appears to be mechanical due to the fact that he does not lay down and elevate his legs, he is sleeping sitting in a chair.  05:50 AM patient sleeping and snoring, pulse ox dropped into the 80's while sleeping, when awakened it quickly rises into the normal range.  He states he has never been evaluated for sleep apnea.  We discussed he should discuss this with his family doctor.  Patient states he has had 4 episodes of urinary output.  He states his legs feel less tight.  At this point I think patient can be discharged home.  Final Clinical Impressions(s) / ED Diagnoses   Final diagnoses:  Peripheral edema    ED Discharge Orders    None  Plan discharge  Rolland Porter, MD, Barbette Or, MD 02/25/18 [phone]

## 2018-10-03 ENCOUNTER — Encounter: Payer: Self-pay | Admitting: General Practice

## 2018-10-03 NOTE — Progress Notes (Signed)
[facility] Spiritual Care Note  Follow Rach[place] through [facility]. Mailing handwritten note of encouragement with [employer] online programming information as a check-in while groups are suspended during covid-19.   [place], [place], [facility] Pager [phone] Voicemail [phone]

## 2024-03-23 DIAGNOSIS — I4891 Unspecified atrial fibrillation: Secondary | ICD-10-CM
